# Patient Record
Sex: Female | Born: 1960 | Race: White | Hispanic: No | Smoking: Current some day smoker
Health system: Southern US, Community
[De-identification: ages and names within clinical notes are randomized; demographics above are authoritative.]

## PROBLEM LIST (undated history)

## (undated) DIAGNOSIS — Z972 Presence of dental prosthetic device (complete) (partial): Secondary | ICD-10-CM

## (undated) DIAGNOSIS — M25569 Pain in unspecified knee: Secondary | ICD-10-CM

## (undated) DIAGNOSIS — G8929 Other chronic pain: Secondary | ICD-10-CM

## (undated) DIAGNOSIS — M1990 Unspecified osteoarthritis, unspecified site: Secondary | ICD-10-CM

## (undated) DIAGNOSIS — F329 Major depressive disorder, single episode, unspecified: Secondary | ICD-10-CM

## (undated) DIAGNOSIS — K219 Gastro-esophageal reflux disease without esophagitis: Secondary | ICD-10-CM

## (undated) DIAGNOSIS — E785 Hyperlipidemia, unspecified: Secondary | ICD-10-CM

## (undated) DIAGNOSIS — Z973 Presence of spectacles and contact lenses: Secondary | ICD-10-CM

## (undated) DIAGNOSIS — F32A Depression, unspecified: Secondary | ICD-10-CM

## (undated) DIAGNOSIS — F419 Anxiety disorder, unspecified: Secondary | ICD-10-CM

## (undated) HISTORY — DX: Anxiety disorder, unspecified: F41.9

## (undated) HISTORY — DX: Major depressive disorder, single episode, unspecified: F32.9

## (undated) HISTORY — DX: Hyperlipidemia, unspecified: E78.5

## (undated) HISTORY — PX: TUBAL LIGATION: SHX77

## (undated) HISTORY — DX: Pain in unspecified knee: M25.569

## (undated) HISTORY — DX: Depression, unspecified: F32.A

## (undated) HISTORY — DX: Unspecified osteoarthritis, unspecified site: M19.90

---

## 2002-06-04 ENCOUNTER — Emergency Department (HOSPITAL_COMMUNITY): Admission: EM | Admit: 2002-06-04 | Discharge: 2002-06-04 | Payer: Self-pay | Admitting: Emergency Medicine

## 2002-06-04 ENCOUNTER — Encounter: Payer: Self-pay | Admitting: Emergency Medicine

## 2008-03-17 HISTORY — PX: TOTAL HIP ARTHROPLASTY: SHX124

## 2008-04-12 ENCOUNTER — Emergency Department (HOSPITAL_COMMUNITY): Admission: EM | Admit: 2008-04-12 | Discharge: 2008-04-12 | Payer: Self-pay | Admitting: Family Medicine

## 2010-08-05 ENCOUNTER — Inpatient Hospital Stay (INDEPENDENT_AMBULATORY_CARE_PROVIDER_SITE_OTHER)
Admission: RE | Admit: 2010-08-05 | Discharge: 2010-08-05 | Disposition: A | Discharge status: Home / Self Care | Payer: Medicaid Other | Source: Ambulatory Visit | Attending: Family Medicine | Admitting: Family Medicine

## 2010-08-05 DIAGNOSIS — J40 Bronchitis, not specified as acute or chronic: Secondary | ICD-10-CM

## 2010-08-16 ENCOUNTER — Inpatient Hospital Stay (INDEPENDENT_AMBULATORY_CARE_PROVIDER_SITE_OTHER)
Admission: RE | Admit: 2010-08-16 | Discharge: 2010-08-16 | Disposition: A | Discharge status: Home / Self Care | Payer: Medicaid Other | Source: Ambulatory Visit | Attending: Family Medicine | Admitting: Family Medicine

## 2010-08-16 DIAGNOSIS — Z760 Encounter for issue of repeat prescription: Secondary | ICD-10-CM

## 2010-08-16 DIAGNOSIS — M129 Arthropathy, unspecified: Secondary | ICD-10-CM

## 2010-09-24 ENCOUNTER — Other Ambulatory Visit: Payer: Self-pay | Admitting: Family Medicine

## 2010-09-24 DIAGNOSIS — M25512 Pain in left shoulder: Secondary | ICD-10-CM

## 2010-09-28 ENCOUNTER — Other Ambulatory Visit: Payer: Medicaid Other

## 2010-12-17 ENCOUNTER — Encounter: Payer: Medicaid Other | Attending: Physical Medicine & Rehabilitation | Admitting: Physical Medicine & Rehabilitation

## 2010-12-17 DIAGNOSIS — M7530 Calcific tendinitis of unspecified shoulder: Secondary | ICD-10-CM

## 2010-12-17 DIAGNOSIS — M25519 Pain in unspecified shoulder: Secondary | ICD-10-CM | POA: Insufficient documentation

## 2010-12-17 DIAGNOSIS — G8929 Other chronic pain: Secondary | ICD-10-CM | POA: Insufficient documentation

## 2010-12-17 DIAGNOSIS — M542 Cervicalgia: Secondary | ICD-10-CM | POA: Insufficient documentation

## 2010-12-17 DIAGNOSIS — IMO0001 Reserved for inherently not codable concepts without codable children: Secondary | ICD-10-CM

## 2010-12-17 DIAGNOSIS — M19019 Primary osteoarthritis, unspecified shoulder: Secondary | ICD-10-CM | POA: Insufficient documentation

## 2010-12-17 DIAGNOSIS — M1610 Unilateral primary osteoarthritis, unspecified hip: Secondary | ICD-10-CM

## 2010-12-17 DIAGNOSIS — M25559 Pain in unspecified hip: Secondary | ICD-10-CM | POA: Insufficient documentation

## 2010-12-17 DIAGNOSIS — Z981 Arthrodesis status: Secondary | ICD-10-CM | POA: Insufficient documentation

## 2010-12-20 ENCOUNTER — Ambulatory Visit (HOSPITAL_COMMUNITY)
Admission: RE | Admit: 2010-12-20 | Discharge: 2010-12-20 | Disposition: A | Discharge status: Home / Self Care | Payer: Medicaid Other | Source: Ambulatory Visit | Attending: Physical Medicine & Rehabilitation | Admitting: Physical Medicine & Rehabilitation

## 2010-12-20 ENCOUNTER — Other Ambulatory Visit: Payer: Self-pay | Admitting: Physical Medicine & Rehabilitation

## 2010-12-20 DIAGNOSIS — R52 Pain, unspecified: Secondary | ICD-10-CM

## 2010-12-20 DIAGNOSIS — Z96649 Presence of unspecified artificial hip joint: Secondary | ICD-10-CM | POA: Insufficient documentation

## 2010-12-20 DIAGNOSIS — M19019 Primary osteoarthritis, unspecified shoulder: Secondary | ICD-10-CM | POA: Insufficient documentation

## 2011-01-13 ENCOUNTER — Encounter: Payer: Medicaid Other | Attending: Neurosurgery | Admitting: Neurosurgery

## 2011-01-13 DIAGNOSIS — IMO0001 Reserved for inherently not codable concepts without codable children: Secondary | ICD-10-CM | POA: Insufficient documentation

## 2011-01-13 DIAGNOSIS — M25529 Pain in unspecified elbow: Secondary | ICD-10-CM

## 2011-01-13 DIAGNOSIS — M542 Cervicalgia: Secondary | ICD-10-CM | POA: Insufficient documentation

## 2011-01-13 DIAGNOSIS — Z96649 Presence of unspecified artificial hip joint: Secondary | ICD-10-CM | POA: Insufficient documentation

## 2011-01-13 DIAGNOSIS — M19019 Primary osteoarthritis, unspecified shoulder: Secondary | ICD-10-CM | POA: Insufficient documentation

## 2011-01-13 DIAGNOSIS — M25559 Pain in unspecified hip: Secondary | ICD-10-CM | POA: Insufficient documentation

## 2011-01-13 DIAGNOSIS — M25519 Pain in unspecified shoulder: Secondary | ICD-10-CM | POA: Insufficient documentation

## 2011-01-14 NOTE — Assessment & Plan Note (Signed)
This is patient of Dr. Riley Kill, is a new patient that he has started on hydrocodone.  She is reporting very little improvement with that.  Her last appointment 2 weeks ago with him, he did a Kenalog injection in the shoulder as well as some trigger point injections.  She came back today wanting another shoulder injection.  I explained to her that it was a little soon to inject the shoulder again as far as the joint injection, we could do some trigger points.  She reports little or no improvement with either one, but he did.  She is also concerned about her hip pain. She rates her average pain as 6-7.  It is a sharp, burning, stabbing, aching pain.  General activity level is 8-10.  Pain is same 24 hours a day.  She does not indicate what aggravates or helps her pain.  She uses a cane for ambulation.  She climb steps and drives.  Functionally, there has been no change.  REVIEW OF SYSTEMS:  She does not indicate any problems other than ones described above.  Past medical history, social history, family history unchanged.  PHYSICAL EXAMINATION:  VITAL SIGNS:  Blood pressure is 120/83, pulse 70 ,respirations 18, O2 sats 98 on room air. MUSCULOSKELETAL:  Lower extremity strength is about 4/5.  Resistance testing, sensation is intact.  Her left shoulder, gives away the pain in every direction, passive range of motion because of pain.  ASSESSMENT: 1. 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 2. Left shoulder pain, questionable rotator cuff tear or bursitis.     She did get x-rays that were fairly benign of the shoulder with the     exception of some mild osteoarthritis.  She has myofascial pain in     cervical spine all the way over to her left shoulder.  She is point     tender everywhere she was touched.  PLAN:  No prescriptions were given, but after informed consent and proper time-out, Betadine prepped and alcohol cleaned, the rhomboids just left to the cervical thoracic junction  and an area just subscapular on the left side.  I injected 4 areas with 1% lidocaine 1 mL each.  She tolerated it well.  There was no bleeding.  There was no shoulder injection.  Her questions were encouraged and answered.  She states she has an appointment with Dr. Riley Kill a little over 2 weeks and she will follow up with him and for possible referral to Orthopedics.     Caydn Justen L. Blima Dessert Electronically Signed    RLW/MedQ D:  01/13/2011 15:57:47  T:  01/14/2011 01:39:23  Job #:  [id number]

## 2011-01-28 ENCOUNTER — Encounter: Payer: Medicaid Other | Attending: Physical Medicine & Rehabilitation | Admitting: Physical Medicine & Rehabilitation

## 2011-01-28 DIAGNOSIS — M542 Cervicalgia: Secondary | ICD-10-CM | POA: Insufficient documentation

## 2011-01-28 DIAGNOSIS — M19029 Primary osteoarthritis, unspecified elbow: Secondary | ICD-10-CM

## 2011-01-28 DIAGNOSIS — M25559 Pain in unspecified hip: Secondary | ICD-10-CM | POA: Insufficient documentation

## 2011-01-28 DIAGNOSIS — M7530 Calcific tendinitis of unspecified shoulder: Secondary | ICD-10-CM

## 2011-01-28 DIAGNOSIS — F411 Generalized anxiety disorder: Secondary | ICD-10-CM | POA: Insufficient documentation

## 2011-01-28 DIAGNOSIS — F3289 Other specified depressive episodes: Secondary | ICD-10-CM | POA: Insufficient documentation

## 2011-01-28 DIAGNOSIS — M25519 Pain in unspecified shoulder: Secondary | ICD-10-CM | POA: Insufficient documentation

## 2011-01-28 DIAGNOSIS — M67919 Unspecified disorder of synovium and tendon, unspecified shoulder: Secondary | ICD-10-CM | POA: Insufficient documentation

## 2011-01-28 DIAGNOSIS — M719 Bursopathy, unspecified: Secondary | ICD-10-CM | POA: Insufficient documentation

## 2011-01-28 DIAGNOSIS — F329 Major depressive disorder, single episode, unspecified: Secondary | ICD-10-CM | POA: Insufficient documentation

## 2011-01-28 DIAGNOSIS — IMO0001 Reserved for inherently not codable concepts without codable children: Secondary | ICD-10-CM | POA: Insufficient documentation

## 2011-01-28 DIAGNOSIS — M47812 Spondylosis without myelopathy or radiculopathy, cervical region: Secondary | ICD-10-CM

## 2011-01-28 DIAGNOSIS — M19019 Primary osteoarthritis, unspecified shoulder: Secondary | ICD-10-CM | POA: Insufficient documentation

## 2011-01-28 NOTE — Assessment & Plan Note (Signed)
Summer Estrada is back regarding multiple pain complaints.  She saw my nurse practitioner last month who injected the rhomboids on the left side with trigger point injections.  She had minimal results with these.  She did state that she had positive results with the trigger point injection at the supraspinous and the left subacromial injection we performed at the beginning of the month.  She had her x-rays done and left hip replacement was intact.  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  Pain today is 8 to 9/10 and most prominent in the neck into the upper shoulder region.  She has lot of pain anteriorly in the left shoulder. Neck is most painful with extension.  Sleep is fair.  Denies any tingling or numbness in the arms.  Left hip is still tender with prolonged sitting.  REVIEW OF SYSTEMS:  Notable for depression and anxiety.  Full 12-point review is in the written health and history section of the chart.  She also states the Lortab causes nausea and she could continue this.  SOCIAL HISTORY:  Unchanged.  She still smokes.  She lives with husband who travels.  PHYSICAL EXAMINATION:  Blood pressure is 145/83, pulse 81, respiratory rate 18, she is saturating 100% on room air.  The patient is pleasant, alert.  She has fairly intact strength in the upper extremities with some pain inhibition proximally at the left shoulder.  She has definite pain in the left acromioclavicular joint.  Rotator cuff testing was improved.  She had some tenderness at the left supraspinatus and trigger points palpated.  Cervical flexion did not cause pain where as extension and facet maneuvers did provoke pain on the left side.  Rhomboids were not as tender today.  Standing and sitting posture was fair.  She did have some problems with left hip pain in sitting as well as left low back pain.  Left low back was generally tender to touch today with diminished flexion and  extension.  ASSESSMENT: 1. 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  There is likely a cervical     facet component as well.  Fascial pain is also a problem there. 2. Persistent left hip pain.  PLAN: 1. We will set the patient up for MRI of the neck to the examin her     disks and facets. 2. After informed consent, we injected the left AC joint with 40 mg of     Kenalog and 2 mL of 1% lidocaine.  Area was prepped with betadine.     The patient tolerated it well.  I advised ice and continued use of     her meloxicam. 3. We discontinued Lortab and trial of Ultram 50 mg 1 q.6 h. p.r.n.,     #60 for breakthrough pain. 4. Injected the left supraspinatus trigger point today after informed     consent and prep, the patient tolerated well. 5. I will see the patient back here pending MRI study above.     Ranelle Oyster, M.D. Electronically Signed    ZTS/MedQ D:  01/28/2011 11:13:46  T:  01/28/2011 11:40:27  Job #:  [id number]  cc:   Maryelizabeth Rowan, M.D. Fax: [phone]

## 2011-02-12 ENCOUNTER — Other Ambulatory Visit: Payer: Self-pay | Admitting: Physical Medicine & Rehabilitation

## 2011-02-12 DIAGNOSIS — M47812 Spondylosis without myelopathy or radiculopathy, cervical region: Secondary | ICD-10-CM

## 2011-02-12 DIAGNOSIS — M542 Cervicalgia: Secondary | ICD-10-CM

## 2011-02-14 ENCOUNTER — Ambulatory Visit (HOSPITAL_COMMUNITY)
Admission: RE | Admit: 2011-02-14 | Discharge: 2011-02-14 | Disposition: A | Discharge status: Home / Self Care | Payer: Medicaid Other | Source: Ambulatory Visit | Attending: Physical Medicine & Rehabilitation | Admitting: Physical Medicine & Rehabilitation

## 2011-02-14 DIAGNOSIS — M47812 Spondylosis without myelopathy or radiculopathy, cervical region: Secondary | ICD-10-CM | POA: Insufficient documentation

## 2011-02-14 DIAGNOSIS — M542 Cervicalgia: Secondary | ICD-10-CM | POA: Insufficient documentation

## 2011-02-14 DIAGNOSIS — M4802 Spinal stenosis, cervical region: Secondary | ICD-10-CM | POA: Insufficient documentation

## 2011-02-14 DIAGNOSIS — M79609 Pain in unspecified limb: Secondary | ICD-10-CM | POA: Insufficient documentation

## 2011-02-14 DIAGNOSIS — M5020 Other cervical disc displacement, unspecified cervical region: Secondary | ICD-10-CM | POA: Insufficient documentation

## 2011-03-12 ENCOUNTER — Encounter: Payer: Medicaid Other | Attending: Physical Medicine & Rehabilitation | Admitting: Physical Medicine & Rehabilitation

## 2011-03-28 ENCOUNTER — Encounter: Payer: Medicaid Other | Attending: Physical Medicine & Rehabilitation | Admitting: Physical Medicine & Rehabilitation

## 2011-03-28 DIAGNOSIS — M5020 Other cervical disc displacement, unspecified cervical region: Secondary | ICD-10-CM | POA: Insufficient documentation

## 2011-03-28 DIAGNOSIS — M542 Cervicalgia: Secondary | ICD-10-CM | POA: Insufficient documentation

## 2011-03-28 DIAGNOSIS — M1610 Unilateral primary osteoarthritis, unspecified hip: Secondary | ICD-10-CM

## 2011-03-28 DIAGNOSIS — M47812 Spondylosis without myelopathy or radiculopathy, cervical region: Secondary | ICD-10-CM

## 2011-03-28 DIAGNOSIS — M25559 Pain in unspecified hip: Secondary | ICD-10-CM | POA: Insufficient documentation

## 2011-03-28 DIAGNOSIS — M7530 Calcific tendinitis of unspecified shoulder: Secondary | ICD-10-CM

## 2011-03-28 DIAGNOSIS — M719 Bursopathy, unspecified: Secondary | ICD-10-CM | POA: Insufficient documentation

## 2011-03-28 DIAGNOSIS — M67919 Unspecified disorder of synovium and tendon, unspecified shoulder: Secondary | ICD-10-CM | POA: Insufficient documentation

## 2011-03-29 NOTE — Assessment & Plan Note (Signed)
Rodneisha is back regarding her multiple pain complaints.  We had an MRI ordered of her neck at last visit, which should spondylosis predominantly at the C4 through C7.  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  There is no definite nerve root impingement, however.  The injection we performed at the [facility] joint was helpful for anterior shoulder pain, but she is still having neck and shoulder blade pain and pain into the lateral arm.  It seems to be most painful with activities, particularly walking, standing and overhead use of the hands and arms.  She rates her pain 8-10/10. The tramadol was not helpful for breakthrough symptoms.  She is not taking a muscle relaxant at this point.  Apparently, she did not get the prescription.  REVIEW OF SYSTEMS:  Notable for the above.  Full 12-point review is in the written health and history section of the chart.  SOCIAL HISTORY:  Unchanged.  PHYSICAL EXAMINATION:  VITAL SIGNS:  Blood pressure is 140/84, pulse 85, respiratory rate is 16 and she is satting 100% on room air. GENERAL:  The patient is pleasant and alert. BACK:  Left shoulder is tender with rotator cuff impingement maneuvers. Labral testing Raiford Noble some pain as well and she had pain at the first subacromial bursa.  Strength is generally intact.  Spurling test was equivocal to negative and really did not provoke the complaints that she is having.  Facet provocative maneuver seemed to be more positive today. Posture was fair. HEART:  Regular. CHEST:  Clear. ABDOMEN:  Soft and nontender.  ASSESSMENT: 1. Chronic cervicalgia with rotator cuff syndrome. 2. Cervical spondylosis. 3. Persistent left hip pain.  PLAN: 1. I feel that the left rotator cuff is the biggest contributor to her     symptoms at this point although her C-spine is playing a role as     well.  She also may have some labral issues as well as bursitis     contributing as  well. After informed consent, we injected the left     shoulder via lateral approach with 40 mg of Kenalog and 3 mL of 1%     lidocaine.  The patient tolerated well. 2. We changed her Ultram to Norco 5/325, 1 q.6 h. p.r.n., #60. 3. We will try Robaxin 500 mg 1 q.8 h. p.r.n. #60. 4. Discussed range of motion exercises at home. 5. We will follow up with her in about a month to further determine     her treatment plan.  She might benefit from therapy to the left     shoulder also.  Consider MRI as well.     Ranelle Oyster, M.D. Electronically Signed    ZTS/MedQ D:  03/28/2011 10:13:07  T:  03/29/2011 02:07:23  Job #:  [id number]  cc:   Maryelizabeth Rowan, M.D. Fax: [phone]

## 2011-04-08 ENCOUNTER — Ambulatory Visit: Payer: Medicaid Other | Admitting: Physical Medicine & Rehabilitation

## 2011-04-28 ENCOUNTER — Encounter: Payer: Medicaid Other | Attending: Physical Medicine & Rehabilitation | Admitting: Physical Medicine & Rehabilitation

## 2011-04-28 DIAGNOSIS — M719 Bursopathy, unspecified: Secondary | ICD-10-CM | POA: Insufficient documentation

## 2011-04-28 DIAGNOSIS — M25569 Pain in unspecified knee: Secondary | ICD-10-CM | POA: Insufficient documentation

## 2011-04-28 DIAGNOSIS — M25519 Pain in unspecified shoulder: Secondary | ICD-10-CM | POA: Insufficient documentation

## 2011-04-28 DIAGNOSIS — M1710 Unilateral primary osteoarthritis, unspecified knee: Secondary | ICD-10-CM

## 2011-04-28 DIAGNOSIS — IMO0001 Reserved for inherently not codable concepts without codable children: Secondary | ICD-10-CM

## 2011-04-28 DIAGNOSIS — M542 Cervicalgia: Secondary | ICD-10-CM | POA: Insufficient documentation

## 2011-04-28 DIAGNOSIS — M47812 Spondylosis without myelopathy or radiculopathy, cervical region: Secondary | ICD-10-CM | POA: Insufficient documentation

## 2011-04-28 DIAGNOSIS — M7530 Calcific tendinitis of unspecified shoulder: Secondary | ICD-10-CM

## 2011-04-28 DIAGNOSIS — M67919 Unspecified disorder of synovium and tendon, unspecified shoulder: Secondary | ICD-10-CM | POA: Insufficient documentation

## 2011-04-28 NOTE — Assessment & Plan Note (Signed)
Summer Estrada is back regarding her neck pain and shoulder pain.  She had good results with the left subacromial injection.  Neck remains a problem, however.  Her pain is 0 to 4 out of 10, worse when she is at the computer for long periods of time.  She also has problems sleeping at night.  Sleep was rated as poor overall.  The Robaxin is not helping her, however, the hydrocodone seems to help with breakthrough symptoms.  REVIEW OF SYSTEMS:  Notable for the above.  Full 12-point review is in the written health and history section of the chart.  SOCIAL HISTORY:  Unchanged.  PHYSICAL EXAMINATION:  VITAL SIGNS:  Blood pressure is 106/56, pulse is 80, respiratory rate is 16, she is satting 98% on room air. GENERAL:  The patient is pleasant, alert. MUSCULOSKELETAL:  Posture is fair, though she sits with a head forward position.  The left rotator cuff was mildly tender with provocation today.  She had tenderness over the levator scapula, mid trapezius, and upper rhomboids on the left with palpation today.  There is significant tightness and tenderness in the mid trapezius.  Motor and sensory exam grossly intact in all 4 limbs.  Spurling test was equivocal.  Facet provocation was equivocal to positive. HEART:  Regular. CHEST:  Clear. ABDOMEN:  Soft, nontender.  ASSESSMENT: 1. Chronic cervicalgia with rotator cuff syndrome. 2. Left shoulder girdle myofascial pain. 3. Cervical spondylosis. 4. New onset, right knee pain.  PLAN: 1. After informed consent, I injected 3 trigger points including left     trapezius, levator scapula, and rhomboids each with 2 mL of 1%     lidocaine.  The patient tolerated well. 2. We will send her to therapy to work on posture modalities,     stretching, strengthening, etc.  She really needs to be proactive     regarding her posture and positioning. 3. Change Robaxin to [facility] for muscle relaxer. 4. She will stay with Norco for breakthrough pain. 5. I will see her  back here in about 2 month's time.  We will hold off     on any knee interventions at this point.     Ranelle Oyster, M.D. Electronically Signed    ZTS/MedQ D:  04/28/2011 12:22:00  T:  04/28/2011 23:12:21  Job #:  [id number]

## 2011-05-09 ENCOUNTER — Other Ambulatory Visit: Payer: Self-pay | Admitting: Physical Medicine & Rehabilitation

## 2011-05-09 ENCOUNTER — Ambulatory Visit: Payer: Medicaid Other | Admitting: Physical Therapy

## 2011-05-12 ENCOUNTER — Other Ambulatory Visit: Payer: Self-pay | Admitting: *Deleted

## 2011-05-12 MED ORDER — NAPROXEN 500 MG PO TABS: 500.0000 mg | ORAL_TABLET | Freq: Two times a day (BID) | ORAL | Status: DC

## 2011-05-14 ENCOUNTER — Ambulatory Visit
Payer: Medicaid Other | Attending: Physical Medicine & Rehabilitation | Admitting: Rehabilitative and Restorative Service Providers"

## 2011-05-14 DIAGNOSIS — M542 Cervicalgia: Secondary | ICD-10-CM | POA: Insufficient documentation

## 2011-05-14 DIAGNOSIS — IMO0001 Reserved for inherently not codable concepts without codable children: Secondary | ICD-10-CM | POA: Insufficient documentation

## 2011-05-14 DIAGNOSIS — M25519 Pain in unspecified shoulder: Secondary | ICD-10-CM | POA: Insufficient documentation

## 2011-05-16 ENCOUNTER — Telehealth: Payer: Self-pay | Admitting: Physical Medicine & Rehabilitation

## 2011-05-16 MED ORDER — HYDROCODONE-ACETAMINOPHEN 5-325 MG PO TABS: 1.0000 | ORAL_TABLET | Freq: Three times a day (TID) | ORAL | Status: DC | PRN

## 2011-05-16 NOTE — Telephone Encounter (Signed)
Rx called in. Pt aware

## 2011-05-16 NOTE — Telephone Encounter (Signed)
Started PT .  Needs hydrocodone refill.  Doesn't know if insurance will cover all of PT.

## 2011-05-21 ENCOUNTER — Encounter: Payer: Medicaid Other | Admitting: Physical Therapy

## 2011-05-23 ENCOUNTER — Encounter: Payer: Medicaid Other | Admitting: Physical Therapy

## 2011-05-26 ENCOUNTER — Telehealth: Payer: Self-pay | Admitting: Physical Medicine & Rehabilitation

## 2011-05-26 MED ORDER — OXYCODONE-ACETAMINOPHEN 5-325 MG PO TABS: 1.0000 | ORAL_TABLET | Freq: Three times a day (TID) | ORAL | Status: AC | PRN

## 2011-05-26 MED ORDER — TIZANIDINE HCL 4 MG PO TABS: 2.0000 mg | ORAL_TABLET | Freq: Four times a day (QID) | ORAL | Status: AC | PRN

## 2011-05-26 NOTE — Telephone Encounter (Signed)
Can you give her something else for pain?  It will just not go away now. Doesn't know what has happened, hasn't hurt herself.  What else can she do?

## 2011-05-26 NOTE — Telephone Encounter (Signed)
Added oxycodone and zanaflex.  Dc'ed robaxin and hydrocodone

## 2011-05-26 NOTE — Telephone Encounter (Signed)
Please advise. Pt is currently taking Hydrocodone and Robaxin.

## 2011-05-26 NOTE — Telephone Encounter (Signed)
Pt aware.

## 2011-05-28 ENCOUNTER — Ambulatory Visit
Payer: Medicaid Other | Attending: Physical Medicine & Rehabilitation | Admitting: Rehabilitative and Restorative Service Providers"

## 2011-05-28 DIAGNOSIS — M542 Cervicalgia: Secondary | ICD-10-CM | POA: Insufficient documentation

## 2011-05-28 DIAGNOSIS — M25519 Pain in unspecified shoulder: Secondary | ICD-10-CM | POA: Insufficient documentation

## 2011-05-28 DIAGNOSIS — IMO0001 Reserved for inherently not codable concepts without codable children: Secondary | ICD-10-CM | POA: Insufficient documentation

## 2011-06-04 ENCOUNTER — Ambulatory Visit: Payer: Medicaid Other | Admitting: Rehabilitative and Restorative Service Providers"

## 2011-06-17 ENCOUNTER — Telehealth: Payer: Self-pay | Admitting: *Deleted

## 2011-06-17 NOTE — Telephone Encounter (Signed)
I'm not sure if you were asking me what # means or what was the number ordered--which is #60 ordered on 05/26/11.  I placed in the med list, the tizanidine and oxycodone because it was not there in the chart, even though I could see you ordered it on 3/11.  FYI, if you place an order for a med, esp narcotic and there is an end date populated in the order, the medication will drop off their list when that date is reached.  The order you placed for oxy and zanaflex on 05/26/11 had an end date of 06/05/11 and did drop off her med list.  If you go to medications in the blue tabs on left and click the "history tab" above "prescription summary" , You can see that there was an end date.  If there is an end date populated when you order a med, you have to delete it, if med is ongoing.

## 2011-06-17 NOTE — Telephone Encounter (Signed)
What is

## 2011-06-17 NOTE — Telephone Encounter (Signed)
Refill Oxycodone. She knows she is out early.  (Was ordered Percocet 5/325  05/26/2011 #60, 1 q 8 hrs prn)

## 2011-06-18 MED ORDER — OXYCODONE-ACETAMINOPHEN 5-325 MG PO TABS: 1.0000 | ORAL_TABLET | Freq: Three times a day (TID) | ORAL | Status: DC | PRN

## 2011-06-18 NOTE — Telephone Encounter (Signed)
LVM for Summer Estrada informing her that her RX is available for pick up at front desk.

## 2011-06-18 NOTE — Telephone Encounter (Signed)
Thanks, done.

## 2011-06-23 ENCOUNTER — Encounter: Payer: Self-pay | Admitting: Physical Medicine & Rehabilitation

## 2011-06-23 ENCOUNTER — Encounter: Payer: Medicaid Other | Attending: Physical Medicine & Rehabilitation | Admitting: Physical Medicine & Rehabilitation

## 2011-06-23 ENCOUNTER — Ambulatory Visit (HOSPITAL_COMMUNITY)
Admission: RE | Admit: 2011-06-23 | Discharge: 2011-06-23 | Disposition: A | Discharge status: Home / Self Care | Payer: Medicaid Other | Source: Ambulatory Visit | Attending: Physical Medicine & Rehabilitation | Admitting: Physical Medicine & Rehabilitation

## 2011-06-23 VITALS — BP 108/66 | HR 70 | Resp 16 | Ht 63.0 in | Wt 180.0 lb

## 2011-06-23 DIAGNOSIS — M25469 Effusion, unspecified knee: Secondary | ICD-10-CM | POA: Insufficient documentation

## 2011-06-23 DIAGNOSIS — M47812 Spondylosis without myelopathy or radiculopathy, cervical region: Secondary | ICD-10-CM | POA: Insufficient documentation

## 2011-06-23 DIAGNOSIS — M170 Bilateral primary osteoarthritis of knee: Secondary | ICD-10-CM

## 2011-06-23 DIAGNOSIS — IMO0002 Reserved for concepts with insufficient information to code with codable children: Secondary | ICD-10-CM | POA: Insufficient documentation

## 2011-06-23 DIAGNOSIS — M7650 Patellar tendinitis, unspecified knee: Secondary | ICD-10-CM

## 2011-06-23 DIAGNOSIS — IMO0001 Reserved for inherently not codable concepts without codable children: Secondary | ICD-10-CM | POA: Insufficient documentation

## 2011-06-23 DIAGNOSIS — M1710 Unilateral primary osteoarthritis, unspecified knee: Secondary | ICD-10-CM | POA: Insufficient documentation

## 2011-06-23 DIAGNOSIS — M7530 Calcific tendinitis of unspecified shoulder: Secondary | ICD-10-CM | POA: Insufficient documentation

## 2011-06-23 MED ORDER — CARISOPRODOL 350 MG PO TABS: 350.0000 mg | ORAL_TABLET | Freq: Three times a day (TID) | ORAL | Status: DC | PRN

## 2011-06-23 NOTE — Patient Instructions (Signed)
ICE THREE X PER DAY TO KNEES AND USE VOLTAREN GEL 4 X PER DAY     Patellar Tendinitis, Jumper's Knee with Rehab Tendinitis is inflammation of a tendon. Tendonitis of the tendon below the kneecap (patella) is known as patellar tendonitis. Patellar tendonitis is a common cause of pain below the kneecap (infrapatella). Patellar tendonitis may involve a tear (strain) in the ligament. Strains are classified into three categories. Grade 1 strains cause pain, but the tendon is not lengthened. Grade 2 strains include a lengthened ligament, due to the ligament being stretched or partially ruptured. With grade 2 strains there is still function, although function may be decreased. Grade 3 strains involve a complete tear of the tendon or muscle, and function is usually impaired. Patellar tendon strains are usually grade 1 or 2.  SYMPTOMS   Pain, tenderness, swelling, warmth, or redness over the patellar tendon (just below the kneecap).   Pain and loss of strength (sometimes), with forcefully straightening the knee (especially when jumping or rising from a seated or squatting position), or bending the knee completely (squatting or kneeling).   Crackling sound (crepitation) when the tendon is moved or touched.  CAUSES  Patellar tendonitis is caused by injury to the patellar tendon. The inflammation is the body's healing response. Common causes of injury include:  Stress from a sudden increase in intensity, frequency, or duration of training.   Overuse of the quadriceps thigh muscles and patellar tendon.   Direct hit (trauma) to the knee or patellar tendon.  RISK INCREASES WITH:  Sports that require sudden, explosive thigh muscle (quadriceps) contraction, such as jumping, quick starts, or kicking.   Running sports, especially running down hills.   Poor strength and flexibility of the thigh and knee.   Flat feet.  PREVENTION  Warm up and stretch properly before activity.   Allow for adequate  recovery between workouts.   Maintain physical fitness:   Strength, flexibility, and endurance.   Cardiovascular fitness.   Protect the knee joint with taping, protective strapping, bracing, or elastic compression bandage.   Wear arch supports (orthotics).  PROGNOSIS  If treated properly, patellar tendonitis usually heals within 6 weeks.  RELATED COMPLICATIONS   Longer healing time, if not properly treated or if not given enough time to heal.   Recurring symptoms, if activity is resumed too soon, with overuse, with a direct blow, or when using poor technique.   If untreated, tendon rupture requiring surgery.  TREATMENT Treatment first involves the use of ice and medicine, to reduce pain and inflammation. The use of strengthening and stretching exercises may help reduce pain with activity. These exercises may be performed at home or with a therapist. Serious cases of tendonitis may require restraining the knee for 10 to 14 days, to prevent stress on the tendon and to promote healing. Crutches may be used (uncommon) until you can walk without a limp. For cases in which non-surgical treatment is unsuccessful, surgery may be advised, to remove the inflamed tendon lining (sheath). Surgery is rare, and is only advised after at least 6 months of non-surgical treatment. MEDICATION   If pain medicine is needed, nonsteroidal anti-inflammatory medicines (aspirin and ibuprofen), or other minor pain relievers (acetaminophen), are often advised.   Do not take pain medicine for 7 days before surgery.   Prescription pain relievers may be given, if your caregiver thinks they are needed. Use only as directed and only as much as you need.  HEAT AND COLD  Cold treatment (icing)  should be applied for 10 to 15 minutes every 2 to 3 hours for inflammation and pain, and immediately after activity that aggravates your symptoms. Use ice packs or an ice massage.   Heat treatment may be used before performing  stretching and strengthening activities prescribed by your caregiver, physical therapist, or athletic trainer. Use a heat pack or a warm water soak.  SEEK MEDICAL CARE IF:  Symptoms get worse or do not improve in 2 weeks, despite treatment.   New, unexplained symptoms develop. (Drugs used in treatment may produce side effects.)  EXERCISES RANGE OF MOTION (ROM) AND STRETCHING EXERCISES - Patellar Tendinitis (Jumper's Knee) These are some of the initial exercises with which you may start your rehabilitation program, until you see your caregiver again or until your symptoms are resolved. Remember:   Flexible tissue is more tolerant of the stresses placed on it during activities.   Each stretch should be held for 20 to 30 seconds.   A gentle stretching sensation should be felt.  STRETCH - Hamstrings, Supine  Lie on your back. Loop a belt or towel over the ball of your right / left foot.   Straighten your right / left knee and slowly pull on the belt to raise your leg. Do not allow the right / left knee to bend. Keep your opposite leg flat on the floor.   Raise the leg until you feel a gentle stretch behind your right / left knee or thigh. Hold this position for __________ seconds.  Repeat __________ times. Complete this stretch __________ times per day.  STRETCH - Hamstrings, Doorway  Lie on your back with your right / left leg extended and resting on the wall, and the opposite leg flat on the ground through the door. At first, position your bottom farther away from the wall.   Keep your right / left knee straight. If you feel a stretch behind your knee or thigh, hold this position for __________ seconds.   If you do not feel a stretch, scoot your bottom closer to the door, and hold __________ seconds.  Repeat __________ times. Complete this stretch __________ times per day.  STRETCH - Hamstrings, Standing  Stand or sit and extend your right / left leg, placing your foot on a chair or  foot stool.   Keep a slight arch in your low back and your hips straight forward.   Lead with your chest and lean forward at the waist until you feel a gentle stretch in the back of your right / left knee or thigh. (When done correctly, this exercise requires leaning only a small distance.)   Hold this position for __________ seconds.  Repeat __________ times. Complete this stretch __________ times per day. STRETCH - Adductors, Lunge  While standing, spread your legs, with your right / left leg behind you.   Lean away from your right / left leg by bending your opposite knee. You may rest your hands on your thigh for balance.   You should feel a stretch in your right / left inner thigh. Hold for __________ seconds.  Repeat __________ times. Complete this exercise __________ times per day.  STRENGTHENING EXERCISES - Patellar Tendinitis (Jumper's Knee) These exercises may help you when beginning to rehabilitate your injury. They may resolve your symptoms with or without further involvement from your physician, physical therapist or athletic trainer. While completing these exercises, remember:   Muscles can gain both the endurance and the strength needed for everyday activities through controlled  exercises.   Complete these exercises as instructed by your physician, physical therapist or athletic trainer. Increase the resistance and repetitions only as guided by your caregiver.  STRENGTH - Quadriceps, Isometrics  Lie on your back with your right / left leg extended and your opposite knee bent.   Gradually tense the muscles in the front of your right / left thigh. You should see either your kneecap slide up toward your hip or increased dimpling just above the knee. This motion will push the back of the knee down toward the floor, mat, or bed on which you are lying.   Hold the muscle as tight as you can, without increasing your pain, for __________ seconds.   Relax the muscles slowly and  completely in between each repetition.  Repeat __________ times. Complete this exercise __________ times per day.  STRENGTH - Quadriceps, Short Arcs  Lie on your back. Place a __________ inch towel roll under your right / left knee, so that the knee bends slightly.   Raise only your lower leg by tightening the muscles in the front of your thigh. Do not allow your thigh to rise.   Hold this position for __________ seconds.  Repeat __________ times. Complete this exercise __________ times per day.  OPTIONAL ANKLE WEIGHTS: Begin with ____________________, but DO NOT exceed ____________________. Increase in 1 pound/ 0.5 kilogram increments. STRENGTH - Quadriceps, Straight Leg Raises  Quality counts! Watch for signs that the quadriceps muscle is working, to be sure you are strengthening the correct muscles and not "cheating" by substituting with healthier muscles.  Lay on your back with your right / left leg extended and your opposite knee bent.   Tense the muscles in the front of your right / left thigh. You should see either your kneecap slide up or increased dimpling just above the knee. Your thigh may even shake a bit.   Tighten these muscles even more and raise your leg 4 to 6 inches off the floor. Hold for __________ seconds.   Keeping these muscles tense, lower your leg.   Relax the muscles slowly and completely between each repetition.  Repeat __________ times. Complete this exercise __________ times per day.  STRENGTH - Quadriceps, Squats  Stand in a door frame so that your feet and knees are in line with the frame.   Use your hands for balance, not support, on the frame.   Slowly lower your weight, bending at the hips and knees. Keep your lower legs upright so that they are parallel with the door frame. Squat only within the range that does not increase your knee pain. Never let your hips drop below your knees.   Slowly return upright, pushing with your legs, not pulling with  your hands.  Repeat __________ times. Complete this exercise __________ times per day.  STRENGTH - Quadriceps, Step-Downs  Stand on the edge of a step stool or stair. Be prepared to use a countertop or wall for balance, if needed.   Keeping your right / left knee directly over the middle of your foot, slowly touch your opposite heel to the floor or lower step. Do not go all the way to the floor if your knee pain increases, just go as far as you can without increased discomfort. Use your right / left leg muscles, not gravity to lower your body weight.   Slowly push your body weight back up to the starting position,  Repeat __________ times. Complete this exercise __________ times per day.  Document Released: 03/03/2005 Document Revised: 02/20/2011 Document Reviewed: 06/15/2008 [facility] Patient Information 2012 [place], [place].

## 2011-06-23 NOTE — Progress Notes (Signed)
  Subjective:    Patient ID: Summer Estrada, female    DOB: 1960/12/17, 51 y.o.   MRN: [id number]  HPI Summer Estrada is back regarding her neck, shoulder, and knee pain.  The injections and therapy have been helpful.    Her knees are the big problem at this point.  She's been trying to walk, but can only go about a half a mile before they really hurt. They are swollen.  She has a hard time to extending the knees in the AM also. They are difficult to stand on when she sits in the car or a chair for more than 20 minutes. She doesn't feel that NSAIDS have helped.  The percocet helps somewhat for the pain.  She hasn't tried ice or knee sleeves/bracing. Stairs are particularly hard. She notes some grinding in the knee.    Pain Inventory Average Pain 8 Pain Right Now 5 My pain is sharp, stabbing and aching  In the last 24 hours, has pain interfered with the following? General activity 5 Relation with others 5 Enjoyment of life 6 What TIME of day is your pain at its worst? Daytime and Evening Sleep (in general) Poor  Pain is worse with: walking, bending and standing Pain improves with: rest Relief from Meds: 5  Mobility walk with assistance use a cane ability to climb steps?  no do you drive?  yes  Function Do you have any goals in this area?  yes  Neuro/Psych No problems in this area  Prior Studies Any changes since last visit?  no  Physicians involved in your care Any changes since last visit?  no  Review of Systems  Constitutional: Positive for unexpected weight change (Weight Gain).  HENT: Negative.   Eyes: Negative.   Respiratory: Negative.   Cardiovascular: Negative.   Gastrointestinal: Negative.   Genitourinary: Negative.   Musculoskeletal: Negative.   Skin: Negative.   Neurological: Negative.   Hematological: Bruises/bleeds easily.       Objective:   Physical Exam  Constitutional: She appears well-developed and well-nourished.  HENT:  Head: Normocephalic  and atraumatic.  Eyes: Conjunctivae and EOM are normal. Pupils are equal, round, and reactive to light.  Neck: Normal range of motion. Neck supple.  Cardiovascular: Normal rate.   Pulmonary/Chest: Effort normal.  Abdominal: Soft.  Musculoskeletal:       Right knee: She exhibits effusion and bony tenderness. She exhibits normal alignment, no LCL laxity and normal meniscus. tenderness found. Patellar tendon tenderness noted. No MCL and no LCL tenderness noted.       Left knee: She exhibits effusion, abnormal patellar mobility and bony tenderness. She exhibits normal meniscus. tenderness found. Patellar tendon tenderness noted. No medial joint line and no lateral joint line tenderness noted.       Crepitus at both knees with anterior compartment pain.           Assessment & Plan:  ASSESSMENT:  1. Chronic cervicalgia with rotator cuff syndrome.  2. Left shoulder girdle myofascial pain.  3. Cervical spondylosis.  4. Bilateral knee pain.  Appears to be patellar tendonitis, bursitis. May have anterior compartment arthritis.  PLAN:  1. Will send for xray of both knees  2. Voltaren gel to knees qid, also use ice 3.  Soma for muscle relaxer.  4. Percocet for breakthrough pain was just refilled  5. Knee exercises were provided. 6. Continue maintenance exercises for shoulder 7. Consider knee injections if pain is persistent.

## 2011-06-24 ENCOUNTER — Telehealth: Payer: Self-pay | Admitting: Physical Medicine & Rehabilitation

## 2011-06-24 ENCOUNTER — Encounter: Payer: Self-pay | Admitting: Physical Medicine & Rehabilitation

## 2011-06-24 NOTE — Telephone Encounter (Signed)
Notified Ms Califano of Dr Rosalyn Charters response concerning xray results.

## 2011-06-24 NOTE — Telephone Encounter (Signed)
Bursitis and anterior compartment arthritis as we suspected on both knee xrays.  Continue with current plan for now.

## 2011-07-08 ENCOUNTER — Telehealth: Payer: Self-pay

## 2011-07-08 NOTE — Telephone Encounter (Signed)
Pt aware that she isn't due for a refill until 07/18/11. As for increasing her meds she would have to come in and talk to Dr. Riley Kill about that.

## 2011-07-08 NOTE — Telephone Encounter (Signed)
Pt is requesting a refill on her oxy.  She would like it increased because she is having to take more due to her walking more and hurting more.  Call back [phone]

## 2011-07-18 ENCOUNTER — Telehealth: Payer: Self-pay | Admitting: Physical Medicine & Rehabilitation

## 2011-07-18 DIAGNOSIS — M7530 Calcific tendinitis of unspecified shoulder: Secondary | ICD-10-CM

## 2011-07-18 DIAGNOSIS — IMO0001 Reserved for inherently not codable concepts without codable children: Secondary | ICD-10-CM

## 2011-07-18 MED ORDER — CARISOPRODOL 350 MG PO TABS: 350.0000 mg | ORAL_TABLET | Freq: Three times a day (TID) | ORAL | Status: DC | PRN

## 2011-07-18 MED ORDER — OXYCODONE-ACETAMINOPHEN 5-325 MG PO TABS: 1.0000 | ORAL_TABLET | Freq: Three times a day (TID) | ORAL | Status: DC | PRN

## 2011-07-18 NOTE — Telephone Encounter (Signed)
Needs Percocet and muscle relaxer filled today.

## 2011-07-23 ENCOUNTER — Other Ambulatory Visit: Payer: Self-pay | Admitting: *Deleted

## 2011-07-23 MED ORDER — MELOXICAM 15 MG PO TABS: 15.0000 mg | ORAL_TABLET | Freq: Every day | ORAL | Status: DC

## 2011-07-24 ENCOUNTER — Other Ambulatory Visit: Payer: Self-pay | Admitting: *Deleted

## 2011-08-08 ENCOUNTER — Encounter: Payer: Self-pay | Admitting: Physical Medicine & Rehabilitation

## 2011-08-08 ENCOUNTER — Encounter: Payer: Medicaid Other | Attending: Physical Medicine & Rehabilitation | Admitting: Physical Medicine & Rehabilitation

## 2011-08-08 VITALS — BP 115/70 | HR 80 | Ht 62.0 in | Wt 198.0 lb

## 2011-08-08 DIAGNOSIS — F32A Depression, unspecified: Secondary | ICD-10-CM | POA: Insufficient documentation

## 2011-08-08 DIAGNOSIS — IMO0002 Reserved for concepts with insufficient information to code with codable children: Secondary | ICD-10-CM

## 2011-08-08 DIAGNOSIS — F419 Anxiety disorder, unspecified: Secondary | ICD-10-CM | POA: Insufficient documentation

## 2011-08-08 DIAGNOSIS — F341 Dysthymic disorder: Secondary | ICD-10-CM

## 2011-08-08 DIAGNOSIS — M170 Bilateral primary osteoarthritis of knee: Secondary | ICD-10-CM

## 2011-08-08 DIAGNOSIS — M1710 Unilateral primary osteoarthritis, unspecified knee: Secondary | ICD-10-CM | POA: Insufficient documentation

## 2011-08-08 DIAGNOSIS — M47812 Spondylosis without myelopathy or radiculopathy, cervical region: Secondary | ICD-10-CM

## 2011-08-08 DIAGNOSIS — IMO0001 Reserved for inherently not codable concepts without codable children: Secondary | ICD-10-CM

## 2011-08-08 DIAGNOSIS — M7530 Calcific tendinitis of unspecified shoulder: Secondary | ICD-10-CM

## 2011-08-08 DIAGNOSIS — F329 Major depressive disorder, single episode, unspecified: Secondary | ICD-10-CM | POA: Insufficient documentation

## 2011-08-08 MED ORDER — FLUOXETINE HCL 10 MG PO TABS: 10.0000 mg | ORAL_TABLET | Freq: Every day | ORAL | Status: DC

## 2011-08-08 MED ORDER — OXYCODONE-ACETAMINOPHEN 5-325 MG PO TABS: 1.0000 | ORAL_TABLET | Freq: Three times a day (TID) | ORAL | Status: DC | PRN

## 2011-08-08 NOTE — Patient Instructions (Signed)
Please be careful with your lifting and pulling at home.  Maintain good posture and stretching each day.

## 2011-08-08 NOTE — Progress Notes (Signed)
Subjective:    Patient ID: Summer Estrada, female    DOB: [birth year], 51 y.o.   MRN: [id number]  HPI Summer Estrada is back regarding her chronic cervical and shoulder pain. Her neck and shoulders have been more painful over the last few weeks. She tells me she has been doing her exercises and takes her meds as prescribed.  Her knees are feeling better because she's walking more.   She feels a lot of stress from her living situation and doesn't know whent for sure she will be able to get out from under it.  She feels depressed and has mood swings regularly.  Sleeping is an issue.  Pain Inventory Average Pain 10 Pain Right Now 10 My pain is sharp, dull and aching  In the last 24 hours, has pain interfered with the following? General activity 8 Relation with others 10 Enjoyment of life 8 What TIME of day is your pain at its worst? varies Sleep (in general) Fair  Pain is worse with: walking, bending, sitting, standing and some activites Pain improves with: heat/ice, therapy/exercise and medication Relief from Meds: 4  Mobility use a cane how many minutes can you walk? 10 min ability to climb steps?  no do you drive?  yes Do you have any goals in this area?  yes  Function disabled: date disabled  I need assistance with the following:  meal prep, household duties and shopping  Neuro/Psych dizziness depression anxiety  Prior Studies Any changes since last visit?  no  Physicians involved in your care Any changes since last visit?  no   Family History  Problem Relation Age of Onset  . Kidney disease Mother   . Heart disease Father    History   Social History  . Marital Status: Widowed    Spouse Name: N/A    Number of Children: N/A  . Years of Education: N/A   Social History Main Topics  . Smoking status: Current Everyday Smoker  . Smokeless tobacco: None  . Alcohol Use: None  . Drug Use: None  . Sexually Active: None   Other Topics Concern  . None   Social  History Narrative  . None   Past Surgical History  Procedure Date  . Total hip arthroplasty   . Cesarean section    Past Medical History  Diagnosis Date  . Depression   . Knee pain    BP 115/70  Pulse 80  Ht 5\' 2"  (1.575 m)  Wt 198 lb (89.812 kg)  BMI 36.21 kg/m2  SpO2 99%     Review of Systems  Constitutional: Positive for diaphoresis.  All other systems reviewed and are negative.       Objective:   Physical Exam  Constitutional: She is oriented to person, place, and time. She appears well-developed and well-nourished.  HENT:  Head: Normocephalic and atraumatic.  Eyes: Conjunctivae and EOM are normal. Pupils are equal, round, and reactive to light. No scleral icterus.  Cardiovascular: Normal rate and regular rhythm.   Pulmonary/Chest: Effort normal and breath sounds normal.  Abdominal: Bowel sounds are normal.  Musculoskeletal: Normal range of motion.       Patient with generalized pain over the cervical spine into either shoulder. There is no focal spasm of the trapezius muscles however they were slightly tender to deep palpation. Shoulders noted for mild pain with external and internal rotation. Flexion of the neck did seem to cause more pain. Spurling's test is negative.  Minimal discomfort today at the  knees although there was some slight pain with weightbearing and meniscal maneuvers.  Neurological: She is alert and oriented to person, place, and time. She has normal strength and normal reflexes. No cranial nerve deficit or sensory deficit.  Psychiatric: Her speech is normal. Thought content normal. Her mood appears anxious. Cognition and memory are normal. She exhibits a depressed mood.          Assessment & Plan:  ASSESSMENT:  1. Chronic rotator cuff syndrome with myofascial shoulder girdle pain 2. 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 Anxiety and depression. 4. Mild ostearthritis of the knees   PLAN:  1. Arrange a C6-7 ESI at  [facility] [facility].   2. Voltaren gel to knees qid, also use ice  3. I discussed with the patient the impact her depression and psychosocial issues are having on her pain. She agrees. It appears that she has to help provide care for her husband who is medical needs of his own which also puts a lot of stress on her..  4. Percocet for breakthrough pain. #75 5. Knee exercises and continued ambulation as possible.  6. Continue maintenance exercises for shoulder  7. Will follow up with her in about 4-6 weeks after injections. May have to consider surgical evaluation as well especially for the C5-C6 level. However, the patient is not having any neurological symptoms at present.

## 2011-08-12 ENCOUNTER — Other Ambulatory Visit: Payer: Self-pay | Admitting: Physical Medicine & Rehabilitation

## 2011-08-12 DIAGNOSIS — M542 Cervicalgia: Secondary | ICD-10-CM

## 2011-08-19 ENCOUNTER — Ambulatory Visit
Admission: RE | Admit: 2011-08-19 | Discharge: 2011-08-19 | Disposition: A | Discharge status: Home / Self Care | Payer: Medicaid Other | Source: Ambulatory Visit | Attending: Physical Medicine & Rehabilitation | Admitting: Physical Medicine & Rehabilitation

## 2011-08-19 DIAGNOSIS — M542 Cervicalgia: Secondary | ICD-10-CM

## 2011-08-19 MED ORDER — IOHEXOL 300 MG/ML  SOLN
1.0000 mL | Freq: Once | INTRAMUSCULAR | Status: AC | PRN
  Administered 2011-08-19: 1 mL via EPIDURAL

## 2011-08-19 MED ORDER — TRIAMCINOLONE ACETONIDE 40 MG/ML IJ SUSP (RADIOLOGY)
60.0000 mg | Freq: Once | INTRAMUSCULAR | Status: AC
  Administered 2011-08-19: 60 mg via EPIDURAL

## 2011-08-19 NOTE — Discharge Instructions (Signed)

## 2011-08-26 ENCOUNTER — Other Ambulatory Visit: Payer: Self-pay | Admitting: Physical Medicine & Rehabilitation

## 2011-09-09 ENCOUNTER — Encounter: Payer: Self-pay | Admitting: Physical Medicine & Rehabilitation

## 2011-09-09 ENCOUNTER — Encounter: Payer: Medicaid Other | Attending: Physical Medicine & Rehabilitation | Admitting: Physical Medicine & Rehabilitation

## 2011-09-09 VITALS — BP 107/61 | HR 91 | Resp 14 | Ht 63.0 in | Wt 192.0 lb

## 2011-09-09 DIAGNOSIS — M1710 Unilateral primary osteoarthritis, unspecified knee: Secondary | ICD-10-CM | POA: Insufficient documentation

## 2011-09-09 DIAGNOSIS — M7530 Calcific tendinitis of unspecified shoulder: Secondary | ICD-10-CM | POA: Insufficient documentation

## 2011-09-09 DIAGNOSIS — M47812 Spondylosis without myelopathy or radiculopathy, cervical region: Secondary | ICD-10-CM | POA: Insufficient documentation

## 2011-09-09 DIAGNOSIS — IMO0002 Reserved for concepts with insufficient information to code with codable children: Secondary | ICD-10-CM | POA: Insufficient documentation

## 2011-09-09 DIAGNOSIS — M170 Bilateral primary osteoarthritis of knee: Secondary | ICD-10-CM

## 2011-09-09 DIAGNOSIS — F341 Dysthymic disorder: Secondary | ICD-10-CM | POA: Insufficient documentation

## 2011-09-09 DIAGNOSIS — IMO0001 Reserved for inherently not codable concepts without codable children: Secondary | ICD-10-CM | POA: Insufficient documentation

## 2011-09-09 DIAGNOSIS — F419 Anxiety disorder, unspecified: Secondary | ICD-10-CM

## 2011-09-09 MED ORDER — OXYCODONE-ACETAMINOPHEN 5-325 MG PO TABS: 1.0000 | ORAL_TABLET | Freq: Three times a day (TID) | ORAL | Status: DC | PRN

## 2011-09-09 NOTE — Patient Instructions (Signed)
REMEMBER GOOD POSTURE AT HOME AND ESPECIALLY WHEN YOU'RE ON THE COMPUTER!!

## 2011-09-09 NOTE — Progress Notes (Signed)
Subjective:    Patient ID: Summer Estrada, female    DOB: [birth year], 51 y.o.   MRN: [id number]  HPI  Summer Estrada is back regarding her multiple pain issues. She states that the cervical ESI was quite helpful but it only lasted about 2.5 weeks.  Her pain level dropped by about 50-60 percent.  She noticed her pain increases whenever she is on the computer.  She admits that the computer sits too low on her desk.   She has noticed some back tightness on the left side when she walks, usually after 2 or 3 laps.   Summer Estrada states that she's in the process of moving and her husband continues to require care due to his multiple medical issues. She would like to get back to work , but feels that it would be difficult for her to find a job.  Pain Inventory Average Pain 7 Pain Right Now 9 My pain is intermittent, sharp, dull, stabbing and aching  In the last 24 hours, has pain interfered with the following? General activity 9 Relation with others 5 Enjoyment of life 9 What TIME of day is your pain at its worst? varies Sleep (in general) Fair  Pain is worse with: walking, bending, sitting and standing Pain improves with: pacing activities and medication Relief from Meds: 7  Mobility use a cane ability to climb steps?  yes do you drive?  yes  Function disabled: date disabled   Neuro/Psych No problems in this area  Prior Studies Any changes since last visit?  no  Physicians involved in your care Any changes since last visit?  no   Family History  Problem Relation Age of Onset  . Kidney disease Mother   . Heart disease Father    History   Social History  . Marital Status: Widowed    Spouse Name: N/A    Number of Children: N/A  . Years of Education: N/A   Social History Main Topics  . Smoking status: Current Everyday Smoker  . Smokeless tobacco: None  . Alcohol Use: None  . Drug Use: None  . Sexually Active: None   Other Topics Concern  . None   Social History  Narrative  . None   Past Surgical History  Procedure Date  . Total hip arthroplasty   . Cesarean section    Past Medical History  Diagnosis Date  . Depression   . Knee pain    BP 107/61  Pulse 91  Resp 14  Ht 5\' 3"  (1.6 m)  Wt 192 lb (87.091 kg)  BMI 34.01 kg/m2  SpO2 100%     Review of Systems  Musculoskeletal: Positive for joint swelling and arthralgias.  All other systems reviewed and are negative.       Objective:   Physical Exam  Constitutional: She is oriented to person, place, and time. She appears well-developed and well-nourished.  HENT:  Head: Normocephalic and atraumatic.  Eyes: Conjunctivae and EOM are normal. Pupils are equal, round, and reactive to light. No scleral icterus.  Cardiovascular: Normal rate and regular rhythm.  Pulmonary/Chest: Effort normal and breath sounds normal.  Abdominal: Bowel sounds are normal.  Musculoskeletal: Normal range of motion.  Patient with less pain over the cervical spine into either shoulder. There is no focal spasm in the neck musculature. She had minimal pain to flexion, extension, rotation, and lateral bending.   Spurling's test is negative. Head position fairly good.   Lumbar spine with tightness in the left paraspinals. She  tends to stand with a lean to the left as well and needs to be cued to come to neutral.  Minimal discomfort today at the knees although there was some slight pain with weightbearing and meniscal maneuvers.  Neurological: She is alert and oriented to person, place, and time. She has normal strength and normal reflexes. No cranial nerve deficit or sensory deficit.  Psychiatric: Her speech is normal. Thought content normal. Her mood appears anxious. Cognition and memory are normal. She exhibits improved mood. Assessment & Plan:   ASSESSMENT:  1. Chronic rotator cuff syndrome with myofascial shoulder girdle pain  2. 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 Anxiety and  depression.  4. Mild ostearthritis of the knees  PLAN:  1. Arrange a repeat C7-T1 ESI at [facility].  2. Voltaren gel to knees qid, also use ice  3. Continue relaxation techniques and stress relief as possible at home.  4. Percocet for breakthrough pain. #75 was refilled 5. Knee exercises and continued ambulation as possible. Discussed the importance of good posture with her everyday activities  6. Continue maintenance exercises for shoulder  7. Will follow up with her in about 6 weeks after injections. May still have to consider surgical evaluation as well especially for the C5-C6 level. However, the patient is still not having any neurological symptoms at present.

## 2011-09-19 ENCOUNTER — Other Ambulatory Visit: Payer: Self-pay | Admitting: Physical Medicine & Rehabilitation

## 2011-09-19 DIAGNOSIS — M47812 Spondylosis without myelopathy or radiculopathy, cervical region: Secondary | ICD-10-CM

## 2011-09-24 ENCOUNTER — Ambulatory Visit
Admission: RE | Admit: 2011-09-24 | Discharge: 2011-09-24 | Disposition: A | Discharge status: Home / Self Care | Payer: Medicaid Other | Source: Ambulatory Visit | Attending: Physical Medicine & Rehabilitation | Admitting: Physical Medicine & Rehabilitation

## 2011-09-24 DIAGNOSIS — M47812 Spondylosis without myelopathy or radiculopathy, cervical region: Secondary | ICD-10-CM

## 2011-09-24 MED ORDER — IOHEXOL 300 MG/ML  SOLN
1.0000 mL | Freq: Once | INTRAMUSCULAR | Status: AC | PRN
  Administered 2011-09-24: 1 mL via EPIDURAL

## 2011-09-24 MED ORDER — TRIAMCINOLONE ACETONIDE 40 MG/ML IJ SUSP (RADIOLOGY)
60.0000 mg | Freq: Once | INTRAMUSCULAR | Status: AC
  Administered 2011-09-24: 60 mg via EPIDURAL

## 2011-09-28 ENCOUNTER — Other Ambulatory Visit: Payer: Self-pay | Admitting: Physical Medicine & Rehabilitation

## 2011-10-02 ENCOUNTER — Telehealth: Payer: Self-pay | Admitting: Physical Medicine & Rehabilitation

## 2011-10-02 NOTE — Telephone Encounter (Signed)
Epidural in neck DID NOT work this time.  In so much pain.  Please call.

## 2011-10-02 NOTE — Telephone Encounter (Signed)
Can try heating pack, applying voltaren gel to the neck, and ask whether she is taking the mobic every day

## 2011-10-02 NOTE — Telephone Encounter (Signed)
Pt is already on Percocet and Soma. Any other suggestions?

## 2011-10-03 NOTE — Telephone Encounter (Signed)
How about heat or ice, otherwise she might have to follow up with Dr. Doroteo Bradford, if he did the injection

## 2011-10-03 NOTE — Telephone Encounter (Signed)
Pt aware and she will try ice/heat.

## 2011-10-03 NOTE — Telephone Encounter (Signed)
Pt states that she is using Voltaren Gel on her neck and is taking Mobic everyday. Any other suggestions?

## 2011-10-03 NOTE — Telephone Encounter (Signed)
LM with her husband to have her call back

## 2011-10-07 ENCOUNTER — Telehealth: Payer: Self-pay | Admitting: *Deleted

## 2011-10-07 DIAGNOSIS — IMO0001 Reserved for inherently not codable concepts without codable children: Secondary | ICD-10-CM

## 2011-10-07 DIAGNOSIS — M7530 Calcific tendinitis of unspecified shoulder: Secondary | ICD-10-CM

## 2011-10-07 DIAGNOSIS — M47812 Spondylosis without myelopathy or radiculopathy, cervical region: Secondary | ICD-10-CM

## 2011-10-07 DIAGNOSIS — F329 Major depressive disorder, single episode, unspecified: Secondary | ICD-10-CM

## 2011-10-07 DIAGNOSIS — M170 Bilateral primary osteoarthritis of knee: Secondary | ICD-10-CM

## 2011-10-07 MED ORDER — OXYCODONE-ACETAMINOPHEN 5-325 MG PO TABS: 1.0000 | ORAL_TABLET | Freq: Three times a day (TID) | ORAL | Status: DC | PRN

## 2011-10-07 NOTE — Telephone Encounter (Signed)
Printed for Swartz to sign. 

## 2011-10-07 NOTE — Telephone Encounter (Signed)
Refill Oxycodone 

## 2011-10-07 NOTE — Telephone Encounter (Signed)
Rx is ready for pick up, pt aware. 

## 2011-10-22 ENCOUNTER — Other Ambulatory Visit: Payer: Self-pay | Admitting: Physical Medicine & Rehabilitation

## 2011-10-24 ENCOUNTER — Encounter: Payer: Self-pay | Admitting: *Deleted

## 2011-10-24 ENCOUNTER — Encounter: Payer: Self-pay | Admitting: Physical Medicine & Rehabilitation

## 2011-10-24 ENCOUNTER — Telehealth: Payer: Self-pay | Admitting: *Deleted

## 2011-10-24 ENCOUNTER — Encounter: Payer: Medicaid Other | Attending: Physical Medicine & Rehabilitation | Admitting: Physical Medicine & Rehabilitation

## 2011-10-24 VITALS — BP 127/57 | HR 80 | Resp 16 | Ht 63.0 in | Wt 193.6 lb

## 2011-10-24 DIAGNOSIS — F419 Anxiety disorder, unspecified: Secondary | ICD-10-CM

## 2011-10-24 DIAGNOSIS — M7650 Patellar tendinitis, unspecified knee: Secondary | ICD-10-CM

## 2011-10-24 DIAGNOSIS — M47812 Spondylosis without myelopathy or radiculopathy, cervical region: Secondary | ICD-10-CM | POA: Insufficient documentation

## 2011-10-24 DIAGNOSIS — M7530 Calcific tendinitis of unspecified shoulder: Secondary | ICD-10-CM

## 2011-10-24 DIAGNOSIS — IMO0001 Reserved for inherently not codable concepts without codable children: Secondary | ICD-10-CM

## 2011-10-24 DIAGNOSIS — F341 Dysthymic disorder: Secondary | ICD-10-CM | POA: Insufficient documentation

## 2011-10-24 DIAGNOSIS — F329 Major depressive disorder, single episode, unspecified: Secondary | ICD-10-CM

## 2011-10-24 DIAGNOSIS — M1710 Unilateral primary osteoarthritis, unspecified knee: Secondary | ICD-10-CM | POA: Insufficient documentation

## 2011-10-24 DIAGNOSIS — IMO0002 Reserved for concepts with insufficient information to code with codable children: Secondary | ICD-10-CM | POA: Insufficient documentation

## 2011-10-24 DIAGNOSIS — M170 Bilateral primary osteoarthritis of knee: Secondary | ICD-10-CM

## 2011-10-24 MED ORDER — OXYCODONE-ACETAMINOPHEN 5-325 MG PO TABS: 1.0000 | ORAL_TABLET | Freq: Three times a day (TID) | ORAL | Status: DC | PRN

## 2011-10-24 MED ORDER — OXYCODONE-ACETAMINOPHEN 5-325 MG PO TABS: 1.0000 | ORAL_TABLET | Freq: Four times a day (QID) | ORAL | Status: DC | PRN

## 2011-10-24 NOTE — Patient Instructions (Signed)
Pursue range of motion to your left arm but don't "over do it"

## 2011-10-24 NOTE — Progress Notes (Signed)
Summer Estrada returned the rx for Percocet 5/325 1 q 8hr #75 and picked up the new rx Percocet 1 q 6hr # 75. Destroyed returned rx.

## 2011-10-24 NOTE — Progress Notes (Signed)
Subjective:    Patient ID: Summer Estrada, female    DOB: September 13, 1960, 51 y.o.   MRN: [id number]  HPI  Summer Estrada is back regarding her chronic neck and shoulder/arm pain. She states the epidural at [facility](C7-T1) did not help and in fact made the pain worse. She's having neck symptoms, but her worst pain is in the left shoulder. She has tried some general stretching and better posture, etx and nothing seems to work. Her medications aren't effective either. Pain seems to radiate from her upper shoulder to the mid humerus.  She still is in the process of moving, and her husband's health continues to decline.     Pain Inventory Average Pain 9 Pain Right Now 9 My pain is constant, sharp, dull and stabbing  In the last 24 hours, has pain interfered with the following? General activity 10 Relation with others 10 Enjoyment of life 10 What TIME of day is your pain at its worst? all of the time Sleep (in general) Poor  Pain is worse with: walking, bending, sitting, inactivity and standing Pain improves with: medication and injections Relief from Meds: 0  Mobility use a cane ability to climb steps?  no do you drive?  yes  Function disabled: date disabled  I need assistance with the following:  household duties  Neuro/Psych No problems in this area  Prior Studies Any changes since last visit?  yes has had 2 cervical injections, last being last month. Last one did not help.  Physicians involved in your care Any changes since last visit?  no   Family History  Problem Relation Age of Onset  . Kidney disease Mother   . Heart disease Father    History   Social History  . Marital Status: Widowed    Spouse Name: N/A    Number of Children: N/A  . Years of Education: N/A   Social History Main Topics  . Smoking status: Current Everyday Smoker  . Smokeless tobacco: Never Used  . Alcohol Use: None  . Drug Use: None  . Sexually Active: None   Other Topics Concern  .  None   Social History Narrative  . None   Past Surgical History  Procedure Date  . Total hip arthroplasty   . Cesarean section    Past Medical History  Diagnosis Date  . Depression   . Knee pain    BP 127/57  Pulse 80  Resp 16  Ht 5\' 3"  (1.6 m)  Wt 193 lb 9.6 oz (87.816 kg)  BMI 34.29 kg/m2  SpO2 99%    Review of Systems  HENT: Positive for neck pain.   Musculoskeletal: Positive for gait problem.       Shoulder and knee pain  Psychiatric/Behavioral: Positive for disturbed wake/sleep cycle.  All other systems reviewed and are negative.       Objective:   Physical Exam Constitutional: She is oriented to person, place, and time. She appears well-developed and well-nourished.  HENT:  Head: Normocephalic and atraumatic.  Eyes: Conjunctivae and EOM are normal. Pupils are equal, round, and reactive to light. No scleral icterus.  Cardiovascular: Normal rate and regular rhythm.  Pulmonary/Chest: Effort normal and breath sounds normal.  Abdominal: Bowel sounds are normal.  Musculoskeletal: Normal range of motion.  Patient with generalized pain over the cervical spine into either shoulder. There is no focal spasm of the trapezius muscles however they were slightly tender to deep palpation. Shoulders noted for mild pain with external and internal  rotation. Flexion of the neck did seem to cause more pain. Spurling's test is negative. Rotator cuff impingement maneuvers were positve. She's slightly tender at the left [facility] joint. Apprehension test was negative. She had pain with palpation over the acromium into the subdeltoid bursa area. Drop arm test was negative.   Minimal discomfort today at the knees although there was some slight pain with weightbearing and meniscal maneuvers.  Neurological: She is alert and oriented to person, place, and time. She has normal strength and normal reflexes. No cranial nerve deficit or sensory deficit.  Psychiatric: Her speech is normal. Thought  content normal. Her mood appears anxious. Cognition and memory are normal. She exhibits a slightly depressed mood.    Assessment & Plan:   ASSESSMENT:  1. Chronic rotator cuff syndrome with myofascial shoulder girdle pain- i think this is more the source of her pain at present. 2. 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 3. Anxiety and depression.  4. Mild ostearthritis of the knees  PLAN:  1. After informed consent and preparation of the skin with isopropyl alcohol, i injected via lateral approach with 40mg  kenalog and 3cc of 1% lidocaine. The patient tolerated well. I advised gentle AROM of the left shoulder for now. 2. Voltaren gel to knees qid, also use ice  3. Will send her for an MRI of this painful left shoulder to r/o significant RTC disease. 4. Percocet for breakthrough pain. #75  Was refilled today. 5. Knee exercises and continued ambulation as possible.  6. Continue maintenance exercises for shoulder  7. May have to consider surgical evaluation as well especially for the C5-C6 level. However, the patient is not having any neurological symptoms at present and I think the shoulder pain is primarily RTC. 8. I'll see her back in about one month. Incidentally, the patient reported substantial relief in her left shoulder pain before she left the office.

## 2011-10-24 NOTE — Telephone Encounter (Signed)
Instructions on Summer Estrada's Percocet was changed to q 6 hours instead of q 8 hours with #75 to be dispensed. She is to bring RX given to her today at her visit in exchange for the new RX. Pharmacy was notified of changes to be made. She was notified to return script and pick up the new one.

## 2011-10-24 NOTE — Telephone Encounter (Signed)
Medicaid will not pay for the new medication Dr. Riley Kill started her on today. Please call and change medication.

## 2011-10-28 ENCOUNTER — Telehealth: Payer: Self-pay

## 2011-10-28 NOTE — Telephone Encounter (Signed)
Need a statement from the ambulance driver that he had the medication and threw it away

## 2011-10-28 NOTE — Telephone Encounter (Signed)
Pts husband died and when the ambulance came and got him she says they took her medications instead of his because they were on the side of the bed he was on.  Pt needs Hydrocodone, oxycodone, carisoprodol, meloxicam refilled.  Please advise.

## 2011-10-29 NOTE — Telephone Encounter (Signed)
Pt aware we need statement regarding missing pills.  She went on to say that there were 3 different ambulance services that showed up at her house.  Later we got a message from [facility] requesting refill on soma due to the situation.  Waiting on statement at this time.

## 2011-10-29 NOTE — Telephone Encounter (Signed)
Ok, thanks.

## 2011-11-01 ENCOUNTER — Other Ambulatory Visit: Payer: Medicaid Other

## 2011-11-05 ENCOUNTER — Ambulatory Visit
Admission: RE | Admit: 2011-11-05 | Discharge: 2011-11-05 | Disposition: A | Discharge status: Home / Self Care | Payer: Medicaid Other | Source: Ambulatory Visit | Attending: Physical Medicine & Rehabilitation | Admitting: Physical Medicine & Rehabilitation

## 2011-11-05 DIAGNOSIS — M7530 Calcific tendinitis of unspecified shoulder: Secondary | ICD-10-CM

## 2011-11-17 ENCOUNTER — Other Ambulatory Visit: Payer: Self-pay | Admitting: Physical Medicine & Rehabilitation

## 2011-11-24 ENCOUNTER — Encounter: Payer: Self-pay | Admitting: Physical Medicine & Rehabilitation

## 2011-11-24 ENCOUNTER — Other Ambulatory Visit: Payer: Self-pay | Admitting: Physical Medicine and Rehabilitation

## 2011-11-24 ENCOUNTER — Encounter: Payer: Medicaid Other | Attending: Physical Medicine & Rehabilitation | Admitting: Physical Medicine & Rehabilitation

## 2011-11-24 VITALS — BP 129/72 | HR 106 | Resp 16 | Ht 65.0 in | Wt 185.0 lb

## 2011-11-24 DIAGNOSIS — IMO0001 Reserved for inherently not codable concepts without codable children: Secondary | ICD-10-CM

## 2011-11-24 DIAGNOSIS — IMO0002 Reserved for concepts with insufficient information to code with codable children: Secondary | ICD-10-CM

## 2011-11-24 DIAGNOSIS — F419 Anxiety disorder, unspecified: Secondary | ICD-10-CM

## 2011-11-24 DIAGNOSIS — F341 Dysthymic disorder: Secondary | ICD-10-CM

## 2011-11-24 DIAGNOSIS — M7650 Patellar tendinitis, unspecified knee: Secondary | ICD-10-CM

## 2011-11-24 DIAGNOSIS — M47812 Spondylosis without myelopathy or radiculopathy, cervical region: Secondary | ICD-10-CM

## 2011-11-24 DIAGNOSIS — M170 Bilateral primary osteoarthritis of knee: Secondary | ICD-10-CM

## 2011-11-24 DIAGNOSIS — M7530 Calcific tendinitis of unspecified shoulder: Secondary | ICD-10-CM

## 2011-11-24 DIAGNOSIS — M1710 Unilateral primary osteoarthritis, unspecified knee: Secondary | ICD-10-CM

## 2011-11-24 MED ORDER — CARISOPRODOL 350 MG PO TABS: 350.0000 mg | ORAL_TABLET | Freq: Three times a day (TID) | ORAL | Status: DC | PRN

## 2011-11-24 MED ORDER — OXYCODONE-ACETAMINOPHEN 5-325 MG PO TABS: 1.0000 | ORAL_TABLET | Freq: Four times a day (QID) | ORAL | Status: DC | PRN

## 2011-11-24 NOTE — Progress Notes (Signed)
Subjective:    Patient ID: Summer Estrada, female    DOB: May 11, 1960, 51 y.o.   MRN: [id number]  HPI  Summer Estrada is back regarding her multiple pain complaints. Since I last saw her, her husband died unfortunately. She also moved into a new home, and thus, there has been a lot of physical and mental stress upon her. The left shoulder injection herlped her until she started moving things around in the house. The MRI of the shoulder revealed mild tendinosis of the suprapinatus, infraspinatus, and subacromial bursitis.  She is trying to walk more, but the increased activity tends to make her knee worse. The knee will sometimes "crack" and "pop". She also notes swelling with increased activity. She is using her voltaren gel and occasionally ices the knee as well. We did xrays of the knees in April which showed only mild OA.    She continues on percocet for pain and soma for muscle spasms.   Pain Inventory Average Pain 10 Pain Right Now 8 My pain is sharp, stabbing and aching  In the last 24 hours, has pain interfered with the following? General activity 10 Relation with others 0 Enjoyment of life 10 What TIME of day is your pain at its worst? All Day Sleep (in general) Fair  Pain is worse with: walking, sitting and standing Pain improves with: medication Relief from Meds: 5  Mobility use a cane do you drive?  no  Function Do you have any goals in this area?  no  Neuro/Psych depression anxiety  Prior Studies CT/MRI  Physicians involved in your care Any changes since last visit?  no   Family History  Problem Relation Age of Onset  . Kidney disease Mother   . Heart disease Father    History   Social History  . Marital Status: Married    Spouse Name: N/A    Number of Children: N/A  . Years of Education: N/A   Social History Main Topics  . Smoking status: Current Everyday Smoker  . Smokeless tobacco: Never Used  . Alcohol Use: None  . Drug Use: None  . Sexually  Active: None   Other Topics Concern  . None   Social History Narrative  . None   Past Surgical History  Procedure Date  . Total hip arthroplasty   . Cesarean section    Past Medical History  Diagnosis Date  . Depression   . Knee pain    BP 129/72  Pulse 106  Resp 16  Ht 5\' 5"  (1.651 m)  Wt 185 lb (83.915 kg)  BMI 30.79 kg/m2  SpO2 99%      Review of Systems  Constitutional: Negative.   HENT: Positive for neck pain and neck stiffness.   Eyes: Negative.   Respiratory: Negative.   Cardiovascular: Negative.   Gastrointestinal: Negative.   Genitourinary: Negative.   Musculoskeletal: Positive for gait problem.  Skin: Negative.   Neurological: Negative.   Hematological: Negative.   Psychiatric/Behavioral:       Depression/Anxiety       Objective:   Physical Exam Physical Exam  Constitutional: She is oriented to person, place, and time. She appears well-developed and well-nourished.  HENT:  Head: Normocephalic and atraumatic.  Eyes: Conjunctivae and EOM are normal. Pupils are equal, round, and reactive to light. No scleral icterus.  Cardiovascular: Normal rate and regular rhythm.  Pulmonary/Chest: Effort normal and breath sounds normal.  Abdominal: Bowel sounds are normal.  Musculoskeletal: Normal range of motion.  Patient  with generalized pain over the cervical spine into either shoulder. There is no focal spasm of the trapezius muscles however they were slightly tender to deep palpation. Shoulders noted for mild pain with external and internal rotation. Flexion of the neck did seem to cause more pain. Spurling's test is negative. Rotator cuff impingement maneuvers were positve. She's slightly tender at the left [facility] joint. Apprehension test was negative. She had pain with palpation over the acromium into the subdeltoid bursa area. Drop arm test was negative.   Increased pain in the right knee with AROM, PROM. Mild crepitus appreciated.  pain with weightbearing and  meniscal maneuvers. Mild swelling noted. Neurological: She is alert and oriented to person, place, and time. She has normal strength and normal reflexes. No cranial nerve deficit or sensory deficit.  Psychiatric: Her speech is normal. Thought content normal. Her mood appears anxious. Cognition and memory are normal. She exhibits a slightly depressed mood.  Assessment & Plan:   ASSESSMENT:  1. Chronic rotator cuff syndrome with myofascial shoulder girdle pain- i think this is more the source of her pain at present.  2. 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  3. Anxiety and depression.  4. Mild ostearthritis of the knees. Likley has meniscal injury of the right knee.    PLAN:  1. After informed consent and preparation of the skin with isopropyl alcohol, i injected via lateral approach with 40mg  kenalog and 3cc of 1% lidocaine. The patient tolerated well. I advised again gentle AROM of the left shoulder for now. Discussed advancing movement as possible. Provided exercises today also. Ideally I would send her for PT 2. Voltaren gel to knees qid, also use ice. 3.After informed consent i injected the right knee via latera approach using 40mg  methylprenisolone and 4 cc lidocaine. Pt tolerated well. Consider MRI of the right knee 4. Percocet for breakthrough pain. #75 Was refilled today.  5. Knee exercises and continued ambulation as possible. Needs to know limits! 6.  Follow up with me in one month 7. Stil May have to consider surgical evaluation as well especially for the C5-C6 level. However, the patient is not having any neurological symptoms at present and I think the shoulder pain is primarily RTC.  8. We discussed the effects of the physical and mental stress on her pain. She hast to be aware of that going forward. Hopefully her life will settle down a bit over the coming months.

## 2011-11-24 NOTE — Patient Instructions (Signed)
Impingement Syndrome, Rotator Cuff, Bursitis with Rehab Impingement syndrome is a condition that involves inflammation of the tendons of the rotator cuff and the subacromial bursa, that causes pain in the shoulder. The rotator cuff consists of four tendons and muscles that control much of the shoulder and upper arm function. The subacromial bursa is a fluid filled sac that helps reduce friction between the rotator cuff and one of the bones of the shoulder (acromion). Impingement syndrome is usually an overuse injury that causes swelling of the bursa (bursitis), swelling of the tendon (tendonitis), and/or a tear of the tendon (strain). Strains are classified into three categories. Grade 1 strains cause pain, but the tendon is not lengthened. Grade 2 strains include a lengthened ligament, due to the ligament being stretched or partially ruptured. With grade 2 strains there is still function, although the function may be decreased. Grade 3 strains include a complete tear of the tendon or muscle, and function is usually impaired. SYMPTOMS   Pain around the shoulder, often at the outer portion of the upper arm.   Pain that gets worse with shoulder function, especially when reaching overhead or lifting.   Sometimes, aching when not using the arm.   Pain that wakes you up at night.   Sometimes, tenderness, swelling, warmth, or redness over the affected area.   Loss of strength.   Limited motion of the shoulder, especially reaching behind the back (to the back pocket or to unhook bra) or across your body.   Crackling sound (crepitation) when moving the arm.   Biceps tendon pain and inflammation (in the front of the shoulder). Worse when bending the elbow or lifting.  CAUSES  Impingement syndrome is often an overuse injury, in which chronic (repetitive) motions cause the tendons or bursa to become inflamed. A strain occurs when a force is paced on the tendon or muscle that is greater than it can  withstand. Common mechanisms of injury include: Stress from sudden increase in duration, frequency, or intensity of training.  Direct hit (trauma) to the shoulder.   Aging, erosion of the tendon with normal use.   Bony bump on shoulder (acromial spur).  RISK INCREASES WITH:  Contact sports (football, wrestling, boxing).   Throwing sports (baseball, tennis, volleyball).   Weightlifting and bodybuilding.   Heavy labor.   Previous injury to the rotator cuff, including impingement.   Poor shoulder strength and flexibility.   Failure to warm up properly before activity.   Inadequate protective equipment.   Old age.   Bony bump on shoulder (acromial spur).  PREVENTION   Warm up and stretch properly before activity.   Allow for adequate recovery between workouts.   Maintain physical fitness:   Strength, flexibility, and endurance.   Cardiovascular fitness.   Learn and use proper exercise technique.  PROGNOSIS  If treated properly, impingement syndrome usually goes away within 6 weeks. Sometimes surgery is required.  RELATED COMPLICATIONS   Longer healing time if not properly treated, or if not given enough time to heal.   Recurring symptoms, that result in a chronic condition.   Shoulder stiffness, frozen shoulder, or loss of motion.   Rotator cuff tendon tear.   Recurring symptoms, especially if activity is resumed too soon, with overuse, with a direct blow, or when using poor technique.  TREATMENT  Treatment first involves the use of ice and medicine, to reduce pain and inflammation. The use of strengthening and stretching exercises may help reduce pain with activity. These exercises may   be performed at home or with a therapist. If non-surgical treatment is unsuccessful after more than 6 months, surgery may be advised. After surgery and rehabilitation, activity is usually possible in 3 months.  MEDICATION  If pain medicine is needed, nonsteroidal  anti-inflammatory medicines (aspirin and ibuprofen), or other minor pain relievers (acetaminophen), are often advised.   Do not take pain medicine for 7 days before surgery.   Prescription pain relievers may be given, if your caregiver thinks they are needed. Use only as directed and only as much as you need.   Corticosteroid injections may be given by your caregiver. These injections should be reserved for the most serious cases, because they may only be given a certain number of times.  HEAT AND COLD  Cold treatment (icing) should be applied for 10 to 15 minutes every 2 to 3 hours for inflammation and pain, and immediately after activity that aggravates your symptoms. Use ice packs or an ice massage.   Heat treatment may be used before performing stretching and strengthening activities prescribed by your caregiver, physical therapist, or athletic trainer. Use a heat pack or a warm water soak.  SEEK MEDICAL CARE IF:   Symptoms get worse or do not improve in 4 to 6 weeks, despite treatment.   New, unexplained symptoms develop. (Drugs used in treatment may produce side effects.)  EXERCISES  RANGE OF MOTION (ROM) AND STRETCHING EXERCISES - Impingement Syndrome (Rotator Cuff  Tendinitis, Bursitis) These exercises may help you when beginning to rehabilitate your injury. Your symptoms may go away with or without further involvement from your physician, physical therapist or athletic trainer. While completing these exercises, remember:   Restoring tissue flexibility helps normal motion to return to the joints. This allows healthier, less painful movement and activity.   An effective stretch should be held for at least 30 seconds.   A stretch should never be painful. You should only feel a gentle lengthening or release in the stretched tissue.  STRETCH - Flexion, Standing  Stand with good posture. With an underhand grip on your right / left hand, and an overhand grip on the opposite hand, grasp  a broomstick or cane so that your hands are a little more than shoulder width apart.   Keeping your right / left elbow straight and shoulder muscles relaxed, push the stick with your opposite hand, to raise your right / left arm in front of your body and then overhead. Raise your arm until you feel a stretch in your right / left shoulder, but before you have increased shoulder pain.   Try to avoid shrugging your right / left shoulder as your arm rises, by keeping your shoulder blade tucked down and toward your mid-back spine. Hold for __________ seconds.   Slowly return to the starting position.  Repeat __________ times. Complete this exercise __________ times per day. STRETCH - Abduction, Supine  Lie on your back. With an underhand grip on your right / left hand and an overhand grip on the opposite hand, grasp a broomstick or cane so that your hands are a little more than shoulder width apart.   Keeping your right / left elbow straight and your shoulder muscles relaxed, push the stick with your opposite hand, to raise your right / left arm out to the side of your body and then overhead. Raise your arm until you feel a stretch in your right / left shoulder, but before you have increased shoulder pain.   Try to avoid shrugging   your right / left shoulder as your arm rises, by keeping your shoulder blade tucked down and toward your mid-back spine. Hold for __________ seconds.   Slowly return to the starting position.  Repeat __________ times. Complete this exercise __________ times per day. ROM - Flexion, Active-Assisted  Lie on your back. You may bend your knees for comfort.   Grasp a broomstick or cane so your hands are about shoulder width apart. Your right / left hand should grip the end of the stick, so that your hand is positioned "thumbs-up," as if you were about to shake hands.   Using your healthy arm to lead, raise your right / left arm overhead, until you feel a gentle stretch in your  shoulder. Hold for __________ seconds.   Use the stick to assist in returning your right / left arm to its starting position.  Repeat __________ times. Complete this exercise __________ times per day.  ROM - Internal Rotation, Supine   Lie on your back on a firm surface. Place your right / left elbow about 60 degrees away from your side. Elevate your elbow with a folded towel, so that the elbow and shoulder are the same height.   Using a broomstick or cane and your strong arm, pull your right / left hand toward your body until you feel a gentle stretch, but no increase in your shoulder pain. Keep your shoulder and elbow in place throughout the exercise.   Hold for __________ seconds. Slowly return to the starting position.  Repeat __________ times. Complete this exercise __________ times per day. STRETCH - Internal Rotation  Place your right / left hand behind your back, palm up.   Throw a towel or belt over your opposite shoulder. Grasp the towel with your right / left hand.   While keeping an upright posture, gently pull up on the towel, until you feel a stretch in the front of your right / left shoulder.   Avoid shrugging your right / left shoulder as your arm rises, by keeping your shoulder blade tucked down and toward your mid-back spine.   Hold for __________ seconds. Release the stretch, by lowering your healthy hand.  Repeat __________ times. Complete this exercise __________ times per day. ROM - Internal Rotation   Using an underhand grip, grasp a stick behind your back with both hands.   While standing upright with good posture, slide the stick up your back until you feel a mild stretch in the front of your shoulder.   Hold for __________ seconds. Slowly return to your starting position.  Repeat __________ times. Complete this exercise __________ times per day.  STRETCH - Posterior Shoulder Capsule   Stand or sit with good posture. Grasp your right / left elbow and draw it  across your chest, keeping it at the same height as your shoulder.   Pull your elbow, so your upper arm comes in closer to your chest. Pull until you feel a gentle stretch in the back of your shoulder.   Hold for __________ seconds.  Repeat __________ times. Complete this exercise __________ times per day. STRENGTHENING EXERCISES - Impingement Syndrome (Rotator Cuff Tendinitis, Bursitis) These exercises may help you when beginning to rehabilitate your injury. They may resolve your symptoms with or without further involvement from your physician, physical therapist or athletic trainer. While completing these exercises, remember:  Muscles can gain both the endurance and the strength needed for everyday activities through controlled exercises.   Complete these exercises as   instructed by your physician, physical therapist or athletic trainer. Increase the resistance and repetitions only as guided.   You may experience muscle soreness or fatigue, but the pain or discomfort you are trying to eliminate should never worsen during these exercises. If this pain does get worse, stop and make sure you are following the directions exactly. If the pain is still present after adjustments, discontinue the exercise until you can discuss the trouble with your clinician.   During your recovery, avoid activity or exercises which involve actions that place your injured hand or elbow above your head or behind your back or head. These positions stress the tissues which you are trying to heal.  STRENGTH - Scapular Depression and Adduction   With good posture, sit on a firm chair. Support your arms in front of you, with pillows, arm rests, or on a table top. Have your elbows in line with the sides of your body.   Gently draw your shoulder blades down and toward your mid-back spine. Gradually increase the tension, without tensing the muscles along the top of your shoulders and the back of your neck.   Hold for  __________ seconds. Slowly release the tension and relax your muscles completely before starting the next repetition.   After you have practiced this exercise, remove the arm support and complete the exercise in standing as well as sitting position.  Repeat __________ times. Complete this exercise __________ times per day.  STRENGTH - Shoulder Abductors, Isometric  With good posture, stand or sit about 4-6 inches from a wall, with your right / left side facing the wall.   Bend your right / left elbow. Gently press your right / left elbow into the wall. Increase the pressure gradually, until you are pressing as hard as you can, without shrugging your shoulder or increasing any shoulder discomfort.   Hold for __________ seconds.   Release the tension slowly. Relax your shoulder muscles completely before you begin the next repetition.  Repeat __________ times. Complete this exercise __________ times per day.  STRENGTH - External Rotators, Isometric  Keep your right / left elbow at your side and bend it 90 degrees.   Step into a door frame so that the outside of your right / left wrist can press against the door frame without your upper arm leaving your side.   Gently press your right / left wrist into the door frame, as if you were trying to swing the back of your hand away from your stomach. Gradually increase the tension, until you are pressing as hard as you can, without shrugging your shoulder or increasing any shoulder discomfort.   Hold for __________ seconds.   Release the tension slowly. Relax your shoulder muscles completely before you begin the next repetition.  Repeat __________ times. Complete this exercise __________ times per day.  STRENGTH - Supraspinatus   Stand or sit with good posture. Grasp a __________ weight, or an exercise band or tubing, so that your hand is "thumbs-up," like you are shaking hands.   Slowly lift your right / left arm in a "V" away from your thigh,  diagonally into the space between your side and straight ahead. Lift your hand to shoulder height or as far as you can, without increasing any shoulder pain. At first, many people do not lift their hands above shoulder height.   Avoid shrugging your right / left shoulder as your arm rises, by keeping your shoulder blade tucked down and toward your mid-back   spine.   Hold for __________ seconds. Control the descent of your hand, as you slowly return to your starting position.  Repeat __________ times. Complete this exercise __________ times per day.  STRENGTH - External Rotators  Secure a rubber exercise band or tubing to a fixed object (table, pole) so that it is at the same height as your right / left elbow when you are standing or sitting on a firm surface.   Stand or sit so that the secured exercise band is at your uninjured side.   Bend your right / left elbow 90 degrees. Place a folded towel or small pillow under your right / left arm, so that your elbow is a few inches away from your side.   Keeping the tension on the exercise band, pull it away from your body, as if pivoting on your elbow. Be sure to keep your body steady, so that the movement is coming only from your rotating shoulder.   Hold for __________ seconds. Release the tension in a controlled manner, as you return to the starting position.  Repeat __________ times. Complete this exercise __________ times per day.  STRENGTH - Internal Rotators   Secure a rubber exercise band or tubing to a fixed object (table, pole) so that it is at the same height as your right / left elbow when you are standing or sitting on a firm surface.   Stand or sit so that the secured exercise band is at your right / left side.   Bend your elbow 90 degrees. Place a folded towel or small pillow under your right / left arm so that your elbow is a few inches away from your side.   Keeping the tension on the exercise band, pull it across your body,  toward your stomach. Be sure to keep your body steady, so that the movement is coming only from your rotating shoulder.   Hold for __________ seconds. Release the tension in a controlled manner, as you return to the starting position.  Repeat __________ times. Complete this exercise __________ times per day.  STRENGTH - Scapular Protractors, Standing   Stand arms length away from a wall. Place your hands on the wall, keeping your elbows straight.   Begin by dropping your shoulder blades down and toward your mid-back spine.   To strengthen your protractors, keep your shoulder blades down, but slide them forward on your rib cage. It will feel as if you are lifting the back of your rib cage away from the wall. This is a subtle motion and can be challenging to complete. Ask your caregiver for further instruction, if you are not sure you are doing the exercise correctly.   Hold for __________ seconds. Slowly return to the starting position, resting the muscles completely before starting the next repetition.  Repeat __________ times. Complete this exercise __________ times per day. STRENGTH - Scapular Protractors, Supine  Lie on your back on a firm surface. Extend your right / left arm straight into the air while holding a __________ weight in your hand.   Keeping your head and back in place, lift your shoulder off the floor.   Hold for __________ seconds. Slowly return to the starting position, and allow your muscles to relax completely before starting the next repetition.  Repeat __________ times. Complete this exercise __________ times per day. STRENGTH - Scapular Protractors, Quadruped  Get onto your hands and knees, with your shoulders directly over your hands (or as close as you can   be, comfortably).   Keeping your elbows locked, lift the back of your rib cage up into your shoulder blades, so your mid-back rounds out. Keep your neck muscles relaxed.   Hold this position for __________  seconds. Slowly return to the starting position and allow your muscles to relax completely before starting the next repetition.  Repeat __________ times. Complete this exercise __________ times per day.  STRENGTH - Scapular Retractors  Secure a rubber exercise band or tubing to a fixed object (table, pole), so that it is at the height of your shoulders when you are either standing, or sitting on a firm armless chair.   With a palm down grip, grasp an end of the band in each hand. Straighten your elbows and lift your hands straight in front of you, at shoulder height. Step back, away from the secured end of the band, until it becomes tense.   Squeezing your shoulder blades together, draw your elbows back toward your sides, as you bend them. Keep your upper arms lifted away from your body throughout the exercise.   Hold for __________ seconds. Slowly ease the tension on the band, as you reverse the directions and return to the starting position.  Repeat __________ times. Complete this exercise __________ times per day. STRENGTH - Shoulder Extensors   Secure a rubber exercise band or tubing to a fixed object (table, pole) so that it is at the height of your shoulders when you are either standing, or sitting on a firm armless chair.   With a thumbs-up grip, grasp an end of the band in each hand. Straighten your elbows and lift your hands straight in front of you, at shoulder height. Step back, away from the secured end of the band, until it becomes tense.   Squeezing your shoulder blades together, pull your hands down to the sides of your thighs. Do not allow your hands to go behind you.   Hold for __________ seconds. Slowly ease the tension on the band, as you reverse the directions and return to the starting position.  Repeat __________ times. Complete this exercise __________ times per day.  STRENGTH - Scapular Retractors and External Rotators   Secure a rubber exercise band or tubing to a  fixed object (table, pole) so that it is at the height as your shoulders, when you are either standing, or sitting on a firm armless chair.   With a palm down grip, grasp an end of the band in each hand. Bend your elbows 90 degrees and lift your elbows to shoulder height, at your sides. Step back, away from the secured end of the band, until it becomes tense.   Squeezing your shoulder blades together, rotate your shoulders so that your upper arms and elbows remain stationary, but your fists travel upward to head height.   Hold for __________ seconds. Slowly ease the tension on the band, as you reverse the directions and return to the starting position.  Repeat __________ times. Complete this exercise __________ times per day.  STRENGTH - Scapular Retractors and External Rotators, Rowing   Secure a rubber exercise band or tubing to a fixed object (table, pole) so that it is at the height of your shoulders, when you are either standing, or sitting on a firm armless chair.   With a palm down grip, grasp an end of the band in each hand. Straighten your elbows and lift your hands straight in front of you, at shoulder height. Step back, away from the   secured end of the band, until it becomes tense.   Step 1: Squeeze your shoulder blades together. Bending your elbows, draw your hands to your chest, as if you are rowing a boat. At the end of this motion, your hands and elbow should be at shoulder height and your elbows should be out to your sides.   Step 2: Rotate your shoulders, to raise your hands above your head. Your forearms should be vertical and your upper arms should be horizontal.   Hold for __________ seconds. Slowly ease the tension on the band, as you reverse the directions and return to the starting position.  Repeat __________ times. Complete this exercise __________ times per day.  STRENGTH - Scapular Depressors  Find a sturdy chair without wheels, such as a dining room chair.    Keeping your feet on the floor, and your hands on the chair arms, lift your bottom up from the seat, and lock your elbows.   Keeping your elbows straight, allow gravity to pull your body weight down. Your shoulders will rise toward your ears.   Raise your body against gravity by drawing your shoulder blades down your back, shortening the distance between your shoulders and ears. Although your feet should always maintain contact with the floor, your feet should progressively support less body weight, as you get stronger.   Hold for __________ seconds. In a controlled and slow manner, lower your body weight to begin the next repetition.  Repeat __________ times. Complete this exercise __________ times per day.  Document Released: 03/03/2005 Document Revised: 02/20/2011 Document Reviewed: 06/15/2008 [employer] Patient Information 2012 [employer]. 

## 2011-11-24 NOTE — Addendum Note (Signed)
Addended by: Caryl Ada on: 11/24/2011 01:09 PM   Modules accepted: Orders

## 2011-12-01 ENCOUNTER — Other Ambulatory Visit: Payer: Self-pay | Admitting: *Deleted

## 2011-12-01 ENCOUNTER — Telehealth: Payer: Self-pay

## 2011-12-01 DIAGNOSIS — F329 Major depressive disorder, single episode, unspecified: Secondary | ICD-10-CM

## 2011-12-01 DIAGNOSIS — F419 Anxiety disorder, unspecified: Secondary | ICD-10-CM

## 2011-12-01 MED ORDER — FLUOXETINE HCL 10 MG PO TABS: 10.0000 mg | ORAL_TABLET | Freq: Every day | ORAL | Status: DC

## 2011-12-01 MED ORDER — FLUOXETINE HCL 10 MG PO TABS: 10.0000 mg | ORAL_TABLET | Freq: Two times a day (BID) | ORAL | Status: DC

## 2011-12-01 NOTE — Telephone Encounter (Signed)
Patient is in increased pain.  Injections did not help.  She is in pain and hurts.   She needs something.  Please advise.

## 2011-12-02 NOTE — Telephone Encounter (Signed)
We can initiate a fentanyl patch q72 hours. Otherwise, i'm not sure what to offer her. She seems to typically go from "crisis state" to "crisis state".

## 2011-12-02 NOTE — Telephone Encounter (Signed)
Lm for patient to call office about medication.

## 2011-12-03 ENCOUNTER — Telehealth: Payer: Self-pay | Admitting: Physical Medicine & Rehabilitation

## 2011-12-03 DIAGNOSIS — F419 Anxiety disorder, unspecified: Secondary | ICD-10-CM

## 2011-12-03 DIAGNOSIS — M7530 Calcific tendinitis of unspecified shoulder: Secondary | ICD-10-CM

## 2011-12-03 DIAGNOSIS — M170 Bilateral primary osteoarthritis of knee: Secondary | ICD-10-CM

## 2011-12-03 DIAGNOSIS — M47812 Spondylosis without myelopathy or radiculopathy, cervical region: Secondary | ICD-10-CM

## 2011-12-03 DIAGNOSIS — IMO0001 Reserved for inherently not codable concepts without codable children: Secondary | ICD-10-CM

## 2011-12-03 MED ORDER — FENTANYL 25 MCG/HR TD PT72: 1.0000 | MEDICATED_PATCH | TRANSDERMAL | Status: DC

## 2011-12-03 MED ORDER — OXYCODONE HCL 10 MG PO TABS: 10.0000 mg | ORAL_TABLET | Freq: Four times a day (QID) | ORAL | Status: DC | PRN

## 2011-12-03 NOTE — Telephone Encounter (Signed)
Pt aware that new prescription is ready to be picked up.

## 2011-12-03 NOTE — Telephone Encounter (Signed)
Pt is currently on Oxycodone 5/325 1 Q 6hrs PRN. Please advise.

## 2011-12-03 NOTE — Telephone Encounter (Signed)
Needs stronger pain med.  The one she is on is NOT working.  The shot did not work.  Please call.

## 2011-12-03 NOTE — Telephone Encounter (Signed)
Script printed.  Unable to contact patient.  Script at the front for pick up.

## 2011-12-03 NOTE — Telephone Encounter (Signed)
Can change to oxy IR 10mg 

## 2011-12-04 ENCOUNTER — Other Ambulatory Visit: Payer: Self-pay

## 2011-12-04 MED ORDER — FENTANYL 25 MCG/HR TD PT72: 1.0000 | MEDICATED_PATCH | TRANSDERMAL | Status: DC

## 2011-12-10 ENCOUNTER — Telehealth: Payer: Self-pay

## 2011-12-10 NOTE — Telephone Encounter (Signed)
Patient called to get status of prior auth for patches.

## 2011-12-11 NOTE — Telephone Encounter (Signed)
Patient informed request has been sent and we are waiting on approval.

## 2011-12-19 ENCOUNTER — Telehealth: Payer: Self-pay | Admitting: *Deleted

## 2011-12-19 NOTE — Telephone Encounter (Signed)
Still having trouble getting her prescription filled. Please call.

## 2011-12-19 NOTE — Telephone Encounter (Signed)
I have faxed a paper copy to Medicaid. This was done initially on cover my meds online.

## 2011-12-19 NOTE — Telephone Encounter (Signed)
Medicaid states they haven't received prior auth. Please fax to [phone]

## 2011-12-19 NOTE — Telephone Encounter (Signed)
I advised the pt that we have done the prior auth and we are waiting on Medicaid. She is more than welcome to call Medicaid herself.

## 2011-12-23 ENCOUNTER — Encounter: Payer: Medicaid Other | Admitting: Physical Medicine & Rehabilitation

## 2011-12-24 ENCOUNTER — Encounter: Payer: Medicaid Other | Attending: Physical Medicine & Rehabilitation | Admitting: Physical Medicine & Rehabilitation

## 2011-12-24 ENCOUNTER — Encounter: Payer: Self-pay | Admitting: Physical Medicine & Rehabilitation

## 2011-12-24 VITALS — BP 120/70 | HR 70 | Resp 16 | Ht 63.0 in | Wt 187.0 lb

## 2011-12-24 DIAGNOSIS — M1710 Unilateral primary osteoarthritis, unspecified knee: Secondary | ICD-10-CM | POA: Insufficient documentation

## 2011-12-24 DIAGNOSIS — M7530 Calcific tendinitis of unspecified shoulder: Secondary | ICD-10-CM | POA: Insufficient documentation

## 2011-12-24 DIAGNOSIS — M170 Bilateral primary osteoarthritis of knee: Secondary | ICD-10-CM

## 2011-12-24 DIAGNOSIS — IMO0002 Reserved for concepts with insufficient information to code with codable children: Secondary | ICD-10-CM | POA: Insufficient documentation

## 2011-12-24 DIAGNOSIS — M47812 Spondylosis without myelopathy or radiculopathy, cervical region: Secondary | ICD-10-CM

## 2011-12-24 DIAGNOSIS — F329 Major depressive disorder, single episode, unspecified: Secondary | ICD-10-CM

## 2011-12-24 DIAGNOSIS — M7650 Patellar tendinitis, unspecified knee: Secondary | ICD-10-CM

## 2011-12-24 DIAGNOSIS — IMO0001 Reserved for inherently not codable concepts without codable children: Secondary | ICD-10-CM | POA: Insufficient documentation

## 2011-12-24 DIAGNOSIS — F341 Dysthymic disorder: Secondary | ICD-10-CM

## 2011-12-24 MED ORDER — OXYCODONE-ACETAMINOPHEN 10-325 MG PO TABS: 1.0000 | ORAL_TABLET | Freq: Four times a day (QID) | ORAL | Status: DC | PRN

## 2011-12-24 MED ORDER — CARISOPRODOL 350 MG PO TABS: 350.0000 mg | ORAL_TABLET | Freq: Three times a day (TID) | ORAL | Status: DC | PRN

## 2011-12-24 MED ORDER — FENTANYL 25 MCG/HR TD PT72: 1.0000 | MEDICATED_PATCH | TRANSDERMAL | Status: DC

## 2011-12-24 NOTE — Progress Notes (Signed)
  Subjective:    Patient ID: Summer Estrada, female    DOB: 02/23/1961, 51 y.o.   MRN: [id number]  HPI Pain Inventory Average Pain 8 Pain Right Now 8 My pain is constant, sharp, dull, stabbing, tingling and aching  In the last 24 hours, has pain interfered with the following? General activity 8 Relation with others 8 Enjoyment of life 8 What TIME of day is your pain at its worst? All Day Sleep (in general) Poor  Pain is worse with: unsure Pain improves with: medication Relief from Meds: 5  Mobility use a cane ability to climb steps?  no do you drive?  no  Function disabled: date disabled   Neuro/Psych No problems in this area  Prior Studies Any changes since last visit?  no  Physicians involved in your care Any changes since last visit?  no   Family History  Problem Relation Age of Onset  . Kidney disease Mother   . Heart disease Father    History   Social History  . Marital Status: Married    Spouse Name: N/A    Number of Children: N/A  . Years of Education: N/A   Social History Main Topics  . Smoking status: Current Every Day Smoker  . Smokeless tobacco: Never Used  . Alcohol Use: None  . Drug Use: None  . Sexually Active: None   Other Topics Concern  . None   Social History Narrative  . None   Past Surgical History  Procedure Date  . Total hip arthroplasty   . Cesarean section    Past Medical History  Diagnosis Date  . Depression   . Knee pain    There were no vitals taken for this visit.      Review of Systems  Constitutional: Negative.   HENT: Positive for neck pain and neck stiffness.   Eyes: Negative.   Cardiovascular: Negative.   Gastrointestinal: Negative.   Genitourinary: Negative.   Musculoskeletal: Positive for myalgias and arthralgias.  Skin: Negative.   Neurological: Negative.   Hematological: Negative.   Psychiatric/Behavioral: Negative.        Objective:   Physical Exam        Assessment & Plan:

## 2011-12-24 NOTE — Progress Notes (Signed)
Subjective:    Patient ID: Summer Estrada, female    DOB: 16-Oct-1960, 51 y.o.   MRN: [id number]  HPI  Yoshiko is back regarding her neck, shoulder and low back pain. Her right knee has been more tender. The injection worked temporarily but that is all. Her shoulder overal is feeling better. For some reason she did not receive approvla of her fentanyl patch.   Stress levels are improved at home.  She tries to keep active with her pets/family. She has a difficult time doing it due to her pain.  Pain Inventory Average Pain 8 Pain Right Now 8 My pain is constant, sharp, dull, stabbing, tingling and aching  In the last 24 hours, has pain interfered with the following? General activity 8 Relation with others 8 Enjoyment of life 8 What TIME of day is your pain at its worst? all the time Sleep (in general) Poor  Pain is worse with: walking, bending, sitting, inactivity, standing and some activites Pain improves with: nothing Relief from Meds: 0  Mobility use a cane how many minutes can you walk? 2 ability to climb steps?  no do you drive?  no Do you have any goals in this area?  yes  Function disabled: date disabled   Neuro/Psych No problems in this area  Prior Studies Any changes since last visit?  no  Physicians involved in your care Any changes since last visit?  no   Family History  Problem Relation Age of Onset  . Kidney disease Mother   . Heart disease Father    History   Social History  . Marital Status: Married    Spouse Name: N/A    Number of Children: N/A  . Years of Education: N/A   Social History Main Topics  . Smoking status: Current Every Day Smoker  . Smokeless tobacco: Never Used  . Alcohol Use: None  . Drug Use: None  . Sexually Active: None   Other Topics Concern  . None   Social History Narrative  . None   Past Surgical History  Procedure Date  . Total hip arthroplasty   . Cesarean section    Past Medical History  Diagnosis  Date  . Depression   . Knee pain    BP 120/70  Pulse 70  Resp 16  Ht 5\' 3"  (1.6 m)  Wt 187 lb (84.823 kg)  BMI 33.13 kg/m2  SpO2 98%     Review of Systems     Objective:   Physical Exam Physical Exam  Constitutional: She is oriented to person, place, and time. She appears well-developed and well-nourished.  HENT:  Head: Normocephalic and atraumatic.  Eyes: Conjunctivae and EOM are normal. Pupils are equal, round, and reactive to light. No scleral icterus.  Cardiovascular: Normal rate and regular rhythm.  Pulmonary/Chest: Effort normal and breath sounds normal.  Abdominal: Bowel sounds are normal.  Musculoskeletal: Normal range of motion.  Spurling's test is negative. Rotator cuff impingement maneuvers were positve but improved.. She's slightly tender at the left [facility] joint. Apprehension test was negative. She had pain with palpation over the acromium into the subdeltoid bursa area. Drop arm test was negative.   Increased pain in the right knee with AROM, PROM. Mild crepitus appreciated. pain with weightbearing and meniscal maneuvers. Mild swelling noted below the patella. The patellar tendon is tender to touch. Mild laxity anteriorly. Mild joint line tenderness medially and laterally..  Neurological: She is alert and oriented to person, place, and time. She  has normal strength and normal reflexes. No cranial nerve deficit or sensory deficit.  Psychiatric: Her speech is normal. Thought content normal. Her mood appears anxious. Cognition and memory are normal. She exhibits improved mood and is more relaxed.  Assessment & Plan:   ASSESSMENT:  1. Chronic rotator cuff syndrome with myofascial shoulder girdle pain- i think this is more the source of her pain at present.  2. 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  3. Anxiety and depression.  4. Mild ostearthritis of the knees. Likley has meniscal injury of the right knee.    5. Right Patellar tendonitis, bursitis. This appears to be more of the cause of her acute knee pain at this time.   PLAN:  1. After informed consent i injected around the right patellar tendon medially with 40mg  methylprednisolone and 3cc 1%lidocaine.  2. Voltaren gel to knees qid, also use ice.  3. Will attempt fentanyl again (was an authorization issue previously). I am not sure why it wouldn't be authorized by [facility].   4. Percocet for breakthrough pain. #75 10/325 Was refilled today.  5. Knee exercises and continued ambulation as possible. Needs to know limits!  6. Follow up with me in one month  7. Stil May have to consider surgical evaluation as well especially for the C5-C6 level. However, the patient is not having any neurological symptoms at present and I think the shoulder pain is primarily RTC.  8. Consider MRI of right knee if pain is persistent. 9. Follow up with me in a month.

## 2011-12-24 NOTE — Patient Instructions (Signed)
 Patellar Tendinitis, Jumper's Knee with Rehab Tendinitis is inflammation of a tendon. Tendonitis of the tendon below the kneecap (patella) is known as patellar tendonitis. Patellar tendonitis is a common cause of pain below the kneecap (infrapatella). Patellar tendonitis may involve a tear (strain) in the ligament. Strains are classified into three categories. Grade 1 strains cause pain, but the tendon is not lengthened. Grade 2 strains include a lengthened ligament, due to the ligament being stretched or partially ruptured. With grade 2 strains there is still function, although function may be decreased. Grade 3 strains involve a complete tear of the tendon or muscle, and function is usually impaired. Patellar tendon strains are usually grade 1 or 2.  SYMPTOMS   Pain, tenderness, swelling, warmth, or redness over the patellar tendon (just below the kneecap).  Pain and loss of strength (sometimes), with forcefully straightening the knee (especially when jumping or rising from a seated or squatting position), or bending the knee completely (squatting or kneeling).  Crackling sound (crepitation) when the tendon is moved or touched. CAUSES  Patellar tendonitis is caused by injury to the patellar tendon. The inflammation is the body's healing response. Common causes of injury include:  Stress from a sudden increase in intensity, frequency, or duration of training.  Overuse of the quadriceps thigh muscles and patellar tendon.  Direct hit (trauma) to the knee or patellar tendon. RISK INCREASES WITH:  Sports that require sudden, explosive thigh muscle (quadriceps) contraction, such as jumping, quick starts, or kicking.  Running sports, especially running down hills.  Poor strength and flexibility of the thigh and knee.  Flat feet. PREVENTION  Warm up and stretch properly before activity.  Allow for adequate recovery between workouts.  Maintain physical fitness:  Strength, flexibility,  and endurance.  Cardiovascular fitness.  Protect the knee joint with taping, protective strapping, bracing, or elastic compression bandage.  Wear arch supports (orthotics). PROGNOSIS  If treated properly, patellar tendonitis usually heals within 6 weeks.  RELATED COMPLICATIONS   Longer healing time, if not properly treated or if not given enough time to heal.  Recurring symptoms, if activity is resumed too soon, with overuse, with a direct blow, or when using poor technique.  If untreated, tendon rupture requiring surgery. TREATMENT Treatment first involves the use of ice and medicine, to reduce pain and inflammation. The use of strengthening and stretching exercises may help reduce pain with activity. These exercises may be performed at home or with a therapist. Serious cases of tendonitis may require restraining the knee for 10 to 14 days, to prevent stress on the tendon and to promote healing. Crutches may be used (uncommon) until you can walk without a limp. For cases in which non-surgical treatment is unsuccessful, surgery may be advised, to remove the inflamed tendon lining (sheath). Surgery is rare, and is only advised after at least 6 months of non-surgical treatment. MEDICATION   If pain medicine is needed, nonsteroidal anti-inflammatory medicines (aspirin and ibuprofen), or other minor pain relievers (acetaminophen), are often advised.  Do not take pain medicine for 7 days before surgery.  Prescription pain relievers may be given, if your caregiver thinks they are needed. Use only as directed and only as much as you need. HEAT AND COLD  Cold treatment (icing) should be applied for 10 to 15 minutes every 2 to 3 hours for inflammation and pain, and immediately after activity that aggravates your symptoms. Use ice packs or an ice massage.  Heat treatment may be used before performing stretching   and strengthening activities prescribed by your caregiver, physical therapist, or  athletic trainer. Use a heat pack or a warm water soak. SEEK MEDICAL CARE IF:  Symptoms get worse or do not improve in 2 weeks, despite treatment.  New, unexplained symptoms develop. (Drugs used in treatment may produce side effects.) EXERCISES RANGE OF MOTION (ROM) AND STRETCHING EXERCISES - Patellar Tendinitis (Jumper's Knee) These are some of the initial exercises with which you may start your rehabilitation program, until you see your caregiver again or until your symptoms are resolved. Remember:   Flexible tissue is more tolerant of the stresses placed on it during activities.  Each stretch should be held for 20 to 30 seconds.  A gentle stretching sensation should be felt. STRETCH Hamstrings, Supine  Lie on your back. Loop a belt or towel over the ball of your right / left foot.  Straighten your right / left knee and slowly pull on the belt to raise your leg. Do not allow the right / left knee to bend. Keep your opposite leg flat on the floor.  Raise the leg until you feel a gentle stretch behind your right / left knee or thigh. Hold this position for __________ seconds. Repeat __________ times. Complete this stretch __________ times per day.  STRETCH - Hamstrings, Doorway  Lie on your back with your right / left leg extended and resting on the wall, and the opposite leg flat on the ground through the door. At first, position your bottom farther away from the wall.  Keep your right / left knee straight. If you feel a stretch behind your knee or thigh, hold this position for __________ seconds.  If you do not feel a stretch, scoot your bottom closer to the door, and hold __________ seconds. Repeat __________ times. Complete this stretch __________ times per day.  STRETCH - Hamstrings, Standing  Stand or sit and extend your right / left leg, placing your foot on a chair or foot stool.  Keep a slight arch in your low back and your hips straight forward.  Lead with your chest  and lean forward at the waist until you feel a gentle stretch in the back of your right / left knee or thigh. (When done correctly, this exercise requires leaning only a small distance.)  Hold this position for __________ seconds. Repeat __________ times. Complete this stretch __________ times per day. STRETCH - Adductors, Lunge  While standing, spread your legs, with your right / left leg behind you.  Lean away from your right / left leg by bending your opposite knee. You may rest your hands on your thigh for balance.  You should feel a stretch in your right / left inner thigh. Hold for __________ seconds. Repeat __________ times. Complete this exercise __________ times per day.  STRENGTHENING EXERCISES - Patellar Tendinitis (Jumper's Knee) These exercises may help you when beginning to rehabilitate your injury. They may resolve your symptoms with or without further involvement from your physician, physical therapist or athletic trainer. While completing these exercises, remember:   Muscles can gain both the endurance and the strength needed for everyday activities through controlled exercises.  Complete these exercises as instructed by your physician, physical therapist or athletic trainer. Increase the resistance and repetitions only as guided by your caregiver. STRENGTH - Quadriceps, Isometrics  Lie on your back with your right / left leg extended and your opposite knee bent.  Gradually tense the muscles in the front of your right / left thigh. You   should see either your kneecap slide up toward your hip or increased dimpling just above the knee. This motion will push the back of the knee down toward the floor, mat, or bed on which you are lying.  Hold the muscle as tight as you can, without increasing your pain, for __________ seconds.  Relax the muscles slowly and completely in between each repetition. Repeat __________ times. Complete this exercise __________ times per day.    STRENGTH - Quadriceps, Short Arcs  Lie on your back. Place a __________ inch towel roll under your right / left knee, so that the knee bends slightly.  Raise only your lower leg by tightening the muscles in the front of your thigh. Do not allow your thigh to rise.  Hold this position for __________ seconds. Repeat __________ times. Complete this exercise __________ times per day.  OPTIONAL ANKLE WEIGHTS: Begin with ____________________, but DO NOT exceed ____________________. Increase in 1 pound/ 0.5 kilogram increments. STRENGTH - Quadriceps, Straight Leg Raises  Quality counts! Watch for signs that the quadriceps muscle is working, to be sure you are strengthening the correct muscles and not "cheating" by substituting with healthier muscles.  Lay on your back with your right / left leg extended and your opposite knee bent.  Tense the muscles in the front of your right / left thigh. You should see either your kneecap slide up or increased dimpling just above the knee. Your thigh may even shake a bit.  Tighten these muscles even more and raise your leg 4 to 6 inches off the floor. Hold for __________ seconds.  Keeping these muscles tense, lower your leg.  Relax the muscles slowly and completely between each repetition. Repeat __________ times. Complete this exercise __________ times per day.  STRENGTH  Quadriceps, Squats  Stand in a door frame so that your feet and knees are in line with the frame.  Use your hands for balance, not support, on the frame.  Slowly lower your weight, bending at the hips and knees. Keep your lower legs upright so that they are parallel with the door frame. Squat only within the range that does not increase your knee pain. Never let your hips drop below your knees.  Slowly return upright, pushing with your legs, not pulling with your hands. Repeat __________ times. Complete this exercise __________ times per day.  STRENGTH  Quadriceps, Step-Downs  Stand  on the edge of a step stool or stair. Be prepared to use a countertop or wall for balance, if needed.  Keeping your right / left knee directly over the middle of your foot, slowly touch your opposite heel to the floor or lower step. Do not go all the way to the floor if your knee pain increases, just go as far as you can without increased discomfort. Use your right / left leg muscles, not gravity to lower your body weight.  Slowly push your body weight back up to the starting position, Repeat __________ times. Complete this exercise __________ times per day.  Document Released: 03/03/2005 Document Revised: 05/26/2011 Document Reviewed: 06/15/2008 [employer] Patient Information 2013 [employer].  

## 2012-01-01 ENCOUNTER — Telehealth: Payer: Self-pay | Admitting: Physical Medicine & Rehabilitation

## 2012-01-01 NOTE — Telephone Encounter (Signed)
Still having trouble getting Fentanyl.  Moved her to [facility] [place] [phone].  We are to fax to [employer], they respond back and then we contact pharmacy.

## 2012-01-01 NOTE — Telephone Encounter (Signed)
Re applied to medicaid for the prior auth for fentanyl

## 2012-01-16 ENCOUNTER — Other Ambulatory Visit: Payer: Self-pay | Admitting: Physical Medicine & Rehabilitation

## 2012-01-20 ENCOUNTER — Encounter: Payer: Medicaid Other | Attending: Physical Medicine & Rehabilitation | Admitting: Physical Medicine & Rehabilitation

## 2012-01-20 ENCOUNTER — Encounter: Payer: Self-pay | Admitting: Physical Medicine & Rehabilitation

## 2012-01-20 VITALS — BP 116/71 | HR 69 | Resp 18 | Ht 63.0 in | Wt 187.0 lb

## 2012-01-20 DIAGNOSIS — M47812 Spondylosis without myelopathy or radiculopathy, cervical region: Secondary | ICD-10-CM

## 2012-01-20 DIAGNOSIS — IMO0001 Reserved for inherently not codable concepts without codable children: Secondary | ICD-10-CM

## 2012-01-20 DIAGNOSIS — F329 Major depressive disorder, single episode, unspecified: Secondary | ICD-10-CM

## 2012-01-20 DIAGNOSIS — F32A Depression, unspecified: Secondary | ICD-10-CM

## 2012-01-20 DIAGNOSIS — IMO0002 Reserved for concepts with insufficient information to code with codable children: Secondary | ICD-10-CM

## 2012-01-20 DIAGNOSIS — F341 Dysthymic disorder: Secondary | ICD-10-CM

## 2012-01-20 DIAGNOSIS — M7650 Patellar tendinitis, unspecified knee: Secondary | ICD-10-CM

## 2012-01-20 DIAGNOSIS — M1710 Unilateral primary osteoarthritis, unspecified knee: Secondary | ICD-10-CM | POA: Insufficient documentation

## 2012-01-20 DIAGNOSIS — M170 Bilateral primary osteoarthritis of knee: Secondary | ICD-10-CM

## 2012-01-20 DIAGNOSIS — M7530 Calcific tendinitis of unspecified shoulder: Secondary | ICD-10-CM

## 2012-01-20 MED ORDER — OXYCODONE-ACETAMINOPHEN 10-325 MG PO TABS: 1.0000 | ORAL_TABLET | Freq: Four times a day (QID) | ORAL | Status: DC | PRN

## 2012-01-20 MED ORDER — FENTANYL 25 MCG/HR TD PT72: 1.0000 | MEDICATED_PATCH | TRANSDERMAL | Status: DC

## 2012-01-20 NOTE — Progress Notes (Signed)
Subjective:   Summer Estrada is back regarding her chronic pain. She had a "couple"days of relief with the injection which she decreased by 80%. She fell on her left knee a couple days ago. She's using the voltaren and ice for her knees also. She is also walking routinely.   Her pain is worst in the evening and affects sleep. She has more pain when she's more active. She finds it difficult bear weight on the leg. Stairs are painful.   Her left shoulder remains problematic as well.  She feels down, because she wants to remain active and is not ready to "give up" on an active lifestyle. Pain Inventory Average Pain 10 Pain Right Now 10 My pain is sharp, stabbing and aching  In the last 24 hours, has pain interfered with the following? General activity 8 Relation with others 8 Enjoyment of life 8 What TIME of day is your pain at its worst? in the morning and evening Sleep (in general) Poor  Pain is worse with: "everything" Pain improves with: heat/ice, medication and "the right position" Relief from Meds: 5  Mobility use a cane how many minutes can you walk? 0 ability to climb steps?  yes do you drive?  no  Function Do you have any goals in this area?  no  Neuro/Psych No problems in this area  Prior Studies Any changes since last visit?  no  Physicians involved in your care Any changes since last visit?  no   Family History  Problem Relation Age of Onset  . Kidney disease Mother   . Heart disease Father    History   Social History  . Marital Status: Married    Spouse Name: N/A    Number of Children: N/A  . Years of Education: N/A   Social History Main Topics  . Smoking status: Current Every Day Smoker  . Smokeless tobacco: Never Used  . Alcohol Use: Not on file  . Drug Use: Not on file  . Sexually Active: Not on file   Other Topics Concern  . Not on file   Social History Narrative  . No narrative on file   Past Surgical History  Procedure Date  . Total  hip arthroplasty   . Cesarean section    Past Medical History  Diagnosis Date  . Depression   . Knee pain   . Arthritis    BP 116/71  Pulse 69  Resp 18  Ht 5\' 3"  (1.6 m)  Wt 187 lb (84.823 kg)  BMI 33.13 kg/m2  SpO2 98%    Patient ID: Summer Estrada, female    DOB: Jul 24, 1960, 51 y.o.   MRN: [id number]  HPI    Review of Systems  Constitutional: Negative.   HENT: Negative.   Eyes: Negative.   Respiratory: Negative.   Cardiovascular: Negative.   Gastrointestinal: Negative.   Genitourinary: Negative.   Musculoskeletal: Positive for myalgias, arthralgias and gait problem.  Skin: Negative.   Neurological: Negative.   Hematological: Negative.   Psychiatric/Behavioral: Negative.        Objective:   Physical Exam  Physical Exam  Constitutional: She is oriented to person, place, and time. She appears well-developed and well-nourished.  HENT:  Head: Normocephalic and atraumatic.  Eyes: Conjunctivae and EOM are normal. Pupils are equal, round, and reactive to light. No scleral icterus.  Cardiovascular: Normal rate and regular rhythm.  Pulmonary/Chest: Effort normal and breath sounds normal.  Abdominal: Bowel sounds are normal.  Musculoskeletal: Normal range of motion.  Spurling's test is negative. Rotator cuff impingement maneuvers were positve but improved.. She's slightly tender at the left [facility] joint. Apprehension test was negative. She had pain with palpation over the acromium into the subdeltoid bursa area. Drop arm test was negative.   Increased pain in the right knee with AROM, PROM. Mild crepitus appreciated. pain with weightbearing and meniscal maneuvers. Patellar tendon less tender, but medial joint line very senstiive today.  Mild swelling noted around, below the patella.  Neurological: She is alert and oriented to person, place, and time. She has normal strength and normal reflexes. No cranial nerve deficit or sensory deficit.  Psychiatric: Her speech is  normal. Thought content normal. Her mood appears anxious. Cognition and memory are normal. She exhibits improved mood and is more relaxed.  Assessment & Plan:   ASSESSMENT:  1. Chronic rotator cuff syndrome with myofascial shoulder girdle pain- i think this is more the source of her pain at present.  2. 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  3. Anxiety and depression.  4. OA/meniscal injury right knee, likely medial meniscus, medial joint disease 5. Right Patellar tendonitis, bursitis. This area is improved   PLAN:  1.Given that pain is persistent, I have ordered an MRI of the right knee to assess mensisci. Likely will need surgical eval. 2. Voltaren gel to knees qid, also use ice.  3. Will attempt fentanyl once again , 4. Percocet for breakthrough pain. #75 10/325 Was refilled today.  5. Knee exercises in a closed kinetic chain. Given her gait pattern, I don't want her walking extensively, as it's likely doing more harm than good.  6. Follow up with me in one month  7. Stil   consider surgical evaluation as well especially for the C5-C6 level. However, the patient is not having any neurological symptoms at present and I think the shoulder pain is primarily RTC.   8. 15 minutes of face to face patient care time were spent during this visit. All questions were encouraged and answered.

## 2012-01-20 NOTE — Patient Instructions (Signed)
Keep your walking limited at this time.

## 2012-01-27 ENCOUNTER — Telehealth: Payer: Self-pay | Admitting: Physical Medicine & Rehabilitation

## 2012-01-27 NOTE — Telephone Encounter (Signed)
Medicaid has denied MRI of knee stating it does not meet criteria.  Please advise.

## 2012-01-29 NOTE — Telephone Encounter (Signed)
Someone needs to advise me of the "criteria" as i have gone through the appropriate steps leading up to this MRI order as far as i'm concerned

## 2012-02-20 ENCOUNTER — Encounter: Payer: Medicaid Other | Attending: Physical Medicine & Rehabilitation | Admitting: Physical Medicine & Rehabilitation

## 2012-02-20 ENCOUNTER — Encounter: Payer: Self-pay | Admitting: Physical Medicine & Rehabilitation

## 2012-02-20 VITALS — BP 119/84 | HR 74 | Resp 14 | Ht 63.0 in | Wt 187.0 lb

## 2012-02-20 DIAGNOSIS — M7530 Calcific tendinitis of unspecified shoulder: Secondary | ICD-10-CM

## 2012-02-20 DIAGNOSIS — M170 Bilateral primary osteoarthritis of knee: Secondary | ICD-10-CM

## 2012-02-20 DIAGNOSIS — M47812 Spondylosis without myelopathy or radiculopathy, cervical region: Secondary | ICD-10-CM

## 2012-02-20 DIAGNOSIS — F341 Dysthymic disorder: Secondary | ICD-10-CM

## 2012-02-20 DIAGNOSIS — G4700 Insomnia, unspecified: Secondary | ICD-10-CM

## 2012-02-20 DIAGNOSIS — F32A Depression, unspecified: Secondary | ICD-10-CM

## 2012-02-20 DIAGNOSIS — F329 Major depressive disorder, single episode, unspecified: Secondary | ICD-10-CM

## 2012-02-20 DIAGNOSIS — IMO0002 Reserved for concepts with insufficient information to code with codable children: Secondary | ICD-10-CM | POA: Insufficient documentation

## 2012-02-20 DIAGNOSIS — IMO0001 Reserved for inherently not codable concepts without codable children: Secondary | ICD-10-CM

## 2012-02-20 DIAGNOSIS — M1710 Unilateral primary osteoarthritis, unspecified knee: Secondary | ICD-10-CM | POA: Insufficient documentation

## 2012-02-20 DIAGNOSIS — M7650 Patellar tendinitis, unspecified knee: Secondary | ICD-10-CM

## 2012-02-20 MED ORDER — OXYCODONE-ACETAMINOPHEN 10-325 MG PO TABS: 1.0000 | ORAL_TABLET | Freq: Four times a day (QID) | ORAL | Status: DC | PRN

## 2012-02-20 MED ORDER — FENTANYL 25 MCG/HR TD PT72: 1.0000 | MEDICATED_PATCH | TRANSDERMAL | Status: DC

## 2012-02-20 MED ORDER — TRAZODONE HCL 50 MG PO TABS: 50.0000 mg | ORAL_TABLET | Freq: Every day | ORAL | Status: DC

## 2012-02-20 NOTE — Progress Notes (Signed)
Subjective:    Patient ID: Summer Estrada, female    DOB: 1960-06-13, 51 y.o.   MRN: [id number]  HPI  Sydny is back regarding her chronic pain. Her knee has been a little better as she has been staying off of it relatively. Her MRI was denied by [facility] due to the fact that we didn't meet certain criteria. Her left shoulder is bothering her again and often keeps her up at night. The shoulder bothers her with any attempt at active ROM.  Right knee tends lock up at times if she is walking or trying to navigate steps. It has given away when she walks as well. She reports grinding in the knee. As stated above the knee feels better if she rests the leg.   Pain Inventory Average Pain 10 Pain Right Now 10 My pain is sharp, dull, stabbing and aching  In the last 24 hours, has pain interfered with the following? General activity 8 Relation with others 8 Enjoyment of life 8 What TIME of day is your pain at its worst? all the time Sleep (in general) Poor  Pain is worse with: some activites Pain improves with: medication and injections Relief from Meds: 4  Mobility use a cane do you drive?  no Do you have any goals in this area?  yes  Function Do you have any goals in this area?  no  Neuro/Psych No problems in this area  Prior Studies Any changes since last visit?  no  Physicians involved in your care Any changes since last visit?  no   Family History  Problem Relation Age of Onset  . Kidney disease Mother   . Heart disease Father    History   Social History  . Marital Status: Married    Spouse Name: N/A    Number of Children: N/A  . Years of Education: N/A   Social History Main Topics  . Smoking status: Current Every Day Smoker  . Smokeless tobacco: Never Used  . Alcohol Use: None  . Drug Use: None  . Sexually Active: None   Other Topics Concern  . None   Social History Narrative  . None   Past Surgical History  Procedure Date  . Total hip arthroplasty    . Cesarean section    Past Medical History  Diagnosis Date  . Depression   . Knee pain   . Arthritis    BP 119/84  Pulse 74  Resp 14  Ht 5\' 3"  (1.6 m)  Wt 187 lb (84.823 kg)  BMI 33.13 kg/m2  SpO2 94%     Review of Systems  HENT: Positive for neck pain.   Musculoskeletal: Positive for back pain.  Psychiatric/Behavioral: Positive for dysphoric mood.  All other systems reviewed and are negative.       Objective:   Physical Exam Constitutional: She is oriented to person, place, and time. She appears well-developed and well-nourished.  HENT:  Head: Normocephalic and atraumatic.  Eyes: Conjunctivae and EOM are normal. Pupils are equal, round, and reactive to light. No scleral icterus.  Cardiovascular: Normal rate and regular rhythm.  Pulmonary/Chest: Effort normal and breath sounds normal.  Abdominal: Bowel sounds are normal.  Musculoskeletal: Normal range of motion.  Spurling's test is negative. Rotator cuff impingement maneuvers were positve but improved.. She's slightly tender at the left [facility] joint. Apprehension test was negative. She had pain with palpation over the acromium into the subdeltoid bursa area. Drop arm test was negative.   Increased  pain in the right knee with AROM, PROM.   crepitus appreciated with all movements. The leg did lock momentarily with flexion past 90 degrees. She has significant joint line tenderness especially along the medial aspect of the knee. Mild swelling noted around and below the patella. McMurray's test was positive medially more than laterally at the right knee. She walks with significant antalgia on the right. She uses her cane to unload the limb.  Neurological: She is alert and oriented to person, place, and time. She has normal strength and normal reflexes. No cranial nerve deficit or sensory deficit.  Psychiatric: Her speech is normal. Thought content normal. Her mood appears anxious. Cognition and memory are normal. She exhibits  improved mood and is more relaxed. She does become slightly emotional at times. Assessment & Plan:   ASSESSMENT:  1. Chronic left rotator cuff syndrome with myofascial shoulder girdle pain- i think this is more the source of her pain at present.  2. 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  3. Anxiety and depression.  4. OA/meniscal injury right knee, medial greater than lateral meniscus.  5. Right Patellar tendonitis, bursitis.   PLAN:  1.Given persistent knee pain and exam, I have ordered an MRI of the right knee to assess mensisci. Likely will need surgical eval. Advised her to try avoid excessive weight bearing and walking for the time being. 2. Voltaren gel to knees qid, also use ice.  3. Continue with fentanyl patch. She needs to be sure that the patch is adhering appropriately. We discussed potential ways to help with this. If it remains a problem, then we need to consider another agent. 4. Percocet for breakthrough pain. #75 10/325 Was refilled today.  5. Knee exercises in a closed kinetic chain only.    6. Follow up with me in one month  7. Stil consider surgical evaluation as well especially for the C5-C6 level. However, the patient is not having any neurological symptoms at present and I think the shoulder pain is primarily RTC.  8. 15 minutes of face to face patient care time were spent during this visit. All questions were encouraged and answered.  9. After informed consent i injected the left subacromial space with 40mg  kenalog and 3cc 1% lidocaine. The patient tolerated withtout any issues

## 2012-02-20 NOTE — Patient Instructions (Signed)
Be careful with excessive weight bearing through your right knee

## 2012-03-18 ENCOUNTER — Encounter
Payer: Medicaid Other | Attending: Physical Medicine and Rehabilitation | Admitting: Physical Medicine and Rehabilitation

## 2012-03-18 ENCOUNTER — Encounter: Payer: Self-pay | Admitting: Physical Medicine and Rehabilitation

## 2012-03-18 VITALS — BP 113/84 | HR 66 | Resp 14 | Ht 63.0 in | Wt 180.0 lb

## 2012-03-18 DIAGNOSIS — M719 Bursopathy, unspecified: Secondary | ICD-10-CM | POA: Insufficient documentation

## 2012-03-18 DIAGNOSIS — M25569 Pain in unspecified knee: Secondary | ICD-10-CM

## 2012-03-18 DIAGNOSIS — G8929 Other chronic pain: Secondary | ICD-10-CM | POA: Insufficient documentation

## 2012-03-18 DIAGNOSIS — M170 Bilateral primary osteoarthritis of knee: Secondary | ICD-10-CM

## 2012-03-18 DIAGNOSIS — F3289 Other specified depressive episodes: Secondary | ICD-10-CM | POA: Insufficient documentation

## 2012-03-18 DIAGNOSIS — M7530 Calcific tendinitis of unspecified shoulder: Secondary | ICD-10-CM

## 2012-03-18 DIAGNOSIS — M47812 Spondylosis without myelopathy or radiculopathy, cervical region: Secondary | ICD-10-CM | POA: Insufficient documentation

## 2012-03-18 DIAGNOSIS — M1710 Unilateral primary osteoarthritis, unspecified knee: Secondary | ICD-10-CM

## 2012-03-18 DIAGNOSIS — F329 Major depressive disorder, single episode, unspecified: Secondary | ICD-10-CM | POA: Insufficient documentation

## 2012-03-18 DIAGNOSIS — M67919 Unspecified disorder of synovium and tendon, unspecified shoulder: Secondary | ICD-10-CM | POA: Insufficient documentation

## 2012-03-18 DIAGNOSIS — IMO0001 Reserved for inherently not codable concepts without codable children: Secondary | ICD-10-CM

## 2012-03-18 DIAGNOSIS — M25561 Pain in right knee: Secondary | ICD-10-CM

## 2012-03-18 DIAGNOSIS — F411 Generalized anxiety disorder: Secondary | ICD-10-CM | POA: Insufficient documentation

## 2012-03-18 DIAGNOSIS — M4802 Spinal stenosis, cervical region: Secondary | ICD-10-CM | POA: Insufficient documentation

## 2012-03-18 DIAGNOSIS — M7650 Patellar tendinitis, unspecified knee: Secondary | ICD-10-CM

## 2012-03-18 MED ORDER — OXYCODONE-ACETAMINOPHEN 10-325 MG PO TABS: 1.0000 | ORAL_TABLET | Freq: Four times a day (QID) | ORAL | Status: DC | PRN

## 2012-03-18 MED ORDER — FENTANYL 25 MCG/HR TD PT72: 1.0000 | MEDICATED_PATCH | TRANSDERMAL | Status: DC

## 2012-03-18 NOTE — Progress Notes (Signed)
Subjective:    Patient ID: Summer Estrada, female    DOB: 19-Dec-1960, 52 y.o.   MRN: [id number]  HPI Idamae is back regarding her chronic pain. Her knee has been a little better as she has been staying off of it relatively. Her MRI was denied by [facility] due to the fact that we didn't meet certain criteria. Her left shoulder is bothering her still. again and often keeps her up at night.  Right knee tends to lock up at times if she is walking or trying to navigate steps. It has given away when she walks as well. She also  Reports, that she had some falls, because of bad balance.    Pain Inventory Average Pain 8 Pain Right Now 8 My pain is sharp, burning, dull, stabbing, tingling and aching  In the last 24 hours, has pain interfered with the following? General activity 8 Relation with others 9 Enjoyment of life 9 What TIME of day is your pain at its worst? night Sleep (in general) Poor  Pain is worse with: unsure Pain improves with: heat/ice and medication Relief from Meds: 7  Mobility use a cane ability to climb steps?  yes do you drive?  no  Function Do you have any goals in this area?  no  Neuro/Psych No problems in this area  Prior Studies Any changes since last visit?  no  Physicians involved in your care Any changes since last visit?  no   Family History  Problem Relation Age of Onset  . Kidney disease Mother   . Heart disease Father    History   Social History  . Marital Status: Married    Spouse Name: N/A    Number of Children: N/A  . Years of Education: N/A   Social History Main Topics  . Smoking status: Current Every Day Smoker  . Smokeless tobacco: Never Used  . Alcohol Use: None  . Drug Use: None  . Sexually Active: None   Other Topics Concern  . None   Social History Narrative  . None   Past Surgical History  Procedure Date  . Total hip arthroplasty   . Cesarean section    Past Medical History  Diagnosis Date  . Depression   .  Knee pain   . Arthritis    BP 113/84  Pulse 66  Resp 14  Ht 5\' 3"  (1.6 m)  Wt 180 lb (81.647 kg)  BMI 31.89 kg/m2  SpO2 92%    Review of Systems  Musculoskeletal: Positive for myalgias, arthralgias and gait problem.  All other systems reviewed and are negative.       Objective:   Physical Exam  Constitutional: She is oriented to person, place, and time. She appears well-developed and well-nourished.       Walks with a cane  HENT:  Head: Normocephalic.  Musculoskeletal: She exhibits tenderness.  Neurological: She is alert and oriented to person, place, and time.  Skin: Skin is warm and dry.  Psychiatric:       agitated    Symmetric normal motor tone is noted throughout. Normal muscle bulk. Muscle strength grossly intact. ROM of spine is restricted. Fine motor movements are normal in both hands. Sensory is intact and symmetric to light touch, pinprick and proprioception. DTR in the upper and lower extremity are present and symmetric 2-3+. No clonus is noted.  Patient arises from chair with mild difficulty. Wide based gait with a cane.  . Tandem walk is very difficult,  only possible with some help. No pronator drift. Rhomberg negative.        Assessment & Plan:  1. Chronic left rotator cuff syndrome with myofascial shoulder girdle pain- i think this is more the source of her pain at present.  2. 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  3. Anxiety and depression.  4. OA/meniscal injury right knee, medial greater than lateral meniscus.   PLAN:  1.Given persistent knee pain and exam, I have ordered an MRI of the right knee to assess mensisci. This has not been approved yet. Likely will need surgical eval. Advised her to try avoid excessive weight bearing and walking for the time being.  2. Voltaren gel to knees qid, also use ice.  3. Continue with fentanyl patch. She needs to be sure that the patch is  adhering appropriately.  4. Percocet for breakthrough pain. #75 10/325 Was refilled today.  5. Knee exercises in a closed kinetic chain only.   6. Consider surgical evaluation as well especially for the C5-C6 level.  The patient is now complaining about balance problems and falls, also patella reflexes were 3+, but no other cord signs. Discussed with patient referral to neurosurgery, but patient does want to discuss this with Dr. Riley Kill first. 8. Follow up with Dr. Riley Kill

## 2012-03-18 NOTE — Patient Instructions (Signed)
Continue with staying as active as tolerated 

## 2012-03-26 ENCOUNTER — Ambulatory Visit: Payer: Medicaid Other | Admitting: Physical Medicine & Rehabilitation

## 2012-03-29 ENCOUNTER — Ambulatory Visit (HOSPITAL_COMMUNITY): Payer: Medicaid Other

## 2012-04-07 ENCOUNTER — Encounter: Payer: Self-pay | Admitting: Gastroenterology

## 2012-04-08 ENCOUNTER — Other Ambulatory Visit: Payer: Self-pay | Admitting: Physical Medicine & Rehabilitation

## 2012-04-08 ENCOUNTER — Ambulatory Visit (HOSPITAL_COMMUNITY): Admission: RE | Admit: 2012-04-08 | Payer: Medicaid Other | Source: Ambulatory Visit

## 2012-04-09 ENCOUNTER — Other Ambulatory Visit: Payer: Self-pay

## 2012-04-09 DIAGNOSIS — M7530 Calcific tendinitis of unspecified shoulder: Secondary | ICD-10-CM

## 2012-04-09 DIAGNOSIS — IMO0001 Reserved for inherently not codable concepts without codable children: Secondary | ICD-10-CM

## 2012-04-09 MED ORDER — CARISOPRODOL 350 MG PO TABS: 350.0000 mg | ORAL_TABLET | Freq: Three times a day (TID) | ORAL | Status: DC | PRN

## 2012-04-14 ENCOUNTER — Encounter: Payer: Self-pay | Admitting: Endocrinology

## 2012-04-14 ENCOUNTER — Ambulatory Visit (INDEPENDENT_AMBULATORY_CARE_PROVIDER_SITE_OTHER): Payer: Medicaid Other | Admitting: Endocrinology

## 2012-04-14 VITALS — BP 84/50 | HR 68 | Temp 98.2°F | Resp 16 | Ht 63.0 in | Wt 180.0 lb

## 2012-04-14 DIAGNOSIS — F17200 Nicotine dependence, unspecified, uncomplicated: Secondary | ICD-10-CM

## 2012-04-14 DIAGNOSIS — L309 Dermatitis, unspecified: Secondary | ICD-10-CM

## 2012-04-14 DIAGNOSIS — I959 Hypotension, unspecified: Secondary | ICD-10-CM

## 2012-04-14 DIAGNOSIS — E0590 Thyrotoxicosis, unspecified without thyrotoxic crisis or storm: Secondary | ICD-10-CM

## 2012-04-14 DIAGNOSIS — F17210 Nicotine dependence, cigarettes, uncomplicated: Secondary | ICD-10-CM | POA: Insufficient documentation

## 2012-04-14 DIAGNOSIS — L259 Unspecified contact dermatitis, unspecified cause: Secondary | ICD-10-CM

## 2012-04-14 NOTE — Progress Notes (Signed)
Subjective:    Patient ID: Summer Estrada, female    DOB: Jul 29, 1960, 52 y.o.   MRN: [id number]  HPI Pt was noted 10 mos ago to have a suppressed TSH.  She says she is unaware of ever having had a thyroid problem before this.  Symptomatically, pt states few years of severe pain of the joints throughout the body, and assoc myalgias. Past Medical History  Diagnosis Date  . Depression   . Knee pain   . Arthritis     Past Surgical History  Procedure Date  . Total hip arthroplasty   . Cesarean section     History   Social History  . Marital Status: Married    Spouse Name: N/A    Number of Children: N/A  . Years of Education: N/A   Occupational History  . Not on file.   Social History Main Topics  . Smoking status: Current Every Day Smoker  . Smokeless tobacco: Never Used  . Alcohol Use: No  . Drug Use: No  . Sexually Active: Yes    Birth Control/ Protection: Post-menopausal   Other Topics Concern  . Not on file   Social History Narrative   WIDOWCHILDREN; ADULT GIRLS; 3DISABLED    Current Outpatient Prescriptions on File Prior to Visit  Medication Sig Dispense Refill  . aspirin 81 MG tablet Take 81 mg by mouth daily.       Marland Kitchen buPROPion (WELLBUTRIN) 100 MG tablet Take 100 mg by mouth 2 (two) times daily.      . carisoprodol (SOMA) 350 MG tablet Take 1 tablet (350 mg total) by mouth every 8 (eight) hours as needed.  60 tablet  4  . Cyanocobalamin (VITAMIN B 12) 100 MCG LOZG Take 1 lozenge by mouth 2 (two) times daily.      . diclofenac sodium (VOLTAREN) 1 % GEL Apply topically. Apply 2gms QID      . fenofibrate (TRICOR) 145 MG tablet Take 145 mg by mouth daily.      . fentaNYL (DURAGESIC) 25 MCG/HR Place 1 patch (25 mcg total) onto the skin every 3 (three) days.  10 patch  0  . FLUoxetine (PROZAC) 10 MG tablet Take 1 tablet (10 mg total) by mouth 2 (two) times daily.  60 tablet  3  . gabapentin (NEURONTIN) 600 MG tablet Take 600 mg by mouth 3 (three) times daily.       . meloxicam (MOBIC) 15 MG tablet TAKE 1 TABLET BY MOUTH EVERY DAY  30 tablet  2  . multivitamin (THERAGRAN) per tablet Take 1 tablet by mouth daily.      Marland Kitchen oxyCODONE-acetaminophen (PERCOCET) 10-325 MG per tablet Take 1 tablet by mouth every 6 (six) hours as needed for pain.  75 tablet  0  . traZODone (DESYREL) 50 MG tablet Take 1 tablet (50 mg total) by mouth at bedtime.  30 tablet  3  . VITAMIN D, CHOLECALCIFEROL, PO Take by mouth.        Allergies  Allergen Reactions  . Codeine Nausea And Vomiting and Rash    Rash all over body; severe nausea and vomiting.    Family History  Problem Relation Age of Onset  . Kidney disease Mother   . Heart disease Father   no goiter or other thyroid problems  BP 84/50  Pulse 68  Temp 98.2 F (36.8 C) (Oral)  Resp 16  Ht 5\' 3"  (1.6 m)  Wt 180 lb (81.647 kg)  BMI 31.89 kg/m2  SpO2  96%  Review of Systems denies hoarseness, double vision, palpitations, sob, diarrhea, polyuria, excessive diaphoresis, tremor, easy bruising, and rhinorrhea. She reports chronic headache, weight loss, muscle weakness, numbness of the left lateral thigh, menopausal sxs, anxiety, and fatigue.     Objective:   Physical Exam VS: see vs page GEN: no distress HEAD: head: no deformity eyes: no periorbital swelling, no proptosis external nose and ears are normal mouth: no lesion seen NECK: i think i can feel a small goiter, with irregular surface, but i can't be certain.   CHEST WALL: no deformity LUNGS:  Clear to auscultation CV: reg rate and rhythm, no murmur ABD: abdomen is soft, nontender.  no hepatosplenomegaly.  not distended.  no hernia MUSCULOSKELETAL: muscle bulk and strength are grossly normal, except as limited by pain.  no obvious joint swelling.  gait is normal and steady EXTEMITIES: no deformity of the hands.  Trace bilat leg edema PULSES: dorsalis pedis intact bilat.  no carotid bruit NEURO:  cn 2-12 grossly intact.   readily moves all 4's.   sensation is intact to touch on all 4's.  No tremor. SKIN:  Normal texture and temperature.  No rash or suspicious lesion is visible.  Not diaphoretic.   NODES:  None palpable at the neck.   PSYCH: alert, oriented x3.  Does not appear anxious nor depressed.     outside test results are reviewed: 03/25/12 TSH=0.01 FTI=normal    Assessment & Plan:  Hyperthyroidism, uncertain etiology Depression.  This limits interpretation of sxs. Hypotension, uncertain etiology.

## 2012-04-14 NOTE — Patient Instructions (Addendum)
let's check a thyroid "scan" (a special, but easy and painless type of thyroid x ray).  It works like this: you go to the x-ray department of the hospital to swallow a pill, which contains a miniscule amount of radiation.  You will not notice any symptoms from this.  You will go back to the x-ray department the next day, to lie down in front of a camera.  The results of this will be sent to me.   Based on the results, i hope to order for you a treatment pill of radioactive iodine.  Although it is a larger amount of radiation, you will again notice no symptoms from this.  The pill is gone from your body in a few days (during which you should stay away from other people), but takes several months to work.  Therefore, please return here approximately 6-8 weeks after the treatment.  This treatment has been available for many years, and the only known side-effect is an underactive thyroid.  It is possible that i would eventually prescribe for you a thyroid hormone pill, which is very inexpensive.  You don't have to worry about side-effects of this thyroid hormone pill, because it is the same molecule your thyroid makes.     Hyperthyroidism The thyroid is a large gland located in the lower front part of your neck. The thyroid helps control metabolism. Metabolism is how your body uses food. It controls metabolism with the hormone thyroxine. When the thyroid is overactive, it produces too much hormone. When this happens, these following problems may occur:    Nervousness   Heat intolerance   Weight loss (in spite of increase food intake)   Diarrhea   Change in hair or skin texture   Palpitations (heart skipping or having extra beats)   Tachycardia (rapid heart rate)   Loss of menstruation (amenorrhea)   Shaking of the hands  CAUSES  Grave's Disease (the immune system attacks the thyroid gland). This is the most common cause.   Inflammation of the thyroid gland.   Tumor (usually benign) in the  thyroid gland or elsewhere.   Excessive use of thyroid medications (both prescription and 'natural').   Excessive ingestion of Iodine.  DIAGNOSIS   To prove hyperthyroidism, your caregiver may do blood tests and ultrasound tests. Sometimes the signs are hidden. It may be necessary for your caregiver to watch this illness with blood tests, either before or after diagnosis and treatment. TREATMENT Short-term treatment There are several treatments to control symptoms. Drugs called beta blockers may give some relief. Drugs that decrease hormone production will provide temporary relief in many people. These measures will usually not give permanent relief. Definitive therapy There are treatments available which can be discussed between you and your caregiver which will permanently treat the problem. These treatments range from surgery (removal of the thyroid), to the use of radioactive iodine (destroys the thyroid by radiation), to the use of antithyroid drugs (interfere with hormone synthesis). The first two treatments are permanent and usually successful. They most often require hormone replacement therapy for life. This is because it is impossible to remove or destroy the exact amount of thyroid required to make a person euthyroid (normal). HOME CARE INSTRUCTIONS   See your caregiver if the problems you are being treated for get worse. Examples of this would be the problems listed above. SEEK MEDICAL CARE IF: Your general condition worsens. MAKE SURE YOU:    Understand these instructions.   Will watch your condition.  Will get help right away if you are not doing well or get worse.  Document Released: 03/03/2005 Document Revised: 05/26/2011 Document Reviewed: 07/15/2006 [facility] Patient Information 2013 [place], [place].

## 2012-04-16 ENCOUNTER — Encounter: Payer: Medicaid Other | Admitting: Physical Medicine & Rehabilitation

## 2012-04-19 ENCOUNTER — Encounter: Payer: Self-pay | Admitting: Gastroenterology

## 2012-04-20 ENCOUNTER — Encounter
Payer: Medicaid Other | Attending: Physical Medicine and Rehabilitation | Admitting: Physical Medicine and Rehabilitation

## 2012-04-20 ENCOUNTER — Encounter: Payer: Self-pay | Admitting: Physical Medicine and Rehabilitation

## 2012-04-20 VITALS — BP 87/53 | HR 69 | Resp 14 | Ht 63.0 in | Wt 180.0 lb

## 2012-04-20 DIAGNOSIS — M5030 Other cervical disc degeneration, unspecified cervical region: Secondary | ICD-10-CM | POA: Insufficient documentation

## 2012-04-20 DIAGNOSIS — F329 Major depressive disorder, single episode, unspecified: Secondary | ICD-10-CM | POA: Insufficient documentation

## 2012-04-20 DIAGNOSIS — F411 Generalized anxiety disorder: Secondary | ICD-10-CM | POA: Insufficient documentation

## 2012-04-20 DIAGNOSIS — X58XXXA Exposure to other specified factors, initial encounter: Secondary | ICD-10-CM | POA: Insufficient documentation

## 2012-04-20 DIAGNOSIS — M719 Bursopathy, unspecified: Secondary | ICD-10-CM | POA: Insufficient documentation

## 2012-04-20 DIAGNOSIS — S99919A Unspecified injury of unspecified ankle, initial encounter: Secondary | ICD-10-CM | POA: Insufficient documentation

## 2012-04-20 DIAGNOSIS — Z5181 Encounter for therapeutic drug level monitoring: Secondary | ICD-10-CM

## 2012-04-20 DIAGNOSIS — G8929 Other chronic pain: Secondary | ICD-10-CM | POA: Insufficient documentation

## 2012-04-20 DIAGNOSIS — M542 Cervicalgia: Secondary | ICD-10-CM

## 2012-04-20 DIAGNOSIS — M7650 Patellar tendinitis, unspecified knee: Secondary | ICD-10-CM

## 2012-04-20 DIAGNOSIS — F3289 Other specified depressive episodes: Secondary | ICD-10-CM | POA: Insufficient documentation

## 2012-04-20 DIAGNOSIS — R279 Unspecified lack of coordination: Secondary | ICD-10-CM | POA: Insufficient documentation

## 2012-04-20 DIAGNOSIS — M1710 Unilateral primary osteoarthritis, unspecified knee: Secondary | ICD-10-CM | POA: Insufficient documentation

## 2012-04-20 DIAGNOSIS — M67919 Unspecified disorder of synovium and tendon, unspecified shoulder: Secondary | ICD-10-CM | POA: Insufficient documentation

## 2012-04-20 DIAGNOSIS — M25519 Pain in unspecified shoulder: Secondary | ICD-10-CM | POA: Insufficient documentation

## 2012-04-20 DIAGNOSIS — M7530 Calcific tendinitis of unspecified shoulder: Secondary | ICD-10-CM

## 2012-04-20 DIAGNOSIS — M47812 Spondylosis without myelopathy or radiculopathy, cervical region: Secondary | ICD-10-CM

## 2012-04-20 DIAGNOSIS — M170 Bilateral primary osteoarthritis of knee: Secondary | ICD-10-CM

## 2012-04-20 DIAGNOSIS — S8990XA Unspecified injury of unspecified lower leg, initial encounter: Secondary | ICD-10-CM | POA: Insufficient documentation

## 2012-04-20 DIAGNOSIS — IMO0001 Reserved for inherently not codable concepts without codable children: Secondary | ICD-10-CM | POA: Insufficient documentation

## 2012-04-20 MED ORDER — OXYCODONE-ACETAMINOPHEN 10-325 MG PO TABS: 1.0000 | ORAL_TABLET | Freq: Four times a day (QID) | ORAL | Status: DC | PRN

## 2012-04-20 NOTE — Progress Notes (Signed)
Subjective:    Patient ID: Summer Estrada, female    DOB: [birth year], 52 y.o.   MRN: [id number]  HPI Summer Estrada is back regarding her chronic pain. Her knee has been a little better as she has been staying off of it relatively. Her MRI was denied by [facility] due to the fact that we didn't meet certain criteria. Her left shoulder is bothering her still. again and often keeps her up at night.  Right knee tends to lock up at times if she is walking or trying to navigate steps. It has given away when she walks as well. She also reports, that she had some falls, because of bad balance. Today she also complains about intermittend pain in her right thigh on the lateral side. She is mainly here for a med refill, she will see Dr. Riley Kill next week. Pain Inventory Average Pain 8 Pain Right Now 6 My pain is intermittent, sharp, tingling and aching  In the last 24 hours, has pain interfered with the following? General activity 8 Relation with others 8 Enjoyment of life 8 What TIME of day is your pain at its worst? morning, evening and night Sleep (in general) Fair  Pain is worse with: walking, bending, sitting, inactivity and standing Pain improves with: medication Relief from Meds: 8  Mobility use a cane  Function disabled: date disabled   Neuro/Psych No problems in this area  Prior Studies Any changes since last visit?  no  Physicians involved in your care Any changes since last visit?  no   Family History  Problem Relation Age of Onset  . Kidney disease Mother   . Heart disease Father    History   Social History  . Marital Status: Married    Spouse Name: N/A    Number of Children: N/A  . Years of Education: N/A   Social History Main Topics  . Smoking status: Current Every Day Smoker  . Smokeless tobacco: Never Used  . Alcohol Use: No  . Drug Use: No  . Sexually Active: Yes    Birth Control/ Protection: Post-menopausal   Other Topics Concern  . None   Social History  Narrative   WIDOWCHILDREN; ADULT GIRLS; 3DISABLED   Past Surgical History  Procedure Date  . Total hip arthroplasty   . Cesarean section    Past Medical History  Diagnosis Date  . Depression   . Knee pain   . Arthritis    BP 87/53  Pulse 69  Resp 14  Ht 5\' 3"  (1.6 m)  Wt 180 lb (81.647 kg)  BMI 31.89 kg/m2  SpO2 96%     Review of Systems  HENT: Positive for neck pain.   Musculoskeletal: Positive for back pain.  All other systems reviewed and are negative.       Objective:   Physical Exam Constitutional: She is oriented to person, place, and time. She appears well-developed and well-nourished.  Walks with a cane  HENT:  Head: Normocephalic.  Musculoskeletal: She exhibits tenderness.  Neurological: She is alert and oriented to person, place, and time.  Skin: Skin is warm and dry.  Psychiatric: normal mood    Symmetric normal motor tone is noted throughout. Normal muscle bulk. Muscle strength grossly intact. ROM of spine is restricted. Fine motor movements are normal in both hands.  Sensory is intact and symmetric to light touch, pinprick and proprioception.  DTR in the upper and lower extremity are present and symmetric 2-3+. No clonus is noted.  Patient  arises from chair with mild difficulty. Wide based gait with a cane. . Tandem walk is very difficult, only possible with some help. No pronator drift. Rhomberg negative.        Assessment & Plan:  1. Chronic left rotator cuff syndrome with myofascial shoulder girdle pain- i think this is more the source of her pain at present.  2. 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 Balance problems 3. Anxiety and depression.  4. OA/meniscal injury right knee, medial greater than lateral meniscus.  PLAN:  1. MRI of the right knee to assess mensisci was approved, patient no showed, but she states, that she never got a call about this appointment on the 23rd of  Jan.   2. Voltaren gel to knees qid, also use ice.  3. Continue with fentanyl patch. She needs to be sure that the patch is adhering appropriately.  4. Percocet for breakthrough pain. #75 10/325 Was refilled today.  5. Knee exercises in a closed kinetic chain only.  6. Consider surgical evaluation as well especially for the C5-C6 level. The patient is now complaining about balance problems and falls, also patella reflexes were 3+, but no other cord signs. Discussed with patient referral to neurosurgery, but patient does want to discuss this with Dr. Riley Kill first.   Follow up with Dr. Riley Kill

## 2012-04-20 NOTE — Patient Instructions (Signed)
Try to stay as active as pain permits. 

## 2012-04-22 ENCOUNTER — Ambulatory Visit (HOSPITAL_COMMUNITY)
Admission: RE | Admit: 2012-04-22 | Discharge: 2012-04-22 | Disposition: A | Discharge status: Home / Self Care | Payer: Medicaid Other | Source: Ambulatory Visit | Attending: Physical Medicine & Rehabilitation | Admitting: Physical Medicine & Rehabilitation

## 2012-04-22 ENCOUNTER — Telehealth: Payer: Self-pay | Admitting: Physical Medicine & Rehabilitation

## 2012-04-22 DIAGNOSIS — X58XXXA Exposure to other specified factors, initial encounter: Secondary | ICD-10-CM | POA: Insufficient documentation

## 2012-04-22 DIAGNOSIS — M2240 Chondromalacia patellae, unspecified knee: Secondary | ICD-10-CM | POA: Insufficient documentation

## 2012-04-22 DIAGNOSIS — M25569 Pain in unspecified knee: Secondary | ICD-10-CM | POA: Insufficient documentation

## 2012-04-22 DIAGNOSIS — M6750 Plica syndrome, unspecified knee: Secondary | ICD-10-CM | POA: Insufficient documentation

## 2012-04-22 DIAGNOSIS — IMO0002 Reserved for concepts with insufficient information to code with codable children: Secondary | ICD-10-CM | POA: Insufficient documentation

## 2012-04-22 DIAGNOSIS — M170 Bilateral primary osteoarthritis of knee: Secondary | ICD-10-CM

## 2012-04-22 DIAGNOSIS — M25469 Effusion, unspecified knee: Secondary | ICD-10-CM | POA: Insufficient documentation

## 2012-04-22 DIAGNOSIS — M898X9 Other specified disorders of bone, unspecified site: Secondary | ICD-10-CM | POA: Insufficient documentation

## 2012-04-22 NOTE — Telephone Encounter (Signed)
Multiple degenerative findings on her right knee mri. She needs orthopedic referral for assessment and recs. Contact the patient and ask if she has any preferences.   thanks

## 2012-04-23 NOTE — Telephone Encounter (Signed)
Left VM for Summer Estrada on her personal VM to return our call with a name of an orthopedic if she has a preference.

## 2012-04-27 ENCOUNTER — Ambulatory Visit: Payer: Medicaid Other | Admitting: Gastroenterology

## 2012-04-28 ENCOUNTER — Telehealth: Payer: Self-pay

## 2012-04-28 ENCOUNTER — Encounter: Payer: Medicaid Other | Admitting: Physical Medicine & Rehabilitation

## 2012-04-28 MED ORDER — MELOXICAM 15 MG PO TABS: ORAL_TABLET | ORAL | Status: DC

## 2012-04-28 NOTE — Telephone Encounter (Signed)
Imaging findings and referrals will be discussed at next appointment.

## 2012-04-28 NOTE — Telephone Encounter (Signed)
Meloxicam refilled  

## 2012-05-03 ENCOUNTER — Encounter (HOSPITAL_COMMUNITY)
Admission: RE | Admit: 2012-05-03 | Discharge: 2012-05-03 | Disposition: A | Discharge status: Home / Self Care | Payer: Medicaid Other | Source: Ambulatory Visit | Attending: Endocrinology | Admitting: Endocrinology

## 2012-05-03 DIAGNOSIS — E0590 Thyrotoxicosis, unspecified without thyrotoxic crisis or storm: Secondary | ICD-10-CM | POA: Insufficient documentation

## 2012-05-04 ENCOUNTER — Other Ambulatory Visit: Payer: Self-pay | Admitting: Endocrinology

## 2012-05-04 ENCOUNTER — Encounter (HOSPITAL_COMMUNITY)
Admission: RE | Admit: 2012-05-04 | Discharge: 2012-05-04 | Disposition: A | Discharge status: Home / Self Care | Payer: Medicaid Other | Source: Ambulatory Visit | Attending: Endocrinology | Admitting: Endocrinology

## 2012-05-04 DIAGNOSIS — E0590 Thyrotoxicosis, unspecified without thyrotoxic crisis or storm: Secondary | ICD-10-CM

## 2012-05-04 MED ORDER — SODIUM PERTECHNETATE TC 99M INJECTION
11.0000 | Freq: Once | INTRAVENOUS | Status: AC | PRN
  Administered 2012-05-04: 11 via INTRAVENOUS

## 2012-05-04 MED ORDER — SODIUM IODIDE I 131 CAPSULE
8.0000 | Freq: Once | INTRAVENOUS | Status: AC | PRN
  Administered 2012-05-04: 8 via ORAL

## 2012-05-05 ENCOUNTER — Encounter: Payer: Medicaid Other | Admitting: Physical Medicine & Rehabilitation

## 2012-05-06 ENCOUNTER — Other Ambulatory Visit: Payer: Self-pay | Admitting: Physical Medicine & Rehabilitation

## 2012-05-07 ENCOUNTER — Encounter (HOSPITAL_BASED_OUTPATIENT_CLINIC_OR_DEPARTMENT_OTHER): Payer: Medicaid Other | Admitting: Physical Medicine & Rehabilitation

## 2012-05-07 ENCOUNTER — Encounter: Payer: Self-pay | Admitting: Physical Medicine & Rehabilitation

## 2012-05-07 VITALS — BP 82/66 | HR 74 | Resp 14 | Ht 63.0 in | Wt 171.0 lb

## 2012-05-07 DIAGNOSIS — M7530 Calcific tendinitis of unspecified shoulder: Secondary | ICD-10-CM

## 2012-05-07 DIAGNOSIS — M47812 Spondylosis without myelopathy or radiculopathy, cervical region: Secondary | ICD-10-CM

## 2012-05-07 DIAGNOSIS — IMO0001 Reserved for inherently not codable concepts without codable children: Secondary | ICD-10-CM

## 2012-05-07 DIAGNOSIS — M7650 Patellar tendinitis, unspecified knee: Secondary | ICD-10-CM

## 2012-05-07 DIAGNOSIS — IMO0002 Reserved for concepts with insufficient information to code with codable children: Secondary | ICD-10-CM

## 2012-05-07 DIAGNOSIS — M170 Bilateral primary osteoarthritis of knee: Secondary | ICD-10-CM

## 2012-05-07 DIAGNOSIS — S83249A Other tear of medial meniscus, current injury, unspecified knee, initial encounter: Secondary | ICD-10-CM

## 2012-05-07 DIAGNOSIS — M1710 Unilateral primary osteoarthritis, unspecified knee: Secondary | ICD-10-CM

## 2012-05-07 MED ORDER — FENTANYL 25 MCG/HR TD PT72: 1.0000 | MEDICATED_PATCH | TRANSDERMAL | Status: DC

## 2012-05-07 MED ORDER — OXYCODONE-ACETAMINOPHEN 10-325 MG PO TABS: 1.0000 | ORAL_TABLET | Freq: Four times a day (QID) | ORAL | Status: DC | PRN

## 2012-05-07 NOTE — Progress Notes (Signed)
Subjective:    Patient ID: Summer Estrada, female    DOB: 14-Dec-1960, 52 y.o.   MRN: [id number]  HPI  Summer Estrada is back regarding her chronic neck, shoulder, knee pain. She went for her MRI of the right knee which revealed:  1. Radial tear of the posterior horn of the medial meniscus near  the meniscal root, with oblique extension towards the midbody of  the medial meniscus.  2. Laterally tilted patella with a posterior concave lateral facet  which articulates with the convex and dysplastic femoral trochlear  groove. Chondral thinning and irregularity is present along the  lateral facet.  3. Considerable chondromalacia and moderate degenerative chondral  thinning in the medial compartment, with mild chondromalacia in the  lateral compartment.  4. Tri-compartmental spurring, with a moderate knee effusion and  thickened superior plica.  Otherwise her meds are holding her neck and shoulder pain. She uses a cane for balance. She is limited in walking distance due to pain in her righ knee.   Pain Inventory Average Pain 7 Pain Right Now 7 My pain is constant, sharp, burning and aching  In the last 24 hours, has pain interfered with the following? General activity 9 Relation with others 9 Enjoyment of life 9 What TIME of day is your pain at its worst? varies Sleep (in general) Fair  Pain is worse with: some activites Pain improves with: medication Relief from Meds: 7  Mobility use a cane ability to climb steps?  no do you drive?  no Do you have any goals in this area?  yes  Function Do you have any goals in this area?  no  Neuro/Psych trouble walking  Prior Studies Any changes since last visit?  no  Physicians involved in your care Any changes since last visit?  no   Family History  Problem Relation Age of Onset  . Kidney disease Mother   . Heart disease Father    History   Social History  . Marital Status: Married    Spouse Name: N/A    Number of  Children: N/A  . Years of Education: N/A   Social History Main Topics  . Smoking status: Current Every Day Smoker  . Smokeless tobacco: Never Used  . Alcohol Use: No  . Drug Use: No  . Sexually Active: Yes    Birth Control/ Protection: Post-menopausal   Other Topics Concern  . None   Social History Narrative   [employer]   CHILDREN; ADULT GIRLS; 3   DISABLED   Past Surgical History  Procedure Laterality Date  . Total hip arthroplasty    . Cesarean section     Past Medical History  Diagnosis Date  . Depression   . Knee pain   . Arthritis    BP 82/66  Pulse 74  Resp 14  Ht 5\' 3"  (1.6 m)  Wt 171 lb (77.565 kg)  BMI 30.3 kg/m2  SpO2 97%     Review of Systems  HENT: Positive for neck pain.   Musculoskeletal: Positive for back pain.  All other systems reviewed and are negative.       Objective:   Physical Exam Constitutional: She is oriented to person, place, and time. She appears well-developed and well-nourished.  HENT:  Head: Normocephalic and atraumatic.  Eyes: Conjunctivae and EOM are normal. Pupils are equal, round, and reactive to light. No scleral icterus.  Cardiovascular: Normal rate and regular rhythm.  Pulmonary/Chest: Effort normal and breath sounds normal.  Abdominal: Bowel sounds  are normal.  Musculoskeletal: Normal range of motion.  Spurling's test is negative. Rotator cuff impingement maneuvers were positve but improved.. She's slightly tender at the left [facility] joint. Apprehension test was negative. She had pain with palpation over the acromium into the subdeltoid bursa area.     Increased pain in the right knee with AROM, PROM. crepitus appreciated with  movements. The leg did lock momentarily with flexion past 90 degrees. She still has significant joint line tenderness especially along the medial aspect of the knee. Mild swelling noted around and below the patella. McMurray's test was positive medially more than laterally at the right knee. She walks  with significant antalgia on the right. She uses her cane to unload the limb.  Neurological: She is alert and oriented to person, place, and time. She has normal strength and normal reflexes. No cranial nerve deficit or sensory deficit.  Psychiatric: Her speech is normal. Thought content normal. Her mood appears anxious. Cognition and memory are normal. She exhibits improved mood and is more relaxed. She does become slightly emotional at times.   Assessment & Plan:   ASSESSMENT:  1. Chronic left rotator cuff syndrome with myofascial shoulder girdle pain- 2. 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  3. Anxiety and depression.  4. Medial meniscal injury right knee, mild tricompartmental osteoarthritis 5. Right Patellar tendonitis, bursitis.   PLAN:  1.Will make a referral to orthopedic surgery for surgical rx of medial meniscal tear. 2. Voltaren gel to knees qid, also use ice.  3. Continue with fentanyl patch. She needs to be sure that the patch is adhering appropriately. We discussed potential ways to help with this. If it remains a problem, then we need to consider another agent.  4. Percocet for breakthrough pain. #75 10/325 Was refilled today although she can't fill until 3/4 5. Knee exercises in a closed kinetic chain only. Recommended use of a knee sleeve if she walks longer distance. i would limit excessive walking at present until her knee is addressed by surgery. 6. Follow up with my PA in one month. 7. Stil consider neurosurgical evaluation as well especially for the C5-C6 level. However, the patient is not having any neurological symptoms at present and I think the shoulder pain remains primarily RTC.  8. 15 minutes of face to face patient care time were spent during this visit. All questions were encouraged and answered. I will see her back in one month.

## 2012-05-07 NOTE — Patient Instructions (Addendum)
Exercise to tolerance. Wear a knee sleeve when you are up walking any longer distances

## 2012-05-12 ENCOUNTER — Other Ambulatory Visit: Payer: Medicaid Other

## 2012-05-14 ENCOUNTER — Ambulatory Visit: Payer: Medicaid Other | Admitting: Gastroenterology

## 2012-05-19 ENCOUNTER — Other Ambulatory Visit: Payer: Self-pay | Admitting: Endocrinology

## 2012-05-19 ENCOUNTER — Telehealth: Payer: Self-pay | Admitting: Endocrinology

## 2012-05-19 ENCOUNTER — Ambulatory Visit
Admission: RE | Admit: 2012-05-19 | Discharge: 2012-05-19 | Disposition: A | Discharge status: Home / Self Care | Payer: Medicaid Other | Source: Ambulatory Visit | Attending: Endocrinology | Admitting: Endocrinology

## 2012-05-19 DIAGNOSIS — E0590 Thyrotoxicosis, unspecified without thyrotoxic crisis or storm: Secondary | ICD-10-CM

## 2012-05-19 DIAGNOSIS — E042 Nontoxic multinodular goiter: Secondary | ICD-10-CM

## 2012-05-19 NOTE — Telephone Encounter (Signed)
Returning a call. / Sherri CB# [phone]

## 2012-05-20 ENCOUNTER — Telehealth: Payer: Self-pay

## 2012-05-20 ENCOUNTER — Telehealth: Payer: Self-pay | Admitting: Endocrinology

## 2012-05-20 NOTE — Telephone Encounter (Signed)
Tammy with [facility] [phone] called, does this pt need a thyroid biopsy, pt is not aware that she needs a biopsy, Tammy tried to call pt and set this up pt did not know anything about it.

## 2012-05-20 NOTE — Telephone Encounter (Signed)
Pt awaiting return call, wanted to provide alternate number.  Cell number [phone]. Sherri S.

## 2012-05-20 NOTE — Telephone Encounter (Signed)
Pt advised of ultrasound report and states an udnerstanding tht she needs to have biposy done, pt states that she will call  Tammy with [place] reschedule biposy.

## 2012-05-20 NOTE — Telephone Encounter (Signed)
Please see result note of [place].  Pl needs to be called

## 2012-05-20 NOTE — Telephone Encounter (Signed)
i requested that pt be called.

## 2012-05-27 ENCOUNTER — Ambulatory Visit
Admission: RE | Admit: 2012-05-27 | Discharge: 2012-05-27 | Disposition: A | Discharge status: Home / Self Care | Payer: Medicaid Other | Source: Ambulatory Visit | Attending: Endocrinology | Admitting: Endocrinology

## 2012-05-27 ENCOUNTER — Other Ambulatory Visit (HOSPITAL_COMMUNITY)
Admission: RE | Admit: 2012-05-27 | Discharge: 2012-05-27 | Disposition: A | Discharge status: Home / Self Care | Payer: Medicaid Other | Source: Ambulatory Visit | Attending: Interventional Radiology | Admitting: Interventional Radiology

## 2012-05-27 DIAGNOSIS — E041 Nontoxic single thyroid nodule: Secondary | ICD-10-CM | POA: Insufficient documentation

## 2012-05-27 DIAGNOSIS — E042 Nontoxic multinodular goiter: Secondary | ICD-10-CM

## 2012-06-08 ENCOUNTER — Other Ambulatory Visit: Payer: Self-pay

## 2012-06-08 MED ORDER — BUPROPION HCL 100 MG PO TABS: 100.0000 mg | ORAL_TABLET | Freq: Three times a day (TID) | ORAL | Status: DC

## 2012-06-14 ENCOUNTER — Other Ambulatory Visit: Payer: Self-pay | Admitting: Physical Medicine & Rehabilitation

## 2012-06-18 ENCOUNTER — Encounter (HOSPITAL_BASED_OUTPATIENT_CLINIC_OR_DEPARTMENT_OTHER): Admission: RE | Payer: Self-pay | Source: Ambulatory Visit

## 2012-06-18 ENCOUNTER — Ambulatory Visit (HOSPITAL_BASED_OUTPATIENT_CLINIC_OR_DEPARTMENT_OTHER): Admission: RE | Admit: 2012-06-18 | Payer: Medicaid Other | Source: Ambulatory Visit | Admitting: Orthopedic Surgery

## 2012-06-18 SURGERY — ARTHROSCOPY, KNEE, WITH MEDIAL MENISCECTOMY
Anesthesia: Choice | Site: Knee | Laterality: Right

## 2012-06-22 ENCOUNTER — Encounter: Payer: Self-pay | Admitting: Physical Medicine & Rehabilitation

## 2012-06-22 ENCOUNTER — Encounter: Payer: Medicaid Other | Attending: Physical Medicine and Rehabilitation | Admitting: Physical Medicine & Rehabilitation

## 2012-06-22 VITALS — BP 106/35 | HR 77 | Resp 16 | Ht 63.0 in | Wt 162.2 lb

## 2012-06-22 DIAGNOSIS — M76899 Other specified enthesopathies of unspecified lower limb, excluding foot: Secondary | ICD-10-CM

## 2012-06-22 DIAGNOSIS — M7650 Patellar tendinitis, unspecified knee: Secondary | ICD-10-CM

## 2012-06-22 DIAGNOSIS — M47812 Spondylosis without myelopathy or radiculopathy, cervical region: Secondary | ICD-10-CM

## 2012-06-22 DIAGNOSIS — F32A Depression, unspecified: Secondary | ICD-10-CM

## 2012-06-22 DIAGNOSIS — M7061 Trochanteric bursitis, right hip: Secondary | ICD-10-CM

## 2012-06-22 DIAGNOSIS — M4802 Spinal stenosis, cervical region: Secondary | ICD-10-CM

## 2012-06-22 DIAGNOSIS — F341 Dysthymic disorder: Secondary | ICD-10-CM

## 2012-06-22 DIAGNOSIS — S83241D Other tear of medial meniscus, current injury, right knee, subsequent encounter: Secondary | ICD-10-CM

## 2012-06-22 DIAGNOSIS — IMO0001 Reserved for inherently not codable concepts without codable children: Secondary | ICD-10-CM

## 2012-06-22 DIAGNOSIS — F329 Major depressive disorder, single episode, unspecified: Secondary | ICD-10-CM

## 2012-06-22 DIAGNOSIS — M7530 Calcific tendinitis of unspecified shoulder: Secondary | ICD-10-CM

## 2012-06-22 DIAGNOSIS — M170 Bilateral primary osteoarthritis of knee: Secondary | ICD-10-CM

## 2012-06-22 DIAGNOSIS — IMO0002 Reserved for concepts with insufficient information to code with codable children: Secondary | ICD-10-CM | POA: Insufficient documentation

## 2012-06-22 DIAGNOSIS — F419 Anxiety disorder, unspecified: Secondary | ICD-10-CM

## 2012-06-22 DIAGNOSIS — M7531 Calcific tendinitis of right shoulder: Secondary | ICD-10-CM

## 2012-06-22 DIAGNOSIS — M1710 Unilateral primary osteoarthritis, unspecified knee: Secondary | ICD-10-CM

## 2012-06-22 DIAGNOSIS — Z5189 Encounter for other specified aftercare: Secondary | ICD-10-CM

## 2012-06-22 MED ORDER — FENTANYL 25 MCG/HR TD PT72: 1.0000 | MEDICATED_PATCH | TRANSDERMAL | Status: DC

## 2012-06-22 MED ORDER — OXYCODONE-ACETAMINOPHEN 10-325 MG PO TABS: 1.0000 | ORAL_TABLET | Freq: Four times a day (QID) | ORAL | Status: DC | PRN

## 2012-06-22 NOTE — Patient Instructions (Signed)
USE YOUR WALKER IF YOU NEED TO, TO BALANCE YOURSELF WHILE WALKING

## 2012-06-22 NOTE — Progress Notes (Signed)
Subjective:    Patient ID: Summer Estrada, female    DOB: 1960/11/16, 52 y.o.   MRN: [id number]  HPI  Summer Estrada is back regarding her pain complaints. She is complaining of more hip pain over the last several weeks. Her knee surgery has been postponed due to the fact that her daughter just had surgery and she will be her primary caregiver.   Her knee pain has been generally stable.  She lost her fentanyl rx last month, but she was afraid to call our office to let us know, so she took more of the percocet instead.  Pain Inventory Average Pain 8 Pain Right Now 9 My pain is constant  In the last 24 hours, has pain interfered with the following? General activity 8 Relation with others 8 Enjoyment of life 8 What TIME of day is your pain at its worst? n/a Sleep (in general) n/a  Pain is worse with: n/a Pain improves with: n/a Relief from Meds: n/a  Mobility use a cane do you drive?  no Do you have any goals in this area?  no  Function Do you have any goals in this area?  no  Neuro/Psych No problems in this area  Prior Studies Any changes since last visit?  no  Physicians involved in your care Any changes since last visit?  no   Family History  Problem Relation Age of Onset  . Kidney disease Mother   . Heart disease Father    History   Social History  . Marital Status: Married    Spouse Name: N/A    Number of Children: N/A  . Years of Education: N/A   Social History Main Topics  . Smoking status: Current Every Day Smoker  . Smokeless tobacco: Never Used  . Alcohol Use: No  . Drug Use: No  . Sexually Active: Yes    Birth Control/ Protection: Post-menopausal   Other Topics Concern  . None   Social History Narrative   [employer]   CHILDREN; ADULT GIRLS; 3   DISABLED   Past Surgical History  Procedure Laterality Date  . Total hip arthroplasty    . Cesarean section     Past Medical History  Diagnosis Date  . Depression   . Knee pain   . Arthritis     BP 106/35  Pulse 77  Resp 16  Ht 5\' 3"  (1.6 m)  Wt 162 lb 3.2 oz (73.573 kg)  BMI 28.74 kg/m2  SpO2 95%     Review of Systems  All other systems reviewed and are negative.       Objective:   Physical Exam  Constitutional: She is oriented to person, place, and time. She appears well-developed and well-nourished. She has lost noticeable weight. HENT:  Head: Normocephalic and atraumatic.  Eyes: Conjunctivae and EOM are normal. Pupils are equal, round, and reactive to light. No scleral icterus.  Cardiovascular: Normal rate and regular rhythm.  Pulmonary/Chest: Effort normal and breath sounds normal.  Abdominal: Bowel sounds are normal.  Musculoskeletal: Normal range of motion.  Spurling's test is negative. Rotator cuff impingement maneuvers were positve but improved.. She's slightly tender at the left [facility] joint. Apprehension test was negative. She had pain with palpation over the acromium into the subdeltoid bursa area.   Increased pain in the right knee with AROM, PROM. crepitus appreciated with movements. . She still has significant joint line tenderness especially along the medial aspect of the knee. Mild swelling noted around and below the patella.  McMurray's test was positive medially more than laterally at the right knee. She walks with significant antalgia on the right. She has a swaying gait and tries avoid stress upon the right hip. The right greater troch is tender to palpation. Pain with cross legged maneuver as well. Neurological: She is alert and oriented to person, place, and time. She has normal strength and normal reflexes. No cranial nerve deficit or sensory deficit.  Psychiatric: Her speech is normal. Thought content normal. Her mood appears anxious. Cognition and memory are normal. She exhibits improved mood and is more relaxed. She does become slightly emotional at times.    Assessment & Plan:   ASSESSMENT:  1. Chronic left rotator cuff syndrome with myofascial  shoulder girdle pain-  2. 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  3. Anxiety and depression.  4. Medial meniscal injury right knee, mild tricompartmental osteoarthritis  5. Right Patellar tendonitis, bursitis.   PLAN:  1.After informed consent and preparation of the skin with betadine, I injected 40mg  kenalog and 3cc of 1% lidocaine around the right greater trochanter. . The patient tolerated well, and no complications were encountered. Afterward the area was cleaned and dressed. Post- injection instructions were provided. Needs to work on better gait habits, perhaps using a walker in the short term for better support.   2. Voltaren gel to knees qid, also use ice.  3. Continue with fentanyl patch. I told her that if she loses her rx in the future that she needs to let us know rather than try to make adjustments on her own.  4. Percocet for breakthrough pain. #75 10/325 Was refilled today although she can't fill until 3/4  5. Knee exercises in a closed kinetic chain only. Potential surgery by ortho in May.  6. Follow up with my PA in one month.  7. Continue to consider neurosurgical evaluation at some point, especially for the C5-C6 level. However, the patient is not having any neurological symptoms at present and I think the shoulder pain remains primarily RTC.  8. 15 minutes of face to face patient care time were spent during this visit. All questions were encouraged and answered.

## 2012-07-11 ENCOUNTER — Other Ambulatory Visit: Payer: Self-pay | Admitting: Physical Medicine & Rehabilitation

## 2012-07-19 ENCOUNTER — Encounter
Payer: Medicaid Other | Attending: Physical Medicine and Rehabilitation | Admitting: Physical Medicine and Rehabilitation

## 2012-07-19 ENCOUNTER — Encounter: Payer: Self-pay | Admitting: Physical Medicine and Rehabilitation

## 2012-07-19 VITALS — BP 121/82 | HR 65 | Resp 15 | Ht 63.0 in | Wt 159.0 lb

## 2012-07-19 DIAGNOSIS — M47812 Spondylosis without myelopathy or radiculopathy, cervical region: Secondary | ICD-10-CM | POA: Insufficient documentation

## 2012-07-19 DIAGNOSIS — F3289 Other specified depressive episodes: Secondary | ICD-10-CM | POA: Insufficient documentation

## 2012-07-19 DIAGNOSIS — M1710 Unilateral primary osteoarthritis, unspecified knee: Secondary | ICD-10-CM | POA: Insufficient documentation

## 2012-07-19 DIAGNOSIS — R279 Unspecified lack of coordination: Secondary | ICD-10-CM | POA: Insufficient documentation

## 2012-07-19 DIAGNOSIS — X58XXXA Exposure to other specified factors, initial encounter: Secondary | ICD-10-CM | POA: Insufficient documentation

## 2012-07-19 DIAGNOSIS — F411 Generalized anxiety disorder: Secondary | ICD-10-CM | POA: Insufficient documentation

## 2012-07-19 DIAGNOSIS — S83241S Other tear of medial meniscus, current injury, right knee, sequela: Secondary | ICD-10-CM

## 2012-07-19 DIAGNOSIS — M7650 Patellar tendinitis, unspecified knee: Secondary | ICD-10-CM

## 2012-07-19 DIAGNOSIS — IMO0001 Reserved for inherently not codable concepts without codable children: Secondary | ICD-10-CM

## 2012-07-19 DIAGNOSIS — IMO0002 Reserved for concepts with insufficient information to code with codable children: Secondary | ICD-10-CM | POA: Insufficient documentation

## 2012-07-19 DIAGNOSIS — F329 Major depressive disorder, single episode, unspecified: Secondary | ICD-10-CM | POA: Insufficient documentation

## 2012-07-19 DIAGNOSIS — M170 Bilateral primary osteoarthritis of knee: Secondary | ICD-10-CM

## 2012-07-19 DIAGNOSIS — M5030 Other cervical disc degeneration, unspecified cervical region: Secondary | ICD-10-CM | POA: Insufficient documentation

## 2012-07-19 DIAGNOSIS — M7530 Calcific tendinitis of unspecified shoulder: Secondary | ICD-10-CM

## 2012-07-19 DIAGNOSIS — M67919 Unspecified disorder of synovium and tendon, unspecified shoulder: Secondary | ICD-10-CM | POA: Insufficient documentation

## 2012-07-19 DIAGNOSIS — M7531 Calcific tendinitis of right shoulder: Secondary | ICD-10-CM

## 2012-07-19 DIAGNOSIS — M719 Bursopathy, unspecified: Secondary | ICD-10-CM | POA: Insufficient documentation

## 2012-07-19 MED ORDER — FENTANYL 25 MCG/HR TD PT72: 1.0000 | MEDICATED_PATCH | TRANSDERMAL | Status: DC

## 2012-07-19 MED ORDER — OXYCODONE-ACETAMINOPHEN 10-325 MG PO TABS: 1.0000 | ORAL_TABLET | Freq: Four times a day (QID) | ORAL | Status: DC | PRN

## 2012-07-19 MED ORDER — DICLOFENAC SODIUM 1 % TD GEL: 2.0000 g | Freq: Four times a day (QID) | TRANSDERMAL | Status: DC

## 2012-07-19 NOTE — Patient Instructions (Signed)
Stay as active as tolerated. 

## 2012-07-19 NOTE — Progress Notes (Signed)
Subjective:    Patient ID: Summer Estrada, female    DOB: 1960-12-31, 52 y.o.   MRN: [id number]  HPI Summer Estrada is back regarding her pain complaints. She is complaining of more hip pain over the last several weeks. Her knee surgery has been postponed due to the fact that her daughter just had surgery and she will be her primary caregiver. She will have her meniscus surgery on the right knee on June 6th. Her knee pain has been generally stable.  Pain Inventory Average Pain 10 Pain Right Now 8 My pain is intermittent  In the last 24 hours, has pain interfered with the following? General activity 10 Relation with others 10 Enjoyment of life 10 What TIME of day is your pain at its worst? all Sleep (in general) Poor  Pain is worse with: walking, bending and sitting Pain improves with: medication Relief from Meds: 6  Mobility use a cane use a walker ability to climb steps?  no do you drive?  no  Function Do you have any goals in this area?  no  Neuro/Psych No problems in this area  Prior Studies Any changes since last visit?  no  Physicians involved in your care Any changes since last visit?  no   Family History  Problem Relation Age of Onset  . Kidney disease Mother   . Heart disease Father    History   Social History  . Marital Status: Married    Spouse Name: N/A    Number of Children: N/A  . Years of Education: N/A   Social History Main Topics  . Smoking status: Current Every Day Smoker  . Smokeless tobacco: Never Used  . Alcohol Use: No  . Drug Use: No  . Sexually Active: Yes    Birth Control/ Protection: Post-menopausal   Other Topics Concern  . None   Social History Narrative   [employer]   CHILDREN; ADULT GIRLS; 3   DISABLED   Past Surgical History  Procedure Laterality Date  . Total hip arthroplasty    . Cesarean section     Past Medical History  Diagnosis Date  . Depression   . Knee pain   . Arthritis    BP 121/82  Pulse 65  Resp 15   Ht 5\' 3"  (1.6 m)  Wt 159 lb (72.122 kg)  BMI 28.17 kg/m2  SpO2 96%     Review of Systems  All other systems reviewed and are negative.       Objective:   Physical Exam Constitutional: She is oriented to person, place, and time. She appears well-developed and well-nourished.  Walks with a cane  HENT:  Head: Normocephalic.  Musculoskeletal: She exhibits tenderness, medial aspect of right knee.  Neurological: She is alert and oriented to person, place, and time.  Skin: Skin is warm and dry.  Psychiatric: normal mood   Symmetric normal motor tone is noted throughout. Normal muscle bulk. Muscle strength grossly intact. ROM of spine is restricted. Fine motor movements are normal in both hands.   DTR in the upper and lower extremity are present and symmetric 2-3+. No clonus is noted.  Patient arises from chair with mild difficulty. Wide based gait with a cane. . Tandem walk is very difficult, only possible with some help. No pronator drift. Rhomberg negative.        Assessment & Plan:  1. Chronic left rotator cuff syndrome with myofascial shoulder girdle pain-   2. Cervical DDD and spondylosis- C5-6 disk is  most involved. Balance problems  3. Anxiety and depression.  4. OA/meniscal injury right knee, medial meniscus tear.  PLAN:  1. Medial meniscus tear,scheduled surgery on June 6th 2. Voltaren gel to knees qid, also use ice. Refilled the Voltaren gel 3. Continue with fentanyl patch. She needs to be sure that the patch is adhering appropriately.  4. Percocet for breakthrough pain. #75 10/325 Was refilled today.  5. Knee exercises in a closed kinetic chain only.  6. Consider surgical evaluation as well especially for the C5-C6 level. The patient is now complaining about balance problems and falls, also patella reflexes were 3+, but no other cord signs. Consider referral to neurosurgery, but patient does want to discuss this with Dr. Riley Kill first. Patient did not bring her  medication bottles today, educated her about that she has to bring them every time, this is also stated in her contract which she signed. If she does not bring them next time, we will not give her , her new prescriptions until she brings in the meds. The patient understood.  Follow up with Dr. Riley Kill

## 2012-07-30 ENCOUNTER — Other Ambulatory Visit: Payer: Self-pay | Admitting: Orthopedic Surgery

## 2012-08-16 ENCOUNTER — Encounter
Payer: Medicaid Other | Attending: Physical Medicine and Rehabilitation | Admitting: Physical Medicine and Rehabilitation

## 2012-08-16 ENCOUNTER — Encounter: Payer: Self-pay | Admitting: Physical Medicine and Rehabilitation

## 2012-08-16 VITALS — BP 110/60 | HR 71 | Resp 14 | Ht 63.0 in | Wt 157.0 lb

## 2012-08-16 DIAGNOSIS — M170 Bilateral primary osteoarthritis of knee: Secondary | ICD-10-CM

## 2012-08-16 DIAGNOSIS — M7530 Calcific tendinitis of unspecified shoulder: Secondary | ICD-10-CM

## 2012-08-16 DIAGNOSIS — S83242S Other tear of medial meniscus, current injury, left knee, sequela: Secondary | ICD-10-CM

## 2012-08-16 DIAGNOSIS — IMO0002 Reserved for concepts with insufficient information to code with codable children: Secondary | ICD-10-CM

## 2012-08-16 DIAGNOSIS — M719 Bursopathy, unspecified: Secondary | ICD-10-CM | POA: Insufficient documentation

## 2012-08-16 DIAGNOSIS — IMO0001 Reserved for inherently not codable concepts without codable children: Secondary | ICD-10-CM

## 2012-08-16 DIAGNOSIS — F411 Generalized anxiety disorder: Secondary | ICD-10-CM | POA: Insufficient documentation

## 2012-08-16 DIAGNOSIS — M47812 Spondylosis without myelopathy or radiculopathy, cervical region: Secondary | ICD-10-CM

## 2012-08-16 DIAGNOSIS — R269 Unspecified abnormalities of gait and mobility: Secondary | ICD-10-CM | POA: Insufficient documentation

## 2012-08-16 DIAGNOSIS — M25559 Pain in unspecified hip: Secondary | ICD-10-CM | POA: Insufficient documentation

## 2012-08-16 DIAGNOSIS — M5030 Other cervical disc degeneration, unspecified cervical region: Secondary | ICD-10-CM | POA: Insufficient documentation

## 2012-08-16 DIAGNOSIS — M7650 Patellar tendinitis, unspecified knee: Secondary | ICD-10-CM

## 2012-08-16 DIAGNOSIS — F329 Major depressive disorder, single episode, unspecified: Secondary | ICD-10-CM | POA: Insufficient documentation

## 2012-08-16 DIAGNOSIS — M67919 Unspecified disorder of synovium and tendon, unspecified shoulder: Secondary | ICD-10-CM | POA: Insufficient documentation

## 2012-08-16 DIAGNOSIS — F3289 Other specified depressive episodes: Secondary | ICD-10-CM | POA: Insufficient documentation

## 2012-08-16 DIAGNOSIS — M23305 Other meniscus derangements, unspecified medial meniscus, unspecified knee: Secondary | ICD-10-CM | POA: Insufficient documentation

## 2012-08-16 DIAGNOSIS — M7531 Calcific tendinitis of right shoulder: Secondary | ICD-10-CM

## 2012-08-16 DIAGNOSIS — R279 Unspecified lack of coordination: Secondary | ICD-10-CM | POA: Insufficient documentation

## 2012-08-16 DIAGNOSIS — M1710 Unilateral primary osteoarthritis, unspecified knee: Secondary | ICD-10-CM | POA: Insufficient documentation

## 2012-08-16 MED ORDER — OXYCODONE-ACETAMINOPHEN 10-325 MG PO TABS: 1.0000 | ORAL_TABLET | Freq: Four times a day (QID) | ORAL | Status: DC | PRN

## 2012-08-16 NOTE — Patient Instructions (Signed)
Stay as active as pain permits 

## 2012-08-16 NOTE — Progress Notes (Signed)
Subjective:    Patient ID: Summer Estrada, female    DOB: 1961-01-02, 52 y.o.   MRN: [id number]  HPI Orlene is back regarding her pain complaints. She is complaining of more hip pain over the last several weeks, because of her antalgic gait. Her knee surgery has been postponed due to the fact that her daughter just had surgery and she will be her primary caregiver. She will have her meniscus surgery on the right knee on June 6th.  Pain Inventory Average Pain 7 Pain Right Now 7 My pain is constant  In the last 24 hours, has pain interfered with the following? General activity 10 Relation with others 10 Enjoyment of life 10 What TIME of day is your pain at its worst? constant Sleep (in general) Fair  Pain is worse with: some activites Pain improves with: pacing activities Relief from Meds: 9  Mobility walk with assistance use a cane use a walker do you drive?  no Do you have any goals in this area?  yes  Function disabled: date disabled .  Neuro/Psych No problems in this area  Prior Studies Any changes since last visit?  no  Physicians involved in your care Any changes since last visit?  no   Family History  Problem Relation Age of Onset  . Kidney disease Mother   . Heart disease Father    History   Social History  . Marital Status: Married    Spouse Name: N/A    Number of Children: N/A  . Years of Education: N/A   Social History Main Topics  . Smoking status: Current Every Day Smoker  . Smokeless tobacco: Never Used  . Alcohol Use: No  . Drug Use: No  . Sexually Active: Yes    Birth Control/ Protection: Post-menopausal   Other Topics Concern  . None   Social History Narrative   [employer]   CHILDREN; ADULT GIRLS; 3   DISABLED   Past Surgical History  Procedure Laterality Date  . Total hip arthroplasty    . Cesarean section     Past Medical History  Diagnosis Date  . Depression   . Knee pain   . Arthritis    BP 110/60  Pulse 71  Resp  14  Ht 5\' 3"  (1.6 m)  Wt 157 lb (71.215 kg)  BMI 27.82 kg/m2  SpO2 98%     Review of Systems  HENT: Positive for neck pain.   Musculoskeletal: Positive for gait problem.  All other systems reviewed and are negative.       Objective:   Physical Exam Constitutional: She is oriented to person, place, and time. She appears well-developed and well-nourished.  Walks with a cane  HENT:  Head: Normocephalic.  Musculoskeletal: She exhibits tenderness, medial aspect of right knee.  Neurological: She is alert and oriented to person, place, and time.  Skin: Skin is warm and dry.  Psychiatric: normal mood   Symmetric normal motor tone is noted throughout. Normal muscle bulk. Muscle strength grossly intact. ROM of spine is restricted. Fine motor movements are normal in both hands.  DTR in the upper and lower extremity are present and symmetric 2-3+. No clonus is noted.  Patient arises from chair with mild difficulty. Wide based gait with a cane. . Tandem walk is very difficult, only possible with some help. No pronator drift. Rhomberg negative.        Assessment & Plan:  1. Chronic left rotator cuff syndrome with myofascial shoulder girdle pain-  2. Cervical DDD and spondylosis- C5-6 disk is most involved. Balance problems  3. Anxiety and depression.  4. OA/meniscal injury right knee, medial meniscus tear.  PLAN:  1. Medial meniscus tear,scheduled surgery on June 6th  2. Voltaren gel to knees qid, also use ice. Refilled the Voltaren gel  3. Continue with fentanyl patch. She needs to be sure that the patch is adhering appropriately.  4. Percocet for breakthrough pain. #75 10/325 Was refilled today.  5. Knee exercises in a closed kinetic chain only.  6. Consider surgical evaluation as well especially for the C5-C6 level. The patient is now complaining about balance problems and falls, also patella reflexes were 3+, but no other cord signs. Consider referral to neurosurgery.     Follow up with Dr. Riley Kill

## 2012-08-18 ENCOUNTER — Other Ambulatory Visit: Payer: Self-pay | Admitting: Physical Medicine & Rehabilitation

## 2012-08-18 ENCOUNTER — Encounter (HOSPITAL_BASED_OUTPATIENT_CLINIC_OR_DEPARTMENT_OTHER): Payer: Self-pay | Admitting: *Deleted

## 2012-08-18 NOTE — Progress Notes (Signed)
Pt smoker-denies any resp problems-has some congestion sinues-suggested otc mesds-she could not afford them but can buy 1 1/2 ppd cigarettes. No labs needed-told her is chest congested and fever she may not be able to have surgery,

## 2012-08-19 ENCOUNTER — Telehealth: Payer: Self-pay

## 2012-08-19 MED ORDER — TRAZODONE HCL 50 MG PO TABS: ORAL_TABLET | ORAL | Status: DC

## 2012-08-19 NOTE — Telephone Encounter (Signed)
Patient called requesting trazedone refill.  Patient aware this has been sent to [facility].

## 2012-08-20 ENCOUNTER — Ambulatory Visit (HOSPITAL_BASED_OUTPATIENT_CLINIC_OR_DEPARTMENT_OTHER): Admission: RE | Admit: 2012-08-20 | Payer: Medicaid Other | Source: Ambulatory Visit | Admitting: Orthopedic Surgery

## 2012-08-20 HISTORY — DX: Presence of dental prosthetic device (complete) (partial): Z97.2

## 2012-08-20 HISTORY — DX: Gastro-esophageal reflux disease without esophagitis: K21.9

## 2012-08-20 HISTORY — DX: Other chronic pain: G89.29

## 2012-08-20 HISTORY — DX: Presence of spectacles and contact lenses: Z97.3

## 2012-08-20 SURGERY — ARTHROSCOPY, KNEE, WITH MEDIAL MENISCECTOMY
Anesthesia: Choice | Site: Knee | Laterality: Right

## 2012-08-25 ENCOUNTER — Other Ambulatory Visit: Payer: Self-pay | Admitting: *Deleted

## 2012-08-25 MED ORDER — GABAPENTIN 600 MG PO TABS: 600.0000 mg | ORAL_TABLET | Freq: Three times a day (TID) | ORAL | Status: DC

## 2012-08-25 NOTE — Telephone Encounter (Signed)
LAST RF 06/06/12. LAST OV 1/14 WITH DR Modesto Charon.

## 2012-08-30 ENCOUNTER — Telehealth: Payer: Self-pay | Admitting: Family Medicine

## 2012-08-30 NOTE — Telephone Encounter (Signed)
done

## 2012-09-01 ENCOUNTER — Other Ambulatory Visit: Payer: Self-pay | Admitting: Physical Medicine & Rehabilitation

## 2012-09-01 ENCOUNTER — Telehealth: Payer: Self-pay

## 2012-09-01 MED ORDER — GABAPENTIN 300 MG PO CAPS: ORAL_CAPSULE | ORAL | Status: DC

## 2012-09-01 NOTE — Telephone Encounter (Signed)
[facility] notified rx filled for gabapentin 300mg  2 caps tid verbally per dr Modesto Charon

## 2012-09-03 NOTE — Telephone Encounter (Signed)
agreed

## 2012-09-08 ENCOUNTER — Other Ambulatory Visit: Payer: Self-pay | Admitting: *Deleted

## 2012-09-08 MED ORDER — FENOFIBRATE 145 MG PO TABS: 145.0000 mg | ORAL_TABLET | Freq: Every day | ORAL | Status: DC

## 2012-09-14 ENCOUNTER — Encounter
Payer: Medicaid Other | Attending: Physical Medicine and Rehabilitation | Admitting: Physical Medicine and Rehabilitation

## 2012-09-14 ENCOUNTER — Encounter: Payer: Self-pay | Admitting: Physical Medicine and Rehabilitation

## 2012-09-14 VITALS — BP 123/78 | HR 83 | Resp 14 | Ht 63.0 in | Wt 157.0 lb

## 2012-09-14 DIAGNOSIS — IMO0001 Reserved for inherently not codable concepts without codable children: Secondary | ICD-10-CM

## 2012-09-14 DIAGNOSIS — M7530 Calcific tendinitis of unspecified shoulder: Secondary | ICD-10-CM

## 2012-09-14 DIAGNOSIS — M1710 Unilateral primary osteoarthritis, unspecified knee: Secondary | ICD-10-CM

## 2012-09-14 DIAGNOSIS — M47812 Spondylosis without myelopathy or radiculopathy, cervical region: Secondary | ICD-10-CM | POA: Insufficient documentation

## 2012-09-14 DIAGNOSIS — M67919 Unspecified disorder of synovium and tendon, unspecified shoulder: Secondary | ICD-10-CM | POA: Insufficient documentation

## 2012-09-14 DIAGNOSIS — M719 Bursopathy, unspecified: Secondary | ICD-10-CM | POA: Insufficient documentation

## 2012-09-14 DIAGNOSIS — F3289 Other specified depressive episodes: Secondary | ICD-10-CM | POA: Insufficient documentation

## 2012-09-14 DIAGNOSIS — M542 Cervicalgia: Secondary | ICD-10-CM | POA: Insufficient documentation

## 2012-09-14 DIAGNOSIS — M5030 Other cervical disc degeneration, unspecified cervical region: Secondary | ICD-10-CM | POA: Insufficient documentation

## 2012-09-14 DIAGNOSIS — R269 Unspecified abnormalities of gait and mobility: Secondary | ICD-10-CM | POA: Insufficient documentation

## 2012-09-14 DIAGNOSIS — R279 Unspecified lack of coordination: Secondary | ICD-10-CM | POA: Insufficient documentation

## 2012-09-14 DIAGNOSIS — F329 Major depressive disorder, single episode, unspecified: Secondary | ICD-10-CM | POA: Insufficient documentation

## 2012-09-14 DIAGNOSIS — F411 Generalized anxiety disorder: Secondary | ICD-10-CM | POA: Insufficient documentation

## 2012-09-14 DIAGNOSIS — M7531 Calcific tendinitis of right shoulder: Secondary | ICD-10-CM

## 2012-09-14 DIAGNOSIS — M7650 Patellar tendinitis, unspecified knee: Secondary | ICD-10-CM

## 2012-09-14 DIAGNOSIS — M62838 Other muscle spasm: Secondary | ICD-10-CM | POA: Insufficient documentation

## 2012-09-14 DIAGNOSIS — M170 Bilateral primary osteoarthritis of knee: Secondary | ICD-10-CM

## 2012-09-14 MED ORDER — OXYCODONE-ACETAMINOPHEN 10-325 MG PO TABS: 1.0000 | ORAL_TABLET | Freq: Four times a day (QID) | ORAL | Status: DC | PRN

## 2012-09-14 MED ORDER — FENTANYL 25 MCG/HR TD PT72: 1.0000 | MEDICATED_PATCH | TRANSDERMAL | Status: DC

## 2012-09-14 NOTE — Progress Notes (Signed)
Subjective:    Patient ID: Summer Estrada, female    DOB: 01-Jan-1961, 52 y.o.   MRN: [id number]  HPI Irie is back regarding her pain complaints. She is complaining of more hip pain over the last several weeks, because of her antalgic gait. Her knee surgery has been postponed due to the fact that her daughter just had surgery and she will be her primary caregiver. She postponed  her meniscus surgery on the right knee, because of personal/financial issues.  Pain Inventory Average Pain 8 Pain Right Now 4 My pain is constant  In the last 24 hours, has pain interfered with the following? General activity 10 Relation with others 10 Enjoyment of life 10 What TIME of day is your pain at its worst? constant Sleep (in general) Fair  Pain is worse with: some activites Pain improves with: medication Relief from Meds: 5  Mobility use a cane  Function disabled: date disabled .  Neuro/Psych trouble walking depression  Prior Studies Any changes since last visit?  no  Physicians involved in your care Any changes since last visit?  no   Family History  Problem Relation Age of Onset  . Kidney disease Mother   . Heart disease Father    History   Social History  . Marital Status: Married    Spouse Name: N/A    Number of Children: N/A  . Years of Education: N/A   Social History Main Topics  . Smoking status: Current Every Day Smoker -- 1.50 packs/day  . Smokeless tobacco: Never Used  . Alcohol Use: No  . Drug Use: No  . Sexually Active: Yes    Birth Control/ Protection: Post-menopausal   Other Topics Concern  . None   Social History Narrative   WIDOW   CHILDREN; ADULT GIRLS; 3   DISABLED   Past Surgical History  Procedure Laterality Date  . Total hip arthroplasty  2010    lt hip  . Cesarean section      x2  . Tubal ligation     Past Medical History  Diagnosis Date  . Depression   . Knee pain   . Arthritis   . Chronic pain   . GERD (gastroesophageal  reflux disease)     occ  . Wears glasses   . Wears dentures     upper   BP 123/78  Pulse 83  Resp 14  Ht 5\' 3"  (1.6 m)  Wt 157 lb (71.215 kg)  BMI 27.82 kg/m2  SpO2 97%     Review of Systems  Musculoskeletal: Positive for gait problem.  All other systems reviewed and are negative.       Objective:   Physical Exam Constitutional: She is oriented to person, place, and time. She appears well-developed and well-nourished.  Walks with a cane  HENT:  Head: Normocephalic.  Musculoskeletal: She exhibits tenderness, medial aspect of right knee.  Neurological: She is alert and oriented to person, place, and time.  Skin: Skin is warm and dry.  Psychiatric: normal mood   Symmetric normal motor tone is noted throughout. Normal muscle bulk. Muscle strength grossly intact. ROM of spine is restricted. Fine motor movements are normal in both hands.  DTR in the upper and lower extremity are present and symmetric 2-3+. No clonus is noted.  Patient arises from chair with mild difficulty. Wide based gait with a cane. . Tandem walk is very difficult, only possible with some help. No pronator drift. Rhomberg negative.  Assessment & Plan:  1. Chronic left rotator cuff syndrome with myofascial shoulder girdle pain-  2. Cervical DDD and spondylosis- C5-6 disk is most involved. Balance problems  3. Anxiety and depression.  4. OA/meniscal injury right knee, medial meniscus tear.  5. Neck pain with muscle tension/spasms PLAN:  1. Medial meniscus tear,scheduled surgery on June 6th , she postponed this because of financial problems, and the threat of loosing her home 2. Voltaren gel to knees qid, also use ice. Refilled the Voltaren gel  3. Continue with fentanyl patch. She needs to be sure that the patch is adhering appropriately.  4. Percocet for breakthrough pain. #75 10/325 Was refilled today.  5. Knee exercises in a closed kinetic chain only.  6. Consider surgical evaluation  as well especially for the C5-C6 level. The patient had complained about balance problems and falls, but this has gotten better , also patella reflexes were 3+, but no other cord signs. Consider referral to neurosurgery if this becomes a problem again.  TPI given in left trapezius and levator scapulae, after she signed a consent, after injection instructions were given, patient tolerated injection well. Follow up with Dr. Riley Kill

## 2012-09-14 NOTE — Patient Instructions (Signed)
Try to stay as active as pain permits. 

## 2012-09-15 ENCOUNTER — Encounter: Payer: Medicaid Other | Admitting: Physical Medicine and Rehabilitation

## 2012-10-13 ENCOUNTER — Ambulatory Visit: Payer: Self-pay | Admitting: Family Medicine

## 2012-10-15 ENCOUNTER — Encounter
Payer: Medicaid Other | Attending: Physical Medicine and Rehabilitation | Admitting: Physical Medicine and Rehabilitation

## 2012-10-15 ENCOUNTER — Encounter: Payer: Self-pay | Admitting: Physical Medicine and Rehabilitation

## 2012-10-15 VITALS — BP 138/70 | HR 84 | Resp 17 | Ht 63.0 in | Wt 151.0 lb

## 2012-10-15 DIAGNOSIS — M25551 Pain in right hip: Secondary | ICD-10-CM

## 2012-10-15 DIAGNOSIS — F411 Generalized anxiety disorder: Secondary | ICD-10-CM | POA: Insufficient documentation

## 2012-10-15 DIAGNOSIS — IMO0001 Reserved for inherently not codable concepts without codable children: Secondary | ICD-10-CM | POA: Insufficient documentation

## 2012-10-15 DIAGNOSIS — IMO0002 Reserved for concepts with insufficient information to code with codable children: Secondary | ICD-10-CM | POA: Insufficient documentation

## 2012-10-15 DIAGNOSIS — M7650 Patellar tendinitis, unspecified knee: Secondary | ICD-10-CM

## 2012-10-15 DIAGNOSIS — F329 Major depressive disorder, single episode, unspecified: Secondary | ICD-10-CM | POA: Insufficient documentation

## 2012-10-15 DIAGNOSIS — M25559 Pain in unspecified hip: Secondary | ICD-10-CM | POA: Insufficient documentation

## 2012-10-15 DIAGNOSIS — R269 Unspecified abnormalities of gait and mobility: Secondary | ICD-10-CM | POA: Insufficient documentation

## 2012-10-15 DIAGNOSIS — Z5181 Encounter for therapeutic drug level monitoring: Secondary | ICD-10-CM

## 2012-10-15 DIAGNOSIS — M47812 Spondylosis without myelopathy or radiculopathy, cervical region: Secondary | ICD-10-CM

## 2012-10-15 DIAGNOSIS — M7530 Calcific tendinitis of unspecified shoulder: Secondary | ICD-10-CM

## 2012-10-15 DIAGNOSIS — M1710 Unilateral primary osteoarthritis, unspecified knee: Secondary | ICD-10-CM | POA: Insufficient documentation

## 2012-10-15 DIAGNOSIS — M719 Bursopathy, unspecified: Secondary | ICD-10-CM | POA: Insufficient documentation

## 2012-10-15 DIAGNOSIS — Z5987 Material hardship due to limited financial resources, not elsewhere classified: Secondary | ICD-10-CM | POA: Insufficient documentation

## 2012-10-15 DIAGNOSIS — M7531 Calcific tendinitis of right shoulder: Secondary | ICD-10-CM

## 2012-10-15 DIAGNOSIS — X58XXXA Exposure to other specified factors, initial encounter: Secondary | ICD-10-CM | POA: Insufficient documentation

## 2012-10-15 DIAGNOSIS — M170 Bilateral primary osteoarthritis of knee: Secondary | ICD-10-CM

## 2012-10-15 DIAGNOSIS — M67919 Unspecified disorder of synovium and tendon, unspecified shoulder: Secondary | ICD-10-CM | POA: Insufficient documentation

## 2012-10-15 DIAGNOSIS — Z598 Other problems related to housing and economic circumstances: Secondary | ICD-10-CM | POA: Insufficient documentation

## 2012-10-15 DIAGNOSIS — F3289 Other specified depressive episodes: Secondary | ICD-10-CM | POA: Insufficient documentation

## 2012-10-15 DIAGNOSIS — Z79899 Other long term (current) drug therapy: Secondary | ICD-10-CM

## 2012-10-15 MED ORDER — OXYCODONE-ACETAMINOPHEN 10-325 MG PO TABS: 1.0000 | ORAL_TABLET | Freq: Four times a day (QID) | ORAL | Status: DC | PRN

## 2012-10-15 MED ORDER — FENTANYL 25 MCG/HR TD PT72: 1.0000 | MEDICATED_PATCH | TRANSDERMAL | Status: DC

## 2012-10-15 NOTE — Progress Notes (Signed)
Subjective:    Patient ID: Summer Estrada, female    DOB: 09/29/1960, 52 y.o.   MRN: [id number]  HPI Summer Estrada is back regarding her pain complaints. She is complaining of more hip pain over the last several weeks, because of her antalgic gait. Her knee surgery has been postponed due to the fact that her daughter just had surgery and she will be her primary caregiver. She postponed her meniscus surgery on the right knee, because of personal/financial issues. Unfortunately she lost her home, she is now living with relatives. She also complains about increased hip pain.  Pain Inventory Average Pain 7 Pain Right Now 7 My pain is sharp, dull and aching  In the last 24 hours, has pain interfered with the following? General activity 9 Relation with others 9 Enjoyment of life 9 What TIME of day is your pain at its worst? na Sleep (in general) NA  Pain is worse with: walking, sitting and standing Pain improves with: heat/ice, medication and injections Relief from Meds: 7  Mobility Do you have any goals in this area?  no  Function Do you have any goals in this area?  no  Neuro/Psych No problems in this area  Prior Studies Any changes since last visit?  no  Physicians involved in your care Any changes since last visit?  no   Family History  Problem Relation Age of Onset  . Kidney disease Mother   . Heart disease Father    History   Social History  . Marital Status: Married    Spouse Name: Summer Estrada    Number of Children: Summer Estrada  . Years of Education: Summer Estrada   Social History Main Topics  . Smoking status: Current Every Day Smoker -- 1.50 packs/day  . Smokeless tobacco: Never Used  . Alcohol Use: No  . Drug Use: No  . Sexually Active: Yes    Birth Control/ Protection: Post-menopausal   Other Topics Concern  . None   Social History Narrative   WIDOW   CHILDREN; ADULT GIRLS; 3   DISABLED   Past Surgical History  Procedure Laterality Date  . Total hip arthroplasty  2010     lt hip  . Cesarean section      x2  . Tubal ligation     Past Medical History  Diagnosis Date  . Depression   . Knee pain   . Arthritis   . Chronic pain   . GERD (gastroesophageal reflux disease)     occ  . Wears glasses   . Wears dentures     upper   BP 138/70  Pulse 84  Resp 17  Ht 5\' 3"  (1.6 m)  Wt 151 lb (68.493 kg)  BMI 26.76 kg/m2  SpO2 98%     Review of Systems  All other systems reviewed and are negative.       Objective:   Physical Exam Constitutional: She is oriented to person, place, and time. She appears well-developed and well-nourished.  Walks with a cane  HENT:  Head: Normocephalic.  Musculoskeletal: She exhibits tenderness, medial aspect of right knee.  Neurological: She is alert and oriented to person, place, and time.  Skin: Skin is warm and dry.  Psychiatric: normal mood   Symmetric normal motor tone is noted throughout. Normal muscle bulk. Muscle strength grossly intact. ROM of spine is restricted. Fine motor movements are normal in both hands.  DTR in the upper and lower extremity are present and symmetric 2-3+. No clonus is noted.  Patient arises from chair with mild difficulty. Wide based gait with a cane. . Tandem walk is very difficult, only possible with some help. No pronator drift. Rhomberg negative. Right hip : pain with internal rotation       Assessment & Plan:  1. Chronic left rotator cuff syndrome with myofascial shoulder girdle pain-  2. Cervical DDD and spondylosis- C5-6 disk is most involved. Balance problems  3. Anxiety and depression.  4. OA/meniscal injury right knee, medial meniscus tear.  5. Neck pain with muscle tension/spasms  6. Right hip pain, internal rotation painful, ordered X-ray of right hip, consider PT, aquatics,injection or referral, depending on results PLAN:  1. Medial meniscus tear,scheduled surgery on June 6th , she postponed this because of financial problems, and the threat of loosing her home   2. Voltaren gel to knees qid, also use ice. Refilled the Voltaren gel  3. Continue with fentanyl patch. She needs to be sure that the patch is adhering appropriately.  4. Percocet for breakthrough pain. #75 10/325 Was refilled today.  5. Knee exercises in a closed kinetic chain only.  6. Consider surgical evaluation as well especially for the C5-C6 level. The patient had complained about balance problems and falls, but this has gotten better , also patella reflexes were 3+, but no other cord signs. Consider referral to neurosurgery if this becomes a problem again.   Follow up with in 1 month

## 2012-10-15 NOTE — Patient Instructions (Signed)
Stay as active as pain permits 

## 2012-10-23 ENCOUNTER — Other Ambulatory Visit: Payer: Self-pay | Admitting: Family Medicine

## 2012-10-26 NOTE — Telephone Encounter (Signed)
Last lipids 03/25/12

## 2012-10-27 ENCOUNTER — Telehealth: Payer: Self-pay

## 2012-10-27 NOTE — Telephone Encounter (Signed)
Tried to contact patient but number on file is no longer in service.  Left a message on [name] voicemail to have patient call us regarding her urine drug screen.  Discharge letter mailed today with a list of other pain clinics and directions on how to decrease her medication.

## 2012-10-27 NOTE — Telephone Encounter (Signed)
Message copied by Judd Gaudier on Wed Oct 27, 2012 10:52 AM ------      Message from: Su Monks      Created: Tue Oct 26, 2012  4:27 PM       Talked to Dr. Riley Kill, she will be d/c, she just got her meds refilled, she can wean down her Oxycodone by 1 tablet per day per week until finished,       We have to give her a 5 pack of the fentanyl patches , which she can take after she finished her patches, and when finished d/c ------

## 2012-10-29 ENCOUNTER — Other Ambulatory Visit: Payer: Self-pay | Admitting: Physical Medicine & Rehabilitation

## 2012-10-29 ENCOUNTER — Other Ambulatory Visit: Payer: Self-pay

## 2012-10-29 NOTE — Telephone Encounter (Signed)
Last seen 03/24/12  FPW  Last lipids 03/25/12

## 2012-11-01 NOTE — Telephone Encounter (Signed)
fpw to address

## 2012-11-01 NOTE — Telephone Encounter (Signed)
NTBS!! Need to check labs especially liver.

## 2012-11-02 ENCOUNTER — Other Ambulatory Visit: Payer: Self-pay | Admitting: Family Medicine

## 2012-11-02 ENCOUNTER — Other Ambulatory Visit: Payer: Self-pay

## 2012-11-02 MED ORDER — FLUOXETINE HCL 40 MG PO CAPS: 40.0000 mg | ORAL_CAPSULE | Freq: Every day | ORAL | Status: DC

## 2012-11-02 NOTE — Telephone Encounter (Signed)
Last seen 03/24/12  FPW

## 2012-11-02 NOTE — Telephone Encounter (Signed)
[facility] notified and requested them to have pt call us for appt

## 2012-11-11 ENCOUNTER — Other Ambulatory Visit: Payer: Self-pay | Admitting: Family Medicine

## 2012-11-12 ENCOUNTER — Ambulatory Visit: Payer: Self-pay | Admitting: Physical Medicine and Rehabilitation

## 2012-11-22 ENCOUNTER — Other Ambulatory Visit: Payer: Self-pay | Admitting: Family Medicine

## 2012-11-24 NOTE — Telephone Encounter (Signed)
Patient needs to be seen. Has exceeded time since last visit. Needs to bring all medications to next appointment. I don't see her on the schedule.

## 2012-11-24 NOTE — Telephone Encounter (Signed)
Last seen 03/24/12  FPW  Has appt. Scheduled here for tommorow

## 2012-11-25 ENCOUNTER — Encounter: Payer: Self-pay | Admitting: Family Medicine

## 2012-11-25 ENCOUNTER — Ambulatory Visit (INDEPENDENT_AMBULATORY_CARE_PROVIDER_SITE_OTHER): Payer: Medicaid Other | Admitting: Family Medicine

## 2012-11-25 VITALS — BP 113/73 | HR 65 | Temp 97.4°F | Wt 148.4 lb

## 2012-11-25 DIAGNOSIS — H6690 Otitis media, unspecified, unspecified ear: Secondary | ICD-10-CM

## 2012-11-25 DIAGNOSIS — E0590 Thyrotoxicosis, unspecified without thyrotoxic crisis or storm: Secondary | ICD-10-CM

## 2012-11-25 DIAGNOSIS — F419 Anxiety disorder, unspecified: Secondary | ICD-10-CM

## 2012-11-25 DIAGNOSIS — F329 Major depressive disorder, single episode, unspecified: Secondary | ICD-10-CM

## 2012-11-25 DIAGNOSIS — E042 Nontoxic multinodular goiter: Secondary | ICD-10-CM

## 2012-11-25 DIAGNOSIS — E785 Hyperlipidemia, unspecified: Secondary | ICD-10-CM | POA: Insufficient documentation

## 2012-11-25 DIAGNOSIS — F341 Dysthymic disorder: Secondary | ICD-10-CM

## 2012-11-25 DIAGNOSIS — F3289 Other specified depressive episodes: Secondary | ICD-10-CM

## 2012-11-25 DIAGNOSIS — M170 Bilateral primary osteoarthritis of knee: Secondary | ICD-10-CM

## 2012-11-25 DIAGNOSIS — K219 Gastro-esophageal reflux disease without esophagitis: Secondary | ICD-10-CM | POA: Insufficient documentation

## 2012-11-25 DIAGNOSIS — M129 Arthropathy, unspecified: Secondary | ICD-10-CM

## 2012-11-25 DIAGNOSIS — M1710 Unilateral primary osteoarthritis, unspecified knee: Secondary | ICD-10-CM

## 2012-11-25 DIAGNOSIS — F17200 Nicotine dependence, unspecified, uncomplicated: Secondary | ICD-10-CM

## 2012-11-25 DIAGNOSIS — G894 Chronic pain syndrome: Secondary | ICD-10-CM

## 2012-11-25 DIAGNOSIS — F32A Depression, unspecified: Secondary | ICD-10-CM

## 2012-11-25 DIAGNOSIS — M1990 Unspecified osteoarthritis, unspecified site: Secondary | ICD-10-CM

## 2012-11-25 DIAGNOSIS — IMO0002 Reserved for concepts with insufficient information to code with codable children: Secondary | ICD-10-CM

## 2012-11-25 DIAGNOSIS — D649 Anemia, unspecified: Secondary | ICD-10-CM

## 2012-11-25 MED ORDER — GABAPENTIN 300 MG PO CAPS: ORAL_CAPSULE | ORAL | Status: DC

## 2012-11-25 MED ORDER — FENOFIBRATE 145 MG PO TABS: ORAL_TABLET | ORAL | Status: DC

## 2012-11-25 MED ORDER — MELOXICAM 15 MG PO TABS: ORAL_TABLET | ORAL | Status: DC

## 2012-11-25 MED ORDER — AMOXICILLIN 875 MG PO TABS: 875.0000 mg | ORAL_TABLET | Freq: Two times a day (BID) | ORAL | Status: DC

## 2012-11-25 MED ORDER — FLUOXETINE HCL 40 MG PO CAPS: 40.0000 mg | ORAL_CAPSULE | Freq: Every day | ORAL | Status: DC

## 2012-11-25 MED ORDER — BUPROPION HCL 100 MG PO TABS: 100.0000 mg | ORAL_TABLET | Freq: Three times a day (TID) | ORAL | Status: DC

## 2012-11-25 NOTE — Progress Notes (Signed)
Patient ID: Summer Estrada, female   DOB: [birth year], 52 y.o.   MRN: [id number] SUBJECTIVE: CC: Chief Complaint  Patient presents with  . Follow-up    med refill needs another pain management doctor  ?about celebrex     HPI:  Patient was discharged from the Pain clinic in [place]. Records available in EPIC. Found to have cocaine on drug screen. She claims that was a mistake on her part that she had a terrible toothache and ran out of medications and a friend gave her cocaine to put in the carious tooth.  Here for follow up of her other multiple medical problems.  She has been off pain narcotics and does not have another pain management clinic available.  Past Medical History  Diagnosis Date  . Knee pain   . Chronic pain   . Wears glasses   . Wears dentures     upper  . GERD (gastroesophageal reflux disease)     occ  . Depression   . Arthritis    Past Surgical History  Procedure Laterality Date  . Total hip arthroplasty  2010    lt hip  . Cesarean section      x2  . Tubal ligation     History   Social History  . Marital Status: Widowed    Spouse Name: N/A    Number of Children: N/A  . Years of Education: N/A   Occupational History  . Not on file.   Social History Main Topics  . Smoking status: Current Every Day Smoker -- 1.50 packs/day  . Smokeless tobacco: Never Used  . Alcohol Use: No  . Drug Use: No  . Sexual Activity: Yes    Birth Control/ Protection: Post-menopausal   Other Topics Concern  . Not on file   Social History Narrative   WIDOW   CHILDREN; ADULT GIRLS; 3   DISABLED   Family History  Problem Relation Age of Onset  . Kidney disease Mother   . Heart disease Father    Current Outpatient Prescriptions on File Prior to Visit  Medication Sig Dispense Refill  . aspirin 81 MG tablet Take 81 mg by mouth daily.       . carisoprodol (SOMA) 350 MG tablet TAKE 1 TABLET BY MOUTH EVERY 8 HOURS AS NEEDED  60 tablet  3  . Cyanocobalamin  (VITAMIN B 12) 100 MCG LOZG Take 1 lozenge by mouth 2 (two) times daily.      . diclofenac sodium (VOLTAREN) 1 % GEL Apply 2 g topically 4 (four) times daily.  2 Tube  2  . fentaNYL (DURAGESIC) 25 MCG/HR Place 1 patch (25 mcg total) onto the skin every 3 (three) days.  10 patch  0  . multivitamin (THERAGRAN) per tablet Take 1 tablet by mouth daily.      Marland Kitchen oxyCODONE-acetaminophen (PERCOCET) 10-325 MG per tablet Take 1 tablet by mouth every 6 (six) hours as needed for pain.  75 tablet  0  . ranitidine (ZANTAC) 150 MG tablet Take 150 mg by mouth daily.      . traZODone (DESYREL) 50 MG tablet TAKE 1 TABLET BY MOUTH EVERY NIGHT AT BEDTIME  30 tablet  3  . VITAMIN D, CHOLECALCIFEROL, PO Take by mouth.       No current facility-administered medications on file prior to visit.   Allergies  Allergen Reactions  . Codeine Nausea And Vomiting and Rash    Rash all over body; severe nausea and vomiting.  There is no immunization history on file for this patient. Prior to Admission medications   Medication Sig Start Date End Date Taking? Authorizing Provider  amoxicillin (AMOXIL) 875 MG tablet Take 1 tablet (875 mg total) by mouth 2 (two) times daily. 11/25/12   Ileana Ladd, MD  aspirin 81 MG tablet Take 81 mg by mouth daily.     Historical Provider, MD  buPROPion (WELLBUTRIN) 100 MG tablet Take 1 tablet (100 mg total) by mouth 3 (three) times daily. 11/25/12   Ileana Ladd, MD  carisoprodol (SOMA) 350 MG tablet TAKE 1 TABLET BY MOUTH EVERY 8 HOURS AS NEEDED 09/01/12   Ranelle Oyster, MD  Cyanocobalamin (VITAMIN B 12) 100 MCG LOZG Take 1 lozenge by mouth 2 (two) times daily.    Historical Provider, MD  diclofenac sodium (VOLTAREN) 1 % GEL Apply 2 g topically 4 (four) times daily. 07/19/12   Clydie Braun Prueter, PA-C  fenofibrate (TRICOR) 145 MG tablet TAKE 1 TABLET BY MOUTH EVERY DAY 11/25/12   Ileana Ladd, MD  fentaNYL (DURAGESIC) 25 MCG/HR Place 1 patch (25 mcg total) onto the skin every 3 (three)  days. 10/15/12   Clydie Braun Prueter, PA-C  FLUoxetine (PROZAC) 40 MG capsule Take 1 capsule (40 mg total) by mouth daily. 11/25/12   Ileana Ladd, MD  gabapentin (NEURONTIN) 300 MG capsule TAKE 2 CAPSULES BY MOUTH THREE TIMES DAILY 11/25/12   Ileana Ladd, MD  meloxicam (MOBIC) 15 MG tablet TAKE 1 TABLET BY MOUTH EVERY DAY 11/25/12   Ileana Ladd, MD  multivitamin [facility]) per tablet Take 1 tablet by mouth daily.    Historical Provider, MD  oxyCODONE-acetaminophen (PERCOCET) 10-325 MG per tablet Take 1 tablet by mouth every 6 (six) hours as needed for pain. 10/15/12   Clydie Braun Prueter, PA-C  ranitidine (ZANTAC) 150 MG tablet Take 150 mg by mouth daily.    Historical Provider, MD  traZODone (DESYREL) 50 MG tablet TAKE 1 TABLET BY MOUTH EVERY NIGHT AT BEDTIME 08/19/12   Ranelle Oyster, MD  VITAMIN D, CHOLECALCIFEROL, PO Take by mouth.    Historical Provider, MD    ROS: As above in the HPI. All other systems are stable or negative.  OBJECTIVE: APPEARANCE:  Patient in no acute distress.The patient appeared well nourished and normally developed. Acyanotic. Waist: VITAL SIGNS:  SKIN: warm and  Dry without overt rashes, tattoos and scars  HEAD and Neck: without JVD, Head and scalp: normal Eyes:No scleral icterus. Fundi normal, eye movements normal. Ears: Auricle normal, canal normal, Tympanic membranes normal, insufflation normal. Nose: normal Throat: normal Neck & thyroid: normal  CHEST & LUNGS: Chest wall: normal Lungs: Clear  CVS: Reveals the PMI to be normally located. Regular rhythm, First and Second Heart sounds are normal,  absence of murmurs, rubs or gallops. Peripheral vasculature: Radial pulses: normal Dorsal pedis pulses: normal Posterior pulses: normal  ABDOMEN:  Appearance: normal Benign, no organomegaly, no masses, no Abdominal Aortic enlargement. No Guarding , no rebound. No Bruits. Bowel sounds: normal  RECTAL: N/A GU: N/A  EXTREMETIES:  nonedematous.  MUSCULOSKELETAL:  Spine: normal Joints: intact  NEUROLOGIC: oriented to time,place and person; nonfocal. Strength is normal Sensory is normal Reflexes are normal Cranial Nerves are normal.  ASSESSMENT: Hyperthyroidism - Plan: Thyroid Panel With TSH  Depression - Plan: FLUoxetine (PROZAC) 40 MG capsule, buPROPion (WELLBUTRIN) 100 MG tablet  Anxiety and depression  Arthritis - Plan: meloxicam (MOBIC) 15 MG tablet  Multinodular goiter - Plan: Thyroid Panel With  TSH  Osteoarthritis of both knees  Smoker  HLD (hyperlipidemia) - Plan: CMP14+EGFR, NMR, lipoprofile, fenofibrate (TRICOR) 145 MG tablet  Anemia - Plan: POCT CBC, Iron, Ferritin  OM (otitis media), bilateral - Plan: amoxicillin (AMOXIL) 875 MG tablet  Chronic pain syndrome - Plan: gabapentin (NEURONTIN) 300 MG capsule   PLAN: Orders Placed This Encounter  Procedures  . CMP14+EGFR  . NMR, lipoprofile  . Iron  . Ferritin  . Thyroid Panel With TSH  . POCT CBC    Meds ordered this encounter  Medications  . gabapentin (NEURONTIN) 300 MG capsule    Sig: TAKE 2 CAPSULES BY MOUTH THREE TIMES DAILY    Dispense:  180 capsule    Refill:  2  . meloxicam (MOBIC) 15 MG tablet    Sig: TAKE 1 TABLET BY MOUTH EVERY DAY    Dispense:  30 tablet    Refill:  2  . FLUoxetine (PROZAC) 40 MG capsule    Sig: Take 1 capsule (40 mg total) by mouth daily.    Dispense:  30 capsule    Refill:  5    Last refill. Past due for visit  . buPROPion (WELLBUTRIN) 100 MG tablet    Sig: Take 1 tablet (100 mg total) by mouth 3 (three) times daily.    Dispense:  30 tablet    Refill:  5  . fenofibrate (TRICOR) 145 MG tablet    Sig: TAKE 1 TABLET BY MOUTH EVERY DAY    Dispense:  30 tablet    Refill:  5    Needs to be seen before next refill  . amoxicillin (AMOXIL) 875 MG tablet    Sig: Take 1 tablet (875 mg total) by mouth 2 (two) times daily.    Dispense:  20 tablet    Refill:  0   Advised patient that I am not a  pain management specialist. Also, her history of illicit drug use contradicts the safe use of restricted pain medicines.  Patient states understanding and requests from this office Rx refills of the other non opiod medications.  Supportive therapy and recommendations, for therapy made to patient.  Return in about 3 months (around 02/24/2013) for Recheck medical problems.  Lashara Urey P. Modesto Charon, M.D.

## 2012-11-26 LAB — POCT CBC
Granulocyte percent: 54.2 %G (ref 37–80)
HCT, POC: 41.5 % (ref 37.7–47.9)
Hemoglobin: 13.8 g/dL (ref 12.2–16.2)
Lymph, poc: 3.4 (ref 0.6–3.4)
MCH, POC: 26.9 pg — AB (ref 27–31.2)
MCHC: 33.3 g/dL (ref 31.8–35.4)
MCV: 80.8 fL (ref 80–97)
MPV: 7.8 fL (ref 0–99.8)
POC Granulocyte: 4.4 (ref 2–6.9)
POC LYMPH PERCENT: 41.7 %L (ref 10–50)
Platelet Count, POC: 332 10*3/uL (ref 142–424)
RBC: 5.1 M/uL (ref 4.04–5.48)
RDW, POC: 15.8 %
WBC: 8.1 10*3/uL (ref 4.6–10.2)

## 2012-11-27 LAB — NMR, LIPOPROFILE
Cholesterol: 180 mg/dL (ref <200)
HDL Cholesterol by NMR: 64 mg/dL (ref >=40)
HDL Particle Number: 46.6 umol/L (ref >=30.5)
LDL Particle Number: 1261 nmol/L — ABNORMAL HIGH (ref <1000)
LDL Size: 20.9 nm (ref >20.5)
LDLC SERPL CALC-MCNC: 99 mg/dL (ref <100)
LP-IR Score: 28 (ref <=45)
Small LDL Particle Number: 472 nmol/L (ref <=527)
Triglycerides by NMR: 83 mg/dL (ref <150)

## 2012-11-27 LAB — THYROID PANEL WITH TSH
Free Thyroxine Index: 2.1 (ref 1.2–4.9)
T3 Uptake Ratio: 29 % (ref 24–39)
T4, Total: 7.4 ug/dL (ref 4.5–12.0)
TSH: 0.471 u[IU]/mL (ref 0.450–4.500)

## 2012-11-27 LAB — CMP14+EGFR
ALT: 13 IU/L (ref 0–32)
AST: 13 IU/L (ref 0–40)
Albumin/Globulin Ratio: 2.4 (ref 1.1–2.5)
Albumin: 4.7 g/dL (ref 3.5–5.5)
Alkaline Phosphatase: 52 IU/L (ref 39–117)
BUN/Creatinine Ratio: 16 (ref 9–23)
BUN: 12 mg/dL (ref 6–24)
CO2: 24 mmol/L (ref 18–29)
Calcium: 10.2 mg/dL (ref 8.7–10.2)
Chloride: 101 mmol/L (ref 97–108)
Creatinine, Ser: 0.76 mg/dL (ref 0.57–1.00)
GFR calc Af Amer: 104 mL/min/{1.73_m2} (ref >59)
GFR calc non Af Amer: 90 mL/min/{1.73_m2} (ref >59)
Globulin, Total: 2 g/dL (ref 1.5–4.5)
Glucose: 78 mg/dL (ref 65–99)
Potassium: 4.8 mmol/L (ref 3.5–5.2)
Sodium: 143 mmol/L (ref 134–144)
Total Bilirubin: 0.2 mg/dL (ref 0.0–1.2)
Total Protein: 6.7 g/dL (ref 6.0–8.5)

## 2012-11-27 LAB — FERRITIN: Ferritin: 170 ng/mL — ABNORMAL HIGH (ref 15–150)

## 2012-11-27 LAB — IRON: Iron: 65 ug/dL (ref 35–155)

## 2012-11-30 ENCOUNTER — Other Ambulatory Visit: Payer: Self-pay | Admitting: Physical Medicine and Rehabilitation

## 2012-11-30 NOTE — Telephone Encounter (Signed)
Patient did come in for appointment

## 2012-12-10 ENCOUNTER — Other Ambulatory Visit: Payer: Self-pay | Admitting: Family Medicine

## 2012-12-10 ENCOUNTER — Other Ambulatory Visit: Payer: Self-pay | Admitting: Physical Medicine & Rehabilitation

## 2012-12-27 ENCOUNTER — Ambulatory Visit (INDEPENDENT_AMBULATORY_CARE_PROVIDER_SITE_OTHER): Payer: Medicaid Other | Admitting: Family Medicine

## 2012-12-27 ENCOUNTER — Encounter: Payer: Self-pay | Admitting: Family Medicine

## 2012-12-27 VITALS — BP 103/55 | HR 59 | Temp 96.5°F | Ht 63.0 in | Wt 154.0 lb

## 2012-12-27 DIAGNOSIS — M25559 Pain in unspecified hip: Secondary | ICD-10-CM

## 2012-12-27 DIAGNOSIS — M25551 Pain in right hip: Secondary | ICD-10-CM

## 2012-12-27 MED ORDER — KETOROLAC TROMETHAMINE 60 MG/2ML IM SOLN
60.0000 mg | Freq: Once | INTRAMUSCULAR | Status: AC
  Administered 2012-12-27: 60 mg via INTRAMUSCULAR

## 2012-12-27 NOTE — Patient Instructions (Signed)
Hip Pain  The hips join the upper legs to the lower pelvis. The bones, cartilage, tendons, and muscles of the hip joint perform a lot of work each day holding your body weight and allowing you to move around.  Hip pain is a common symptom. It can range from a minor ache to severe pain on 1 or both hips. Pain may be felt on the inside of the hip joint near the groin, or the outside near the buttocks and upper thigh. There may be swelling or stiffness as well. It occurs more often when a person walks or performs activity. There are many reasons hip pain can develop.  CAUSES   It is important to work with your caregiver to identify the cause since many conditions can impact the bones, cartilage, muscles, and tendons of the hips. Causes for hip pain include:  · Broken (fractured) bones.  · Separation of the thighbone from the hip socket (dislocation).  · Torn cartilage of the hip joint.  · Swelling (inflammation) of a tendon (tendonitis), the sac within the hip joint (bursitis), or a joint.  · A weakening in the abdominal wall (hernia), affecting the nerves to the hip.  · Arthritis in the hip joint or lining of the hip joint.  · Pinched nerves in the back, hip, or upper thigh.  · A bulging disc in the spine (herniated disc).  · Rarely, bone infection or cancer.  DIAGNOSIS   The location of your hip pain will help your caregiver understand what may be causing the pain. A diagnosis is based on your medical history, your symptoms, results from your physical exam, and results from diagnostic tests. Diagnostic tests may include X-ray exams, a computerized magnetic scan (magnetic resonance imaging, MRI), or bone scan.  TREATMENT   Treatment will depend on the cause of your hip pain. Treatment may include:  · Limiting activities and resting until symptoms improve.  · Crutches or other walking supports (a cane or brace).  · Ice, elevation, and compression.  · Physical therapy or home exercises.  · Shoe inserts or special  shoes.  · Losing weight.  · Medications to reduce pain.  · Undergoing surgery.  HOME CARE INSTRUCTIONS   · Only take over-the-counter or prescription medicines for pain, discomfort, or fever as directed by your caregiver.  · Put ice on the injured area:  · Put ice in a plastic bag.  · Place a towel between your skin and the bag.  · Leave the ice on for 15-20 minutes at a time, 3-4 times a day.  · Keep your leg raised (elevated) when possible to lessen swelling.  · Avoid activities that cause pain.  · Follow specific exercises as directed by your caregiver.  · Sleep with a pillow between your legs on your most comfortable side.  · Record how often you have hip pain, the location of the pain, and what it feels like. This information may be helpful to you and your caregiver.  · Ask your caregiver about returning to work or sports and whether you should drive.  · Follow up with your caregiver for further exams, therapy, or testing as directed.  SEEK MEDICAL CARE IF:   · Your pain or swelling continues or worsens after 1 week.  · You are feeling unwell or have chills.  · You have increasing difficulty with walking.  · You have a loss of sensation or other new symptoms.  · You have questions or concerns.  SEEK   IMMEDIATE MEDICAL CARE IF:   · You cannot put weight on the affected hip.  · You have fallen.  · You have a sudden increase in pain and swelling in your hip.  · You have a fever.  MAKE SURE YOU:   · Understand these instructions.  · Will watch your condition.  · Will get help right away if you are not doing well or get worse.  Document Released: 08/21/2009 Document Revised: 05/26/2011 Document Reviewed: 08/21/2009  [employer]® Patient Information ©2014 [employer].

## 2012-12-27 NOTE — Progress Notes (Signed)
  Subjective:    Patient ID: Summer Estrada, female    DOB: 03/07/1961, 52 y.o.   MRN: [id number]  HPI This 52 y.o. female presents for evaluation of right hip pain.  She Has hx of DJD of the right hip.  She states she has severe right hip pain. She was seeing pain management in [place] and was fired due To illicit drug use.  She states she has stopped using and wants referral To another pain specialist.  She wants a pain shot.   Review of Systems C/o right hip pain No chest pain, SOB, HA, dizziness, vision change, N/V, diarrhea, constipation, dysuria, urinary urgency or frequency, or rash.     Objective:   Physical Exam Vital signs noted  Well developed well nourished female.  HEENT - Head atraumatic Normocephalic                Eyes - PERRLA, Conjuctiva - clear Sclera- Clear EOMI                Ears - EAC's Wnl TM's Wnl Gross Hearing WNL.                Throat - oropharanx wnl Respiratory - Lungs CTA bilateral Cardiac - RRR S1 and S2 without murmur MS - TTP right hip.       Assessment & Plan:  Right hip pain - Plan: Ambulatory referral to Pain Clinic, ketorolac (TORADOL) injection 60 mg. Follow up with Pain management.  Deatra Canter FNP

## 2012-12-30 ENCOUNTER — Telehealth: Payer: Self-pay

## 2012-12-30 ENCOUNTER — Other Ambulatory Visit: Payer: Self-pay | Admitting: Family Medicine

## 2012-12-30 NOTE — Telephone Encounter (Signed)
Patient calling about her pain referral  Nothing in workqueue and she was told if she didn't hear anything in 2 days to call.  Pain referrals take atleast 2 weeks

## 2013-01-20 ENCOUNTER — Other Ambulatory Visit: Payer: Self-pay

## 2013-01-24 ENCOUNTER — Other Ambulatory Visit: Payer: Self-pay | Admitting: Family Medicine

## 2013-02-28 ENCOUNTER — Other Ambulatory Visit: Payer: Self-pay | Admitting: Family Medicine

## 2013-03-08 ENCOUNTER — Other Ambulatory Visit: Payer: Self-pay | Admitting: *Deleted

## 2013-03-08 MED ORDER — BUPROPION HCL 100 MG PO TABS: 100.0000 mg | ORAL_TABLET | Freq: Three times a day (TID) | ORAL | Status: DC

## 2013-04-04 ENCOUNTER — Telehealth: Payer: Self-pay | Admitting: Family Medicine

## 2013-04-15 ENCOUNTER — Other Ambulatory Visit: Payer: Self-pay | Admitting: Family Medicine

## 2013-04-18 ENCOUNTER — Other Ambulatory Visit: Payer: Self-pay

## 2013-04-18 DIAGNOSIS — E785 Hyperlipidemia, unspecified: Secondary | ICD-10-CM

## 2013-04-18 MED ORDER — FENOFIBRATE 145 MG PO TABS: ORAL_TABLET | ORAL | Status: DC

## 2013-04-21 ENCOUNTER — Encounter: Payer: Self-pay | Admitting: Family Medicine

## 2013-04-21 ENCOUNTER — Ambulatory Visit (INDEPENDENT_AMBULATORY_CARE_PROVIDER_SITE_OTHER): Payer: Medicaid Other | Admitting: Family Medicine

## 2013-04-21 VITALS — BP 120/75 | HR 64 | Temp 97.2°F | Ht 63.0 in | Wt 163.6 lb

## 2013-04-21 DIAGNOSIS — E785 Hyperlipidemia, unspecified: Secondary | ICD-10-CM

## 2013-04-21 DIAGNOSIS — F329 Major depressive disorder, single episode, unspecified: Secondary | ICD-10-CM

## 2013-04-21 DIAGNOSIS — M170 Bilateral primary osteoarthritis of knee: Secondary | ICD-10-CM

## 2013-04-21 DIAGNOSIS — F17200 Nicotine dependence, unspecified, uncomplicated: Secondary | ICD-10-CM

## 2013-04-21 DIAGNOSIS — M7650 Patellar tendinitis, unspecified knee: Secondary | ICD-10-CM

## 2013-04-21 DIAGNOSIS — IMO0002 Reserved for concepts with insufficient information to code with codable children: Secondary | ICD-10-CM

## 2013-04-21 DIAGNOSIS — M7061 Trochanteric bursitis, right hip: Secondary | ICD-10-CM

## 2013-04-21 DIAGNOSIS — G8929 Other chronic pain: Secondary | ICD-10-CM

## 2013-04-21 DIAGNOSIS — M76899 Other specified enthesopathies of unspecified lower limb, excluding foot: Secondary | ICD-10-CM

## 2013-04-21 DIAGNOSIS — M1710 Unilateral primary osteoarthritis, unspecified knee: Secondary | ICD-10-CM

## 2013-04-21 DIAGNOSIS — K219 Gastro-esophageal reflux disease without esophagitis: Secondary | ICD-10-CM

## 2013-04-21 DIAGNOSIS — F341 Dysthymic disorder: Secondary | ICD-10-CM

## 2013-04-21 DIAGNOSIS — E042 Nontoxic multinodular goiter: Secondary | ICD-10-CM

## 2013-04-21 DIAGNOSIS — M545 Low back pain, unspecified: Secondary | ICD-10-CM

## 2013-04-21 DIAGNOSIS — F419 Anxiety disorder, unspecified: Secondary | ICD-10-CM | POA: Insufficient documentation

## 2013-04-21 LAB — POCT URINALYSIS DIPSTICK
Bilirubin, UA: NEGATIVE
Blood, UA: NEGATIVE
Glucose, UA: NEGATIVE
Ketones, UA: NEGATIVE
Leukocytes, UA: NEGATIVE
Nitrite, UA: NEGATIVE
Protein, UA: NEGATIVE
Spec Grav, UA: 1.01
Urobilinogen, UA: NEGATIVE
pH, UA: 6

## 2013-04-21 LAB — POCT UA - MICROSCOPIC ONLY
Bacteria, U Microscopic: NEGATIVE
Casts, Ur, LPF, POC: NEGATIVE
Crystals, Ur, HPF, POC: NEGATIVE
Mucus, UA: NEGATIVE
RBC, urine, microscopic: NEGATIVE
WBC, Ur, HPF, POC: NEGATIVE
Yeast, UA: NEGATIVE

## 2013-04-21 MED ORDER — METHYLPREDNISOLONE ACETATE 80 MG/ML IJ SUSP
80.0000 mg | Freq: Once | INTRAMUSCULAR | Status: AC
  Administered 2013-04-21: 80 mg via INTRAMUSCULAR

## 2013-04-21 MED ORDER — ATORVASTATIN CALCIUM 20 MG PO TABS: 20.0000 mg | ORAL_TABLET | Freq: Every day | ORAL | Status: DC

## 2013-04-21 NOTE — Progress Notes (Signed)
TOLERATED DEPO MEDROL WITHOUT DIFFICULTY

## 2013-04-21 NOTE — Progress Notes (Signed)
Patient ID: Summer Estrada, female   DOB: [birth year], 53 y.o.   MRN: [id number] SUBJECTIVE: CC: Chief Complaint  Patient presents with  . Follow-up    NEEDS REFILLS FENOFOBRIATE PRIOR AUTH OR CHANGE MED  . Back Pain    URIANLYSIS CHECKED    HPI: Here for follow up. Continues to smoke. Sees Orthopedics Dr Mardelle Matte at [facility]. Doing better after joint injection. The last time she  Received toradol here she got significant relief. However, there is  A recall on toradol.  Patient is here for follow up of hyperlipidemia: denies Headache;denies Chest Pain;denies weakness;denies Shortness of Breath and orthopnea;denies Visual changes;denies palpitations;denies cough;denies pedal edema;denies symptoms of TIA or stroke;deniesClaudication symptoms. admits to Compliance with medications; Problems with medications.: problems getting fenofibrate approved. which has worked well for her. Open to a change n medication   Past Medical History  Diagnosis Date  . Knee pain   . Chronic pain   . Wears glasses   . Wears dentures     upper  . GERD (gastroesophageal reflux disease)     occ  . Depression   . Arthritis   . Hyperlipidemia   . Anxiety    Past Surgical History  Procedure Laterality Date  . Total hip arthroplasty  2010    lt hip  . Cesarean section      x2  . Tubal ligation     History   Social History  . Marital Status: Widowed    Spouse Name: N/A    Number of Children: N/A  . Years of Education: N/A   Occupational History  . Not on file.   Social History Main Topics  . Smoking status: Current Every Day Smoker -- 1.50 packs/day  . Smokeless tobacco: Never Used  . Alcohol Use: No  . Drug Use: No  . Sexual Activity: Yes    Birth Control/ Protection: Post-menopausal   Other Topics Concern  . Not on file   Social History Narrative   WIDOW   CHILDREN; ADULT GIRLS; 3   DISABLED   Family History  Problem Relation Age of Onset  . Kidney disease Mother   .  Heart disease Father    Current Outpatient Prescriptions on File Prior to Visit  Medication Sig Dispense Refill  . diclofenac sodium (VOLTAREN) 1 % GEL Apply 2 g topically 4 (four) times daily.  2 Tube  2  . FLUoxetine (PROZAC) 40 MG capsule Take 1 capsule (40 mg total) by mouth daily.  30 capsule  5  . gabapentin (NEURONTIN) 300 MG capsule TAKE 2 CAPSULES BY MOUTH THREE TIMES DAILY  180 capsule  2  . meloxicam (MOBIC) 15 MG tablet TAKE 1 TABLET BY MOUTH EVERY DAY  30 tablet  0  . multivitamin (THERAGRAN) per tablet Take 1 tablet by mouth daily.      . ranitidine (ZANTAC) 150 MG tablet Take 150 mg by mouth daily.      Marland Kitchen VITAMIN D, CHOLECALCIFEROL, PO Take by mouth.      Marland Kitchen aspirin 81 MG tablet Take 81 mg by mouth daily.       Marland Kitchen buPROPion (WELLBUTRIN) 100 MG tablet Take 1 tablet (100 mg total) by mouth 3 (three) times daily.  30 tablet  5  . carisoprodol (SOMA) 350 MG tablet TAKE 1 TABLET BY MOUTH EVERY 8 HOURS AS NEEDED  60 tablet  3  . Cyanocobalamin (VITAMIN B 12) 100 MCG LOZG Take 1 lozenge by mouth 2 (two) times daily.      Marland Kitchen  traZODone (DESYREL) 50 MG tablet TAKE 1 TABLET BY MOUTH EVERY NIGHT AT BEDTIME  30 tablet  3   No current facility-administered medications on file prior to visit.   Allergies  Allergen Reactions  . Codeine Nausea And Vomiting and Rash    Rash all over body; severe nausea and vomiting.    There is no immunization history on file for this patient. Prior to Admission medications   Medication Sig Start Date End Date Taking? Authorizing Provider  aspirin 81 MG tablet Take 81 mg by mouth daily.     Historical Provider, MD  buPROPion (WELLBUTRIN) 100 MG tablet Take 1 tablet (100 mg total) by mouth 3 (three) times daily. 11/25/12   Vernie Shanks, MD  buPROPion (WELLBUTRIN) 100 MG tablet Take 1 tablet (100 mg total) by mouth 3 (three) times daily. 03/08/13   Lysbeth Penner, FNP  carisoprodol (SOMA) 350 MG tablet TAKE 1 TABLET BY MOUTH EVERY 8 HOURS AS NEEDED 09/01/12    Meredith Staggers, MD  Cyanocobalamin (VITAMIN B 12) 100 MCG LOZG Take 1 lozenge by mouth 2 (two) times daily.    Historical Provider, MD  diclofenac sodium (VOLTAREN) 1 % GEL Apply 2 g topically 4 (four) times daily. 07/19/12   Santiago Glad Prueter, PA-C  fenofibrate (TRICOR) 145 MG tablet TAKE 1 TABLET BY MOUTH EVERY DAY 04/18/13   Lysbeth Penner, FNP  fentaNYL (DURAGESIC) 25 MCG/HR Place 1 patch (25 mcg total) onto the skin every 3 (three) days. 10/15/12   Santiago Glad Prueter, PA-C  FLUoxetine (PROZAC) 40 MG capsule Take 1 capsule (40 mg total) by mouth daily. 11/25/12   Vernie Shanks, MD  FLUoxetine (PROZAC) 40 MG capsule TAKE ONE CAPSULE BY MOUTH DAILY 12/10/12   Chipper Herb, MD  gabapentin (NEURONTIN) 300 MG capsule TAKE 2 CAPSULES BY MOUTH THREE TIMES DAILY 11/25/12   Vernie Shanks, MD  meloxicam (MOBIC) 15 MG tablet TAKE 1 TABLET BY MOUTH EVERY DAY 04/15/13   Lysbeth Penner, FNP  multivitamin [facility]) per tablet Take 1 tablet by mouth daily.    Historical Provider, MD  oxyCODONE-acetaminophen (PERCOCET) 10-325 MG per tablet Take 1 tablet by mouth every 6 (six) hours as needed for pain. 10/15/12   Santiago Glad Prueter, PA-C  ranitidine (ZANTAC) 150 MG tablet Take 150 mg by mouth daily.    Historical Provider, MD  traZODone (DESYREL) 50 MG tablet TAKE 1 TABLET BY MOUTH EVERY NIGHT AT BEDTIME 08/19/12   Meredith Staggers, MD  TRICOR 145 MG tablet TAKE 1 TABLET BY MOUTH ONCE DAILY 12/10/12   Chipper Herb, MD  VITAMIN D, CHOLECALCIFEROL, PO Take by mouth.    Historical Provider, MD     ROS: As above in the HPI. All other systems are stable or negative.  OBJECTIVE: APPEARANCE:  Patient in no acute distress.The patient appeared well nourished and normally developed. Acyanotic. Waist: VITAL SIGNS:BP 120/75  Pulse 64  Temp(Src) 97.2 F (36.2 C) (Oral)  Ht $R'5\' 3"'Cl$  (1.6 m)  Wt 163 lb 9.6 oz (74.208 kg)  BMI 28.99 kg/m2  WF SKIN: warm and  Dry without overt rashes, tattoos and scars  HEAD and Neck: without  JVD, Head and scalp: normal Eyes:No scleral icterus. Fundi normal, eye movements normal. Ears: Auricle normal, canal normal, Tympanic membranes normal, insufflation normal. Nose: normal Throat: normal Neck & thyroid: normal  CHEST & LUNGS: Chest wall: normal Lungs: Clear  CVS: Reveals the PMI to be normally located. Regular rhythm, First and Second  Heart sounds are normal,  absence of murmurs, rubs or gallops. Peripheral vasculature: Radial pulses: normal Dorsal pedis pulses: normal Posterior pulses: normal  ABDOMEN:  Appearance: normal Benign, no organomegaly, no masses, no Abdominal Aortic enlargement. No Guarding , no rebound. No Bruits. Bowel sounds: normal  RECTAL: N/A GU: N/A  EXTREMETIES: nonedematous.  MUSCULOSKELETAL:  Spine: lumbar pain with ROM which is reduced 50% Joints: crepitus both knees. The right has soft tissue  Swelling and the right hip has reduced ROM.  NEUROLOGIC: oriented to time,place and person; nonfocal. Ambulates with a cane and limps.  ASSESSMENT:  HLD (hyperlipidemia) - Plan: CMP14+EGFR, NMR, lipoprofile, atorvastatin (LIPITOR) 20 MG tablet  Low back pain - Plan: POCT UA - Microscopic Only, POCT urinalysis dipstick, methylPREDNISolone acetate (DEPO-MEDROL) injection 80 mg  Smoker  Multinodular goiter  Chronic pain - Plan: Ambulatory referral to Orthopedic Surgery  Trochanteric bursitis of right hip  Patellar tendonitis - Plan: Ambulatory referral to Orthopedic Surgery  Osteoarthritis of both knees - Plan: Ambulatory referral to Orthopedic Surgery  GERD (gastroesophageal reflux disease)  Anxiety and depression  PLAN: Smoking cessation counselled.    Orders Placed This Encounter  Procedures  . CMP14+EGFR  . NMR, lipoprofile  . Ambulatory referral to Orthopedic Surgery    Referral Priority:  Routine    Referral Type:  Surgical    Referral Reason:  Specialty Services Required    Requested Specialty:  Orthopedic Surgery     Number of Visits Requested:  1  . POCT UA - Microscopic Only  . POCT urinalysis dipstick   Results for orders placed in visit on 04/21/13  POCT UA - MICROSCOPIC ONLY      Result Value Range   WBC, Ur, HPF, POC neg     RBC, urine, microscopic neg     Bacteria, U Microscopic neg     Mucus, UA neg     Epithelial cells, urine per micros occ     Crystals, Ur, HPF, POC neg     Casts, Ur, LPF, POC neg     Yeast, UA neg    POCT URINALYSIS DIPSTICK      Result Value Range   Color, UA yellow     Clarity, UA clear     Glucose, UA neg     Bilirubin, UA neg     Ketones, UA neg     Spec Grav, UA 1.010     Blood, UA neg     pH, UA 6.0     Protein, UA neg     Urobilinogen, UA negative     Nitrite, UA neg     Leukocytes, UA Negative      Meds ordered this encounter  Medications  . methylPREDNISolone acetate (DEPO-MEDROL) injection 80 mg    Sig:   . atorvastatin (LIPITOR) 20 MG tablet    Sig: Take 1 tablet (20 mg total) by mouth daily.    Dispense:  30 tablet    Refill:  5   Medications Discontinued During This Encounter  Medication Reason  . buPROPion (WELLBUTRIN) 562 MG tablet Duplicate  . FLUoxetine (PROZAC) 40 MG capsule Duplicate  . TRICOR 145 MG tablet Error  . fenofibrate (TRICOR) 145 MG tablet Formulary change  . fentaNYL (DURAGESIC) 25 MCG/HR Patient has not taken in last 30 days  . oxyCODONE-acetaminophen (PERCOCET) 10-325 MG per tablet Patient has not taken in last 30 days  continue present medications.  See her orthopedist.  Return in about 3 months (around 07/19/2013) for Recheck  medical problems.  Lynniah Janoski P. Jacelyn Grip, M.D.

## 2013-04-21 NOTE — Patient Instructions (Signed)
Methylprednisolone Suspension for Injection What is this medicine? METHYLPREDNISOLONE (meth ill pred NISS oh lone) is a corticosteroid. It is commonly used to treat inflammation of the skin, joints, lungs, and other organs. Common conditions treated include asthma, allergies, and arthritis. It is also used for other conditions, such as blood disorders and diseases of the adrenal glands. This medicine may be used for other purposes; ask your health care provider or pharmacist if you have questions. COMMON BRAND NAME(S): Depmedalone-40, Depmedalone-80 , Depo-Medrol What should I tell my health care provider before I take this medicine? They need to know if you have any of these conditions: -cataracts or glaucoma -Cushings -heart disease -high blood pressure -infection including tuberculosis -low calcium or potassium levels in the blood -recent surgery -seizures -stomach or intestinal disease, including colitis -threadworms -thyroid problems -an unusual or allergic reaction to methylprednisolone, corticosteroids, benzyl alcohol, other medicines, foods, dyes, or preservatives -pregnant or trying to get pregnant -breast-feeding How should I use this medicine? This medicine is for injection into a muscle, joint, or other tissue. It is given by a health care professional in a hospital or clinic setting. Talk to your pediatrician regarding the use of this medicine in children. While this drug may be prescribed for selected conditions, precautions do apply. Overdosage: If you think you have taken too much of this medicine contact a poison control center or emergency room at once. NOTE: This medicine is only for you. Do not share this medicine with others. What if I miss a dose? This does not apply. What may interact with this medicine? Do not take this medicine with any of the following medications: -mifepristone This medicine may also interact with the following medications: -aspirin and  aspirin-like medicines -cyclosporin -ketoconazole -phenobarbital -phenytoin -rifampin -tacrolimus -troleandomycin -vaccines -warfarin This list may not describe all possible interactions. Give your health care provider a list of all the medicines, herbs, non-prescription drugs, or dietary supplements you use. Also tell them if you smoke, drink alcohol, or use illegal drugs. Some items may interact with your medicine. What should I watch for while using this medicine? Visit your doctor or health care professional for regular checks on your progress. If you are taking this medicine for a long time, carry an identification card with your name and address, the type and dose of your medicine, and your doctor's name and address. The medicine may increase your risk of getting an infection. Stay away from people who are sick. Tell your doctor or health care professional if you are around anyone with measles or chickenpox. You may need to avoid some vaccines. Talk to your health care provider for more information. If you are going to have surgery, tell your doctor or health care professional that you have taken this medicine within the last twelve months. Ask your doctor or health care professional about your diet. You may need to lower the amount of salt you eat. The medicine can increase your blood sugar. If you are a diabetic check with your doctor if you need help adjusting the dose of your diabetic medicine. What side effects may I notice from receiving this medicine? Side effects that you should report to your doctor or health care professional as soon as possible: -allergic reactions like skin rash, itching or hives, swelling of the face, lips, or tongue -bloody or tarry stools -changes in vision -eye pain or bulging eyes -fever, sore throat, sneezing, cough, or other signs of infection, wounds that will not heal -increased thirst -irregular   heartbeat -muscle cramps -pain in hips, back,  ribs, arms, shoulders, or legs -swelling of the ankles, feet, hands -trouble passing urine or change in the amount of urine -unusual bleeding or bruising -unusually weak or tired -weight gain or weight loss Side effects that usually do not require medical attention (report to your doctor or health care professional if they continue or are bothersome): -changes in emotions or moods -constipation or diarrhea -headache -irritation at site where injected -nausea, vomiting -skin problems, acne, thin and shiny skin -trouble sleeping -unusual hair growth on the face or body This list may not describe all possible side effects. Call your doctor for medical advice about side effects. You may report side effects to FDA at 1-800-FDA-1088. Where should I keep my medicine? This drug is given in a hospital or clinic and will not be stored at home. NOTE: This sheet is a summary. It may not cover all possible information. If you have questions about this medicine, talk to your doctor, pharmacist, or health care provider.  2014, Elsevier/Gold Standard. (2011-12-02 11:37:56)  

## 2013-04-22 ENCOUNTER — Other Ambulatory Visit: Payer: Medicaid Other

## 2013-04-24 LAB — NMR, LIPOPROFILE
Cholesterol: 214 mg/dL — ABNORMAL HIGH (ref <200)
HDL Cholesterol by NMR: 69 mg/dL (ref >=40)
HDL Particle Number: 48.3 umol/L (ref >=30.5)
LDL Particle Number: 1628 nmol/L — ABNORMAL HIGH (ref <1000)
LDL Size: 21.2 nm (ref >20.5)
LDLC SERPL CALC-MCNC: 120 mg/dL — ABNORMAL HIGH (ref <100)
LP-IR Score: 64 — ABNORMAL HIGH (ref <=45)
Small LDL Particle Number: 677 nmol/L — ABNORMAL HIGH (ref <=527)
Triglycerides by NMR: 125 mg/dL (ref <150)

## 2013-04-24 LAB — CMP14+EGFR
ALT: 15 IU/L (ref 0–32)
AST: 15 IU/L (ref 0–40)
Albumin/Globulin Ratio: 2.3 (ref 1.1–2.5)
Albumin: 4.5 g/dL (ref 3.5–5.5)
Alkaline Phosphatase: 45 IU/L (ref 39–117)
BUN/Creatinine Ratio: 21 (ref 9–23)
BUN: 17 mg/dL (ref 6–24)
CO2: 28 mmol/L (ref 18–29)
Calcium: 9.9 mg/dL (ref 8.7–10.2)
Chloride: 99 mmol/L (ref 97–108)
Creatinine, Ser: 0.8 mg/dL (ref 0.57–1.00)
GFR calc Af Amer: 97 mL/min/{1.73_m2} (ref >59)
GFR calc non Af Amer: 84 mL/min/{1.73_m2} (ref >59)
Globulin, Total: 2 g/dL (ref 1.5–4.5)
Glucose: 70 mg/dL (ref 65–99)
Potassium: 4.4 mmol/L (ref 3.5–5.2)
Sodium: 143 mmol/L (ref 134–144)
Total Bilirubin: 0.2 mg/dL (ref 0.0–1.2)
Total Protein: 6.5 g/dL (ref 6.0–8.5)

## 2013-04-25 ENCOUNTER — Other Ambulatory Visit: Payer: Self-pay

## 2013-04-25 NOTE — Telephone Encounter (Signed)
Patient seen 04/21/13 FPW  Last lipid 04/22/13  This med not on EPIC list pharmacy faxing over

## 2013-04-25 NOTE — Telephone Encounter (Signed)
Refill denied.because it was discontinued and switched to atorvastatin. Bring all medications at next office visit.

## 2013-05-06 ENCOUNTER — Telehealth: Payer: Self-pay | Admitting: *Deleted

## 2013-05-06 NOTE — Telephone Encounter (Signed)
Dr. Modesto CharonWong, [place] [facility] will not cover fenofibrate without pt failing generic lopid is that a possibility for this pt?  Thanks

## 2013-05-06 NOTE — Telephone Encounter (Signed)
NO. Because it will incraese the risk higher for rhabdomyolysis with the atorvastatin. Fenofibrate can but less so. Thanks. Elwyn Klosinski P. Modesto CharonWong, M.D.

## 2013-05-13 ENCOUNTER — Other Ambulatory Visit: Payer: Self-pay | Admitting: Family Medicine

## 2013-05-18 ENCOUNTER — Other Ambulatory Visit: Payer: Self-pay

## 2013-05-18 DIAGNOSIS — F32A Depression, unspecified: Secondary | ICD-10-CM

## 2013-05-18 DIAGNOSIS — F329 Major depressive disorder, single episode, unspecified: Secondary | ICD-10-CM

## 2013-05-18 MED ORDER — BUPROPION HCL 100 MG PO TABS: 100.0000 mg | ORAL_TABLET | Freq: Three times a day (TID) | ORAL | Status: DC

## 2013-06-10 ENCOUNTER — Other Ambulatory Visit: Payer: Self-pay | Admitting: Family Medicine

## 2013-06-14 ENCOUNTER — Other Ambulatory Visit: Payer: Self-pay | Admitting: Family Medicine

## 2013-07-06 NOTE — Telephone Encounter (Signed)
covered

## 2013-07-13 ENCOUNTER — Other Ambulatory Visit: Payer: Self-pay | Admitting: Family Medicine

## 2013-08-17 ENCOUNTER — Ambulatory Visit: Payer: Medicaid Other | Admitting: Family

## 2013-09-24 ENCOUNTER — Other Ambulatory Visit: Payer: Self-pay | Admitting: Nurse Practitioner

## 2013-09-24 MED ORDER — BUPROPION HCL 100 MG PO TABS: ORAL_TABLET | ORAL | Status: DC

## 2013-10-13 ENCOUNTER — Other Ambulatory Visit: Payer: Self-pay | Admitting: *Deleted

## 2013-10-13 MED ORDER — MELOXICAM 15 MG PO TABS: ORAL_TABLET | ORAL | Status: DC

## 2013-11-07 ENCOUNTER — Other Ambulatory Visit: Payer: Self-pay | Admitting: Family Medicine

## 2013-11-08 ENCOUNTER — Other Ambulatory Visit: Payer: Self-pay | Admitting: *Deleted

## 2013-11-08 DIAGNOSIS — F329 Major depressive disorder, single episode, unspecified: Secondary | ICD-10-CM

## 2013-11-08 DIAGNOSIS — F32A Depression, unspecified: Secondary | ICD-10-CM

## 2013-11-08 MED ORDER — FLUOXETINE HCL 40 MG PO CAPS: 40.0000 mg | ORAL_CAPSULE | Freq: Every day | ORAL | Status: DC

## 2013-11-09 ENCOUNTER — Other Ambulatory Visit: Payer: Self-pay | Admitting: *Deleted

## 2013-11-09 MED ORDER — GABAPENTIN 300 MG PO CAPS: ORAL_CAPSULE | ORAL | Status: DC

## 2013-11-17 ENCOUNTER — Ambulatory Visit (INDEPENDENT_AMBULATORY_CARE_PROVIDER_SITE_OTHER): Payer: Medicaid Other | Admitting: Family

## 2013-11-17 ENCOUNTER — Encounter: Payer: Self-pay | Admitting: Family

## 2013-11-17 VITALS — BP 107/80 | HR 62 | Temp 96.9°F | Ht 63.0 in | Wt 176.8 lb

## 2013-11-17 DIAGNOSIS — M25569 Pain in unspecified knee: Secondary | ICD-10-CM

## 2013-11-17 DIAGNOSIS — K219 Gastro-esophageal reflux disease without esophagitis: Secondary | ICD-10-CM

## 2013-11-17 DIAGNOSIS — F32A Depression, unspecified: Secondary | ICD-10-CM

## 2013-11-17 DIAGNOSIS — E785 Hyperlipidemia, unspecified: Secondary | ICD-10-CM

## 2013-11-17 DIAGNOSIS — G8929 Other chronic pain: Secondary | ICD-10-CM

## 2013-11-17 DIAGNOSIS — F341 Dysthymic disorder: Secondary | ICD-10-CM

## 2013-11-17 DIAGNOSIS — M25562 Pain in left knee: Secondary | ICD-10-CM

## 2013-11-17 DIAGNOSIS — G4700 Insomnia, unspecified: Secondary | ICD-10-CM

## 2013-11-17 DIAGNOSIS — F419 Anxiety disorder, unspecified: Secondary | ICD-10-CM

## 2013-11-17 DIAGNOSIS — M545 Low back pain, unspecified: Secondary | ICD-10-CM

## 2013-11-17 DIAGNOSIS — Z1321 Encounter for screening for nutritional disorder: Secondary | ICD-10-CM

## 2013-11-17 DIAGNOSIS — M25561 Pain in right knee: Secondary | ICD-10-CM

## 2013-11-17 DIAGNOSIS — F329 Major depressive disorder, single episode, unspecified: Secondary | ICD-10-CM

## 2013-11-17 DIAGNOSIS — F3289 Other specified depressive episodes: Secondary | ICD-10-CM

## 2013-11-17 MED ORDER — TRAZODONE HCL 50 MG PO TABS: ORAL_TABLET | ORAL | Status: DC

## 2013-11-17 MED ORDER — CARISOPRODOL 350 MG PO TABS: ORAL_TABLET | ORAL | Status: DC

## 2013-11-17 MED ORDER — BUPROPION HCL 100 MG PO TABS: 100.0000 mg | ORAL_TABLET | Freq: Three times a day (TID) | ORAL | Status: DC

## 2013-11-17 MED ORDER — GABAPENTIN 300 MG PO CAPS: ORAL_CAPSULE | ORAL | Status: DC

## 2013-11-17 MED ORDER — FLUOXETINE HCL 40 MG PO CAPS: 40.0000 mg | ORAL_CAPSULE | Freq: Every day | ORAL | Status: DC

## 2013-11-17 NOTE — Patient Instructions (Signed)

## 2013-11-17 NOTE — Progress Notes (Signed)
Subjective:    Patient ID: Summer Estrada, female    DOB: [birth year], 53 y.o.   MRN: [id number]  Hyperlipidemia This is a chronic problem. The current episode started more than 1 year ago. The problem is controlled. Recent lipid tests were reviewed and are normal. She has no history of diabetes or hypothyroidism. Factors aggravating her hyperlipidemia include smoking. Pertinent negatives include no leg pain, myalgias or shortness of breath. Current antihyperlipidemic treatment includes statins. The current treatment provides moderate improvement of lipids. Risk factors for coronary artery disease include dyslipidemia and post-menopausal.  Anxiety Presents for follow-up visit. Symptoms include depressed mood and insomnia. Patient reports no excessive worry, nervous/anxious behavior, palpitations, restlessness or shortness of breath. Symptoms occur occasionally. The quality of sleep is poor. Nighttime awakenings: several.   Her past medical history is significant for anxiety/panic attacks and depression. Past treatments include SSRIs. Compliance with prior treatments has been good.  Gastrophageal Reflux She reports no belching, no coughing, no heartburn or no sore throat. This is a chronic problem. The current episode started more than 1 year ago. The problem occurs rarely. The problem has been waxing and waning. The symptoms are aggravated by certain foods. Pertinent negatives include no fatigue or muscle weakness. She has tried a PPI for the symptoms. The treatment provided significant relief.      Review of Systems  Constitutional: Negative.  Negative for fatigue.  HENT: Negative.  Negative for sore throat.   Eyes: Negative.   Respiratory: Negative.  Negative for cough and shortness of breath.   Cardiovascular: Negative.  Negative for palpitations.  Gastrointestinal: Negative.  Negative for heartburn.  Endocrine: Negative.   Genitourinary: Negative.   Musculoskeletal: Negative.   Negative for muscle weakness and myalgias.  Neurological: Negative.  Negative for headaches.  Hematological: Negative.   Psychiatric/Behavioral: The patient has insomnia. The patient is not nervous/anxious.   All other systems reviewed and are negative.      Objective:   Physical Exam  Vitals reviewed. Constitutional: She is oriented to person, place, and time. She appears well-developed and well-nourished. No distress.  HENT:  Head: Normocephalic and atraumatic.  Right Ear: External ear normal.  Mouth/Throat: Oropharynx is clear and moist.  Eyes: Pupils are equal, round, and reactive to light.  Neck: Normal range of motion. Neck supple. No thyromegaly present.  Cardiovascular: Normal rate, regular rhythm, normal heart sounds and intact distal pulses.   No murmur heard. Pulmonary/Chest: Effort normal and breath sounds normal. No respiratory distress. She has no wheezes.  Abdominal: Soft. Bowel sounds are normal. She exhibits no distension. There is no tenderness.  Musculoskeletal: Normal range of motion. She exhibits no edema and no tenderness.  Neurological: She is alert and oriented to person, place, and time. She has normal reflexes. No cranial nerve deficit.  Skin: Skin is warm and dry.  Psychiatric: She has a normal mood and affect. Her behavior is normal. Judgment and thought content normal.     BP 107/80  Pulse 62  Temp(Src) 96.9 F (36.1 C) (Oral)  Ht $R'5\' 3"'Rb$  (1.6 m)  Wt 176 lb 12.8 oz (80.196 kg)  BMI 31.33 kg/m2      Assessment & Plan:  1. Gastroesophageal reflux disease without esophagitis - CMP14+EGFR  2. Anxiety and depression - CMP14+EGFR - buPROPion (WELLBUTRIN) 100 MG tablet; Take 1 tablet (100 mg total) by mouth 3 (three) times daily.  Dispense: 90 tablet; Refill: 2 - FLUoxetine (PROZAC) 40 MG capsule; Take 1 capsule (40 mg total)  by mouth daily.  Dispense: 90 capsule; Refill: 1  3. Insomnia - CMP14+EGFR - traZODone (DESYREL) 50 MG tablet; TAKE 1  TABLET BY MOUTH EVERY NIGHT AT BEDTIME  Dispense: 30 tablet; Refill: 6  4. Depression  5. Hyperlipidemia - Lipid panel  6. Encounter for vitamin deficiency screening - Vit D  25 hydroxy (rtn osteoporosis monitoring)  7. Low back pain without sciatica, unspecified back pain laterality - Ambulatory referral to Orthopedic Surgery - carisoprodol (SOMA) 350 MG tablet; TAKE 1 TABLET BY MOUTH EVERY 8 HOURS AS NEEDED  Dispense: 60 tablet; Refill: 3  8. Arthralgia of both knees - Ambulatory referral to Orthopedic Surgery - carisoprodol (SOMA) 350 MG tablet; TAKE 1 TABLET BY MOUTH EVERY 8 HOURS AS NEEDED  Dispense: 60 tablet; Refill: 3  9. Chronic pain  - Ambulatory referral to Orthopedic Surgery - carisoprodol (SOMA) 350 MG tablet; TAKE 1 TABLET BY MOUTH EVERY 8 HOURS AS NEEDED  Dispense: 60 tablet; Refill: 3   Continue all meds Labs pending Health Maintenance reviewed-hemoccult cards given to patient with directions Diet and exercise encouraged RTO 6 months  Evelina Dun, FNP

## 2013-11-18 LAB — CMP14+EGFR
A/G RATIO: 2.2 (ref 1.1–2.5)
ALT: 16 IU/L (ref 0–32)
AST: 18 IU/L (ref 0–40)
Albumin: 4.4 g/dL (ref 3.5–5.5)
Alkaline Phosphatase: 40 IU/L (ref 39–117)
BUN / CREAT RATIO: 14 (ref 9–23)
BUN: 13 mg/dL (ref 6–24)
CALCIUM: 9.4 mg/dL (ref 8.7–10.2)
CO2: 25 mmol/L (ref 18–29)
CREATININE: 0.92 mg/dL (ref 0.57–1.00)
Chloride: 100 mmol/L (ref 97–108)
GFR calc Af Amer: 82 mL/min/{1.73_m2} (ref >59)
GFR, EST NON AFRICAN AMERICAN: 71 mL/min/{1.73_m2} (ref >59)
GLOBULIN, TOTAL: 2 g/dL (ref 1.5–4.5)
Glucose: 79 mg/dL (ref 65–99)
Potassium: 5 mmol/L (ref 3.5–5.2)
Sodium: 141 mmol/L (ref 134–144)
Total Bilirubin: 0.3 mg/dL (ref 0.0–1.2)
Total Protein: 6.4 g/dL (ref 6.0–8.5)

## 2013-11-18 LAB — LIPID PANEL
CHOL/HDL RATIO: 2.2 ratio (ref 0.0–4.4)
Cholesterol, Total: 136 mg/dL (ref 100–199)
HDL: 61 mg/dL (ref >39)
LDL CALC: 61 mg/dL (ref 0–99)
Triglycerides: 69 mg/dL (ref 0–149)
VLDL Cholesterol Cal: 14 mg/dL (ref 5–40)

## 2013-11-18 LAB — VITAMIN D 25 HYDROXY (VIT D DEFICIENCY, FRACTURES): Vit D, 25-Hydroxy: 37.7 ng/mL (ref 30.0–100.0)

## 2013-11-22 ENCOUNTER — Other Ambulatory Visit: Payer: Self-pay | Admitting: Family Medicine

## 2013-11-22 ENCOUNTER — Telehealth: Payer: Self-pay | Admitting: Family

## 2013-11-22 ENCOUNTER — Telehealth: Payer: Self-pay | Admitting: Family Medicine

## 2013-11-22 NOTE — Telephone Encounter (Signed)
Patient aware.

## 2013-11-22 NOTE — Telephone Encounter (Signed)
Message copied by Azalee Course on Tue Nov 22, 2013 10:36 AM ------      Message from: [place], [place] A      Created: Tue Nov 22, 2013  8:55 AM       Kidney and liver function stable      Cholesterol levels WNL      Vit D levels on low side of normal- Would benefit from Vit D OTC ------

## 2013-11-28 NOTE — Telephone Encounter (Signed)
Message copied by Doreatha Massed on Mon Nov 28, 2013  2:32 PM ------      Message from: [place], [place] A      Created: Tue Nov 22, 2013  8:55 AM       Kidney and liver function stable      Cholesterol levels WNL      Vit D levels on low side of normal- Would benefit from Vit D OTC ------

## 2013-12-07 NOTE — Telephone Encounter (Signed)
A FOBT card was mailed to patient

## 2014-01-12 ENCOUNTER — Other Ambulatory Visit: Payer: Self-pay | Admitting: *Deleted

## 2014-01-12 DIAGNOSIS — E785 Hyperlipidemia, unspecified: Secondary | ICD-10-CM

## 2014-01-12 MED ORDER — ATORVASTATIN CALCIUM 20 MG PO TABS
20.0000 mg | ORAL_TABLET | Freq: Every day | ORAL | Status: DC
Start: 2014-01-12 — End: 2014-10-17

## 2014-02-07 ENCOUNTER — Other Ambulatory Visit: Payer: Self-pay | Admitting: Family

## 2014-03-06 ENCOUNTER — Other Ambulatory Visit: Payer: Self-pay | Admitting: Family

## 2014-03-06 NOTE — Telephone Encounter (Signed)
rx ready for pickup 

## 2014-03-06 NOTE — Telephone Encounter (Signed)
Last seen 11/17/13 [name]  If approved route to nurse to call into [facility] [phone]

## 2014-03-07 NOTE — Telephone Encounter (Signed)
Pt notified RX is ready for pick up 

## 2014-03-08 ENCOUNTER — Other Ambulatory Visit: Payer: Self-pay | Admitting: Family

## 2014-04-20 ENCOUNTER — Other Ambulatory Visit: Payer: Self-pay | Admitting: Family

## 2014-04-21 NOTE — Telephone Encounter (Signed)
Last seen 11/17/13, last filled 03/06/14. Rx will print

## 2014-05-14 ENCOUNTER — Other Ambulatory Visit: Payer: Self-pay | Admitting: Family

## 2014-05-15 ENCOUNTER — Other Ambulatory Visit: Payer: Self-pay

## 2014-05-15 DIAGNOSIS — F329 Major depressive disorder, single episode, unspecified: Secondary | ICD-10-CM

## 2014-05-15 DIAGNOSIS — F419 Anxiety disorder, unspecified: Principal | ICD-10-CM

## 2014-05-15 DIAGNOSIS — F32A Depression, unspecified: Secondary | ICD-10-CM

## 2014-05-15 MED ORDER — FLUOXETINE HCL 40 MG PO CAPS: 40.0000 mg | ORAL_CAPSULE | Freq: Every day | ORAL | Status: DC

## 2014-05-15 MED ORDER — BUPROPION HCL 100 MG PO TABS: 100.0000 mg | ORAL_TABLET | Freq: Three times a day (TID) | ORAL | Status: DC

## 2014-06-15 ENCOUNTER — Other Ambulatory Visit: Payer: Self-pay | Admitting: Family Medicine

## 2014-06-29 DIAGNOSIS — W540XXA Bitten by dog, initial encounter: Secondary | ICD-10-CM | POA: Diagnosis not present

## 2014-06-29 DIAGNOSIS — S51831A Puncture wound without foreign body of right forearm, initial encounter: Secondary | ICD-10-CM | POA: Diagnosis not present

## 2014-06-29 DIAGNOSIS — S51811A Laceration without foreign body of right forearm, initial encounter: Secondary | ICD-10-CM | POA: Diagnosis not present

## 2014-07-11 ENCOUNTER — Telehealth: Payer: Self-pay | Admitting: Family

## 2014-07-11 ENCOUNTER — Other Ambulatory Visit: Payer: Self-pay | Admitting: Family

## 2014-07-11 MED ORDER — CARISOPRODOL 350 MG PO TABS
350.0000 mg | ORAL_TABLET | Freq: Three times a day (TID) | ORAL | Status: DC | PRN
Start: 2014-07-11 — End: 2014-10-09

## 2014-07-11 NOTE — Telephone Encounter (Signed)
Patient is going to follow up with the provider who put the stitches in since they recommended she follow up with a plastic surgeon.

## 2014-07-11 NOTE — Telephone Encounter (Signed)
NA at [phone]#, message says [phone]# has been changed or disconnected

## 2014-07-11 NOTE — Telephone Encounter (Signed)
RX ready for pick up 

## 2014-07-13 NOTE — Telephone Encounter (Signed)
Called into [facility] per patient request. Printed prescription shredded.

## 2014-07-20 ENCOUNTER — Other Ambulatory Visit: Payer: Self-pay | Admitting: Family

## 2014-07-25 ENCOUNTER — Ambulatory Visit: Payer: Medicaid Other | Admitting: Family Medicine

## 2014-08-17 ENCOUNTER — Ambulatory Visit: Payer: Medicaid Other | Admitting: Family

## 2014-08-18 ENCOUNTER — Ambulatory Visit (INDEPENDENT_AMBULATORY_CARE_PROVIDER_SITE_OTHER): Payer: Medicare Other | Admitting: Physician Assistant

## 2014-08-18 ENCOUNTER — Encounter: Payer: Self-pay | Admitting: Physician Assistant

## 2014-08-18 VITALS — BP 89/60 | HR 60 | Temp 97.0°F | Ht 63.0 in | Wt 167.0 lb

## 2014-08-18 DIAGNOSIS — F17200 Nicotine dependence, unspecified, uncomplicated: Secondary | ICD-10-CM

## 2014-08-18 MED ORDER — VARENICLINE TARTRATE 0.5 MG PO TABS: 0.5000 mg | ORAL_TABLET | Freq: Two times a day (BID) | ORAL | Status: DC

## 2014-08-18 NOTE — Patient Instructions (Signed)
Smoking Cessation Quitting smoking is important to your health and has many advantages. However, it is not always easy to quit since nicotine is a very addictive drug. Oftentimes, people try 3 times or more before being able to quit. This document explains the best ways for you to prepare to quit smoking. Quitting takes hard work and a lot of effort, but you can do it. ADVANTAGES OF QUITTING SMOKING  You will live longer, feel better, and live better.  Your body will feel the impact of quitting smoking almost immediately.  Within 20 minutes, blood pressure decreases. Your pulse returns to its normal level.  After 8 hours, carbon monoxide levels in the blood return to normal. Your oxygen level increases.  After 24 hours, the chance of having a heart attack starts to decrease. Your breath, hair, and body stop smelling like smoke.  After 48 hours, damaged nerve endings begin to recover. Your sense of taste and smell improve.  After 72 hours, the body is virtually free of nicotine. Your bronchial tubes relax and breathing becomes easier.  After 2 to 12 weeks, lungs can hold more air. Exercise becomes easier and circulation improves.  The risk of having a heart attack, stroke, cancer, or lung disease is greatly reduced.  After 1 year, the risk of coronary heart disease is cut in half.  After 5 years, the risk of stroke falls to the same as a nonsmoker.  After 10 years, the risk of lung cancer is cut in half and the risk of other cancers decreases significantly.  After 15 years, the risk of coronary heart disease drops, usually to the level of a nonsmoker.  If you are pregnant, quitting smoking will improve your chances of having a healthy baby.  The people you live with, especially any children, will be healthier.  You will have extra money to spend on things other than cigarettes. QUESTIONS TO THINK ABOUT BEFORE ATTEMPTING TO QUIT You may want to talk about your answers with your  health care provider.  Why do you want to quit?  If you tried to quit in the past, what helped and what did not?  What will be the most difficult situations for you after you quit? How will you plan to handle them?  Who can help you through the tough times? Your family? Friends? A health care provider?  What pleasures do you get from smoking? What ways can you still get pleasure if you quit? Here are some questions to ask your health care provider:  How can you help me to be successful at quitting?  What medicine do you think would be best for me and how should I take it?  What should I do if I need more help?  What is smoking withdrawal like? How can I get information on withdrawal? GET READY  Set a quit date.  Change your environment by getting rid of all cigarettes, ashtrays, matches, and lighters in your home, car, or work. Do not let people smoke in your home.  Review your past attempts to quit. Think about what worked and what did not. GET SUPPORT AND ENCOURAGEMENT You have a better chance of being successful if you have help. You can get support in many ways.  Tell your family, friends, and coworkers that you are going to quit and need their support. Ask them not to smoke around you.  Get individual, group, or telephone counseling and support. Programs are available at local hospitals and health centers. Call   your local health department for information about programs in your area.  Spiritual beliefs and practices may help some smokers quit.  Download a "quit meter" on your computer to keep track of quit statistics, such as how long you have gone without smoking, cigarettes not smoked, and money saved.  Get a self-help book about quitting smoking and staying off tobacco. LEARN NEW SKILLS AND BEHAVIORS  Distract yourself from urges to smoke. Talk to someone, go for a walk, or occupy your time with a task.  Change your normal routine. Take a different route to work.  Drink tea instead of coffee. Eat breakfast in a different place.  Reduce your stress. Take a hot bath, exercise, or read a book.  Plan something enjoyable to do every day. Reward yourself for not smoking.  Explore interactive web-based programs that specialize in helping you quit. GET MEDICINE AND USE IT CORRECTLY Medicines can help you stop smoking and decrease the urge to smoke. Combining medicine with the above behavioral methods and support can greatly increase your chances of successfully quitting smoking.  Nicotine replacement therapy helps deliver nicotine to your body without the negative effects and risks of smoking. Nicotine replacement therapy includes nicotine gum, lozenges, inhalers, nasal sprays, and skin patches. Some may be available over-the-counter and others require a prescription.  Antidepressant medicine helps people abstain from smoking, but how this works is unknown. This medicine is available by prescription.  Nicotinic receptor partial agonist medicine simulates the effect of nicotine in your brain. This medicine is available by prescription. Ask your health care provider for advice about which medicines to use and how to use them based on your health history. Your health care provider will tell you what side effects to look out for if you choose to be on a medicine or therapy. Carefully read the information on the package. Do not use any other product containing nicotine while using a nicotine replacement product.  RELAPSE OR DIFFICULT SITUATIONS Most relapses occur within the first 3 months after quitting. Do not be discouraged if you start smoking again. Remember, most people try several times before finally quitting. You may have symptoms of withdrawal because your body is used to nicotine. You may crave cigarettes, be irritable, feel very hungry, cough often, get headaches, or have difficulty concentrating. The withdrawal symptoms are only temporary. They are strongest  when you first quit, but they will go away within 10-14 days. To reduce the chances of relapse, try to:  Avoid drinking alcohol. Drinking lowers your chances of successfully quitting.  Reduce the amount of caffeine you consume. Once you quit smoking, the amount of caffeine in your body increases and can give you symptoms, such as a rapid heartbeat, sweating, and anxiety.  Avoid smokers because they can make you want to smoke.  Do not let weight gain distract you. Many smokers will gain weight when they quit, usually less than 10 pounds. Eat a healthy diet and stay active. You can always lose the weight gained after you quit.  Find ways to improve your mood other than smoking. FOR MORE INFORMATION  www.smokefree.gov  Document Released: 02/25/2001 Document Revised: 07/18/2013 Document Reviewed: 06/12/2011 [employer] Patient Information 2015 [employer]. This information is not intended to replace advice given to you by your health care provider. Make sure you discuss any questions you have with your health care provider.  

## 2014-08-18 NOTE — Progress Notes (Signed)
Subjective:     Patient ID: Summer Estrada, female   DOB: 08/21/1960, 54 y.o.   MRN: [id number]  HPI Pt here for smoking cessation She has been a heavy smoker for 30+ yrs Pt would like to try to quit for health reasons There is another smoker in the house No prev attempts to quit  Review of Systems     Objective:   Physical Exam Defer Spent 20 min face to face with pt reviewing SE of meds and quitting strategies    Assessment:     Nicotine dependence, uncomplicated, unspecified nicotine product type - Plan: Ambulatory referral to Orthopedic Surgery      Plan:     Chantix rx for starter pack today Pt also instructed to get in contact with [facility] Pt to f/u in 1 month

## 2014-08-23 ENCOUNTER — Other Ambulatory Visit: Payer: Self-pay | Admitting: *Deleted

## 2014-08-23 DIAGNOSIS — G4700 Insomnia, unspecified: Secondary | ICD-10-CM

## 2014-08-23 MED ORDER — GABAPENTIN 300 MG PO CAPS: 600.0000 mg | ORAL_CAPSULE | Freq: Three times a day (TID) | ORAL | Status: DC

## 2014-08-23 MED ORDER — TRAZODONE HCL 50 MG PO TABS: ORAL_TABLET | ORAL | Status: DC

## 2014-08-23 MED ORDER — BUPROPION HCL 100 MG PO TABS: 100.0000 mg | ORAL_TABLET | Freq: Three times a day (TID) | ORAL | Status: DC

## 2014-08-23 NOTE — Telephone Encounter (Signed)
[facility] approved and ask me to put in

## 2014-08-28 DIAGNOSIS — M542 Cervicalgia: Secondary | ICD-10-CM | POA: Diagnosis not present

## 2014-08-29 ENCOUNTER — Telehealth: Payer: Self-pay | Admitting: Physician Assistant

## 2014-08-29 DIAGNOSIS — M1990 Unspecified osteoarthritis, unspecified site: Secondary | ICD-10-CM

## 2014-08-29 DIAGNOSIS — M170 Bilateral primary osteoarthritis of knee: Secondary | ICD-10-CM

## 2014-08-29 NOTE — Telephone Encounter (Signed)
Please advise 

## 2014-09-04 DIAGNOSIS — M542 Cervicalgia: Secondary | ICD-10-CM | POA: Diagnosis not present

## 2014-09-09 ENCOUNTER — Other Ambulatory Visit: Payer: Self-pay | Admitting: Family

## 2014-09-11 DIAGNOSIS — M542 Cervicalgia: Secondary | ICD-10-CM | POA: Diagnosis not present

## 2014-09-11 NOTE — Telephone Encounter (Signed)
Last seen 08/18/14 [facility]

## 2014-09-26 ENCOUNTER — Telehealth: Payer: Self-pay | Admitting: Family Medicine

## 2014-09-26 NOTE — Telephone Encounter (Signed)
NA, notes say that information has been sent to Dr. Mosetta Pigeon'Tooles office

## 2014-10-04 ENCOUNTER — Other Ambulatory Visit: Payer: Self-pay | Admitting: Neurosurgery

## 2014-10-04 DIAGNOSIS — M4802 Spinal stenosis, cervical region: Secondary | ICD-10-CM | POA: Diagnosis not present

## 2014-10-09 ENCOUNTER — Other Ambulatory Visit: Payer: Self-pay | Admitting: Family

## 2014-10-10 ENCOUNTER — Other Ambulatory Visit: Payer: Self-pay | Admitting: Family

## 2014-10-10 NOTE — Telephone Encounter (Signed)
Last seen by you on 11/2013? Send response to pool

## 2014-10-11 NOTE — Telephone Encounter (Signed)
Patient aware that rx is ready to be picked up.  

## 2014-10-11 NOTE — Telephone Encounter (Signed)
Na/ww 7/27

## 2014-10-17 ENCOUNTER — Other Ambulatory Visit: Payer: Self-pay | Admitting: Family

## 2014-10-18 ENCOUNTER — Other Ambulatory Visit: Payer: Self-pay | Admitting: Family Medicine

## 2014-10-18 NOTE — Progress Notes (Signed)
A call was placed to the MD's office at 11:04 AM today requesting an alternate contact number. According to [facility], operator, " We have the same telephone numbers that you have." Attempts were made to contact pt at both numbers listed; unable to leave pre-op instructions because pt does not have a voice mailbox on either phone.

## 2014-10-18 NOTE — Progress Notes (Signed)
Spoke with Dr. Franky Macho ( on call for Dr. Gerlene Fee) regarding the unsuccessful attempts to contact pt and leave pre-op instructions. Dr. Franky Macho to make MD aware; no new orders given.

## 2014-10-18 NOTE — Progress Notes (Addendum)
Left voice message with Meade Maw, at 4:40 P.M.( operator ) at MD's office, to tell Erie Noe, CMA to make MD aware that many unsuccessful attempts have been made to contact pt, suggested that pt be moved from first case and left my telephone number for follow-up.

## 2014-10-19 ENCOUNTER — Encounter (HOSPITAL_COMMUNITY): Payer: Self-pay | Admitting: Certified Registered Nurse Anesthetist

## 2014-10-19 ENCOUNTER — Inpatient Hospital Stay (HOSPITAL_COMMUNITY): Admission: RE | Admit: 2014-10-19 | Payer: Medicaid Other | Source: Ambulatory Visit | Admitting: Neurosurgery

## 2014-10-19 ENCOUNTER — Encounter (HOSPITAL_COMMUNITY): Admission: RE | Payer: Self-pay | Source: Ambulatory Visit

## 2014-10-19 SURGERY — ANTERIOR CERVICAL DECOMPRESSION/DISCECTOMY FUSION 2 LEVELS
Anesthesia: General

## 2014-10-19 MED ORDER — CEFAZOLIN SODIUM-DEXTROSE 2-3 GM-% IV SOLR
INTRAVENOUS | Status: AC
  Filled 2014-10-19: qty 50

## 2014-10-19 MED ORDER — FENTANYL CITRATE (PF) 250 MCG/5ML IJ SOLN
INTRAMUSCULAR | Status: AC
  Filled 2014-10-19: qty 5

## 2014-10-19 MED ORDER — MIDAZOLAM HCL 2 MG/2ML IJ SOLN
INTRAMUSCULAR | Status: AC
  Filled 2014-10-19: qty 4

## 2014-10-19 MED ORDER — PROPOFOL 10 MG/ML IV BOLUS
INTRAVENOUS | Status: AC
  Filled 2014-10-19: qty 20

## 2014-10-19 MED ORDER — DEXAMETHASONE SODIUM PHOSPHATE 10 MG/ML IJ SOLN
INTRAMUSCULAR | Status: AC
  Filled 2014-10-19: qty 1

## 2014-11-23 ENCOUNTER — Emergency Department (HOSPITAL_COMMUNITY)
Admission: EM | Admit: 2014-11-23 | Discharge: 2014-11-24 | Disposition: A | Discharge status: Other Acute Inpatient Hospital | Payer: Medicare Other | Attending: Emergency Medicine | Admitting: Emergency Medicine

## 2014-11-23 ENCOUNTER — Encounter (HOSPITAL_COMMUNITY): Payer: Self-pay | Admitting: Emergency Medicine

## 2014-11-23 DIAGNOSIS — Z972 Presence of dental prosthetic device (complete) (partial): Secondary | ICD-10-CM | POA: Diagnosis not present

## 2014-11-23 DIAGNOSIS — Z973 Presence of spectacles and contact lenses: Secondary | ICD-10-CM | POA: Insufficient documentation

## 2014-11-23 DIAGNOSIS — T1491XA Suicide attempt, initial encounter: Secondary | ICD-10-CM

## 2014-11-23 DIAGNOSIS — K219 Gastro-esophageal reflux disease without esophagitis: Secondary | ICD-10-CM | POA: Insufficient documentation

## 2014-11-23 DIAGNOSIS — F419 Anxiety disorder, unspecified: Secondary | ICD-10-CM | POA: Insufficient documentation

## 2014-11-23 DIAGNOSIS — Z79899 Other long term (current) drug therapy: Secondary | ICD-10-CM | POA: Insufficient documentation

## 2014-11-23 DIAGNOSIS — E785 Hyperlipidemia, unspecified: Secondary | ICD-10-CM | POA: Insufficient documentation

## 2014-11-23 DIAGNOSIS — Z791 Long term (current) use of non-steroidal anti-inflammatories (NSAID): Secondary | ICD-10-CM | POA: Insufficient documentation

## 2014-11-23 DIAGNOSIS — F329 Major depressive disorder, single episode, unspecified: Secondary | ICD-10-CM | POA: Insufficient documentation

## 2014-11-23 DIAGNOSIS — Z720 Tobacco use: Secondary | ICD-10-CM | POA: Insufficient documentation

## 2014-11-23 DIAGNOSIS — Z7982 Long term (current) use of aspirin: Secondary | ICD-10-CM | POA: Diagnosis not present

## 2014-11-23 DIAGNOSIS — N390 Urinary tract infection, site not specified: Secondary | ICD-10-CM | POA: Insufficient documentation

## 2014-11-23 DIAGNOSIS — Z046 Encounter for general psychiatric examination, requested by authority: Secondary | ICD-10-CM | POA: Diagnosis present

## 2014-11-23 DIAGNOSIS — G8929 Other chronic pain: Secondary | ICD-10-CM | POA: Insufficient documentation

## 2014-11-23 DIAGNOSIS — M1990 Unspecified osteoarthritis, unspecified site: Secondary | ICD-10-CM | POA: Diagnosis not present

## 2014-11-23 DIAGNOSIS — F1410 Cocaine abuse, uncomplicated: Secondary | ICD-10-CM | POA: Diagnosis not present

## 2014-11-23 LAB — CBC WITH DIFFERENTIAL/PLATELET
Basophils Absolute: 0 10*3/uL (ref 0.0–0.1)
Basophils Relative: 0 % (ref 0–1)
Eosinophils Absolute: 0.1 10*3/uL (ref 0.0–0.7)
Eosinophils Relative: 0 % (ref 0–5)
HEMATOCRIT: 36 % (ref 36.0–46.0)
HEMOGLOBIN: 11.6 g/dL — AB (ref 12.0–15.0)
LYMPHS ABS: 3.6 10*3/uL (ref 0.7–4.0)
LYMPHS PCT: 21 % (ref 12–46)
MCH: 26.8 pg (ref 26.0–34.0)
MCHC: 32.2 g/dL (ref 30.0–36.0)
MCV: 83.1 fL (ref 78.0–100.0)
MONOS PCT: 8 % (ref 3–12)
Monocytes Absolute: 1.4 10*3/uL — ABNORMAL HIGH (ref 0.1–1.0)
NEUTROS ABS: 11.8 10*3/uL — AB (ref 1.7–7.7)
NEUTROS PCT: 71 % (ref 43–77)
Platelets: 205 10*3/uL (ref 150–400)
RBC: 4.33 MIL/uL (ref 3.87–5.11)
RDW: 15.1 % (ref 11.5–15.5)
WBC: 16.9 10*3/uL — ABNORMAL HIGH (ref 4.0–10.5)

## 2014-11-23 LAB — COMPREHENSIVE METABOLIC PANEL WITH GFR
ALT: 10 U/L — ABNORMAL LOW (ref 14–54)
AST: 15 U/L (ref 15–41)
Albumin: 3.4 g/dL — ABNORMAL LOW (ref 3.5–5.0)
Alkaline Phosphatase: 55 U/L (ref 38–126)
Anion gap: 9 (ref 5–15)
BUN: 10 mg/dL (ref 6–20)
CO2: 26 mmol/L (ref 22–32)
Calcium: 8.3 mg/dL — ABNORMAL LOW (ref 8.9–10.3)
Chloride: 95 mmol/L — ABNORMAL LOW (ref 101–111)
Creatinine, Ser: 0.8 mg/dL (ref 0.44–1.00)
GFR calc Af Amer: >60 mL/min (ref >60)
GFR calc non Af Amer: >60 mL/min (ref >60)
Glucose, Bld: 120 mg/dL — ABNORMAL HIGH (ref 65–99)
Potassium: 3.1 mmol/L — ABNORMAL LOW (ref 3.5–5.1)
Sodium: 130 mmol/L — ABNORMAL LOW (ref 135–145)
Total Bilirubin: 0.5 mg/dL (ref 0.3–1.2)
Total Protein: 6.4 g/dL — ABNORMAL LOW (ref 6.5–8.1)

## 2014-11-23 LAB — RAPID URINE DRUG SCREEN, HOSP PERFORMED
Amphetamines: NOT DETECTED
Barbiturates: NOT DETECTED
Benzodiazepines: NOT DETECTED
COCAINE: POSITIVE — AB
OPIATES: NOT DETECTED
TETRAHYDROCANNABINOL: NOT DETECTED

## 2014-11-23 LAB — URINALYSIS, ROUTINE W REFLEX MICROSCOPIC
BILIRUBIN URINE: NEGATIVE
Glucose, UA: NEGATIVE mg/dL
Ketones, ur: NEGATIVE mg/dL
NITRITE: NEGATIVE
PH: 6 (ref 5.0–8.0)
Protein, ur: NEGATIVE mg/dL
SPECIFIC GRAVITY, URINE: 1.015 (ref 1.005–1.030)
Urobilinogen, UA: 0.2 mg/dL (ref 0.0–1.0)

## 2014-11-23 LAB — URINE MICROSCOPIC-ADD ON

## 2014-11-23 LAB — SALICYLATE LEVEL: Salicylate Lvl: <4 mg/dL (ref 2.8–30.0)

## 2014-11-23 LAB — ETHANOL

## 2014-11-23 LAB — ACETAMINOPHEN LEVEL: Acetaminophen (Tylenol), Serum: <10 ug/mL — ABNORMAL LOW (ref 10–30)

## 2014-11-23 MED ORDER — IBUPROFEN 400 MG PO TABS: 600.0000 mg | ORAL_TABLET | Freq: Three times a day (TID) | ORAL | Status: DC | PRN

## 2014-11-23 MED ORDER — NICOTINE 21 MG/24HR TD PT24
21.0000 mg | MEDICATED_PATCH | Freq: Once | TRANSDERMAL | Status: DC
  Administered 2014-11-23: 21 mg via TRANSDERMAL
  Filled 2014-11-23: qty 1

## 2014-11-23 MED ORDER — ASPIRIN EC 81 MG PO TBEC
81.0000 mg | DELAYED_RELEASE_TABLET | Freq: Every day | ORAL | Status: DC
  Administered 2014-11-23: 81 mg via ORAL
  Filled 2014-11-23: qty 1

## 2014-11-23 MED ORDER — GABAPENTIN 300 MG PO CAPS
600.0000 mg | ORAL_CAPSULE | Freq: Three times a day (TID) | ORAL | Status: DC
Start: 2014-11-23 — End: 2014-11-24
  Administered 2014-11-23: 600 mg via ORAL
  Filled 2014-11-23: qty 2

## 2014-11-23 MED ORDER — CEPHALEXIN 500 MG PO CAPS
500.0000 mg | ORAL_CAPSULE | Freq: Two times a day (BID) | ORAL | Status: DC
  Administered 2014-11-23: 500 mg via ORAL
  Filled 2014-11-23: qty 1

## 2014-11-23 MED ORDER — ACETAMINOPHEN 325 MG PO TABS
650.0000 mg | ORAL_TABLET | ORAL | Status: DC | PRN
  Administered 2014-11-24: 650 mg via ORAL
  Filled 2014-11-23: qty 2

## 2014-11-23 MED ORDER — ONDANSETRON HCL 4 MG PO TABS: 4.0000 mg | ORAL_TABLET | Freq: Three times a day (TID) | ORAL | Status: DC | PRN

## 2014-11-23 MED ORDER — BUPROPION HCL 100 MG PO TABS
100.0000 mg | ORAL_TABLET | Freq: Three times a day (TID) | ORAL | Status: DC
  Administered 2014-11-23: 100 mg via ORAL
  Filled 2014-11-23 (×2): qty 1

## 2014-11-23 MED ORDER — FLUOXETINE HCL 40 MG PO CAPS: 40.0000 mg | ORAL_CAPSULE | Freq: Every day | ORAL | Status: DC

## 2014-11-23 MED ORDER — ATORVASTATIN CALCIUM 20 MG PO TABS: 20.0000 mg | ORAL_TABLET | Freq: Every day | ORAL | Status: DC

## 2014-11-23 MED ORDER — FAMOTIDINE 20 MG PO TABS
40.0000 mg | ORAL_TABLET | Freq: Every day | ORAL | Status: DC
Start: 2014-11-23 — End: 2014-11-24
  Administered 2014-11-23: 40 mg via ORAL
  Filled 2014-11-23: qty 2

## 2014-11-23 MED ORDER — ALUM & MAG HYDROXIDE-SIMETH 200-200-20 MG/5ML PO SUSP: 30.0000 mL | ORAL | Status: DC | PRN

## 2014-11-23 NOTE — ED Provider Notes (Signed)
CSN: [id number]     Arrival date & time 11/23/14  1746 History   First MD Initiated Contact with Patient 11/23/14 1819     Chief Complaint  Patient presents with  . V70.1     (Consider location/radiation/quality/duration/timing/severity/associated sxs/prior Treatment) The history is provided by the patient.   patient presents suicidal after suicidal attempt last night. She states she took some of her trazodone in attempt. States she was upset when she woke up this morning. She states she's had a lot going on her life and has lost family members due to her drug use. She states that if she could go now she would kill herself. She smokes crack. She states she was supposed to have back surgery but was not able to get it done. She has chronic pain.  Past Medical History  Diagnosis Date  . Knee pain   . Chronic pain   . Wears glasses   . Wears dentures     upper  . GERD (gastroesophageal reflux disease)     occ  . Depression   . Arthritis   . Hyperlipidemia   . Anxiety    Past Surgical History  Procedure Laterality Date  . Total hip arthroplasty  2010    lt hip  . Cesarean section      x2  . Tubal ligation     Family History  Problem Relation Age of Onset  . Kidney disease Mother   . Heart disease Father    Social History  Substance Use Topics  . Smoking status: Current Every Day Smoker -- 1.50 packs/day  . Smokeless tobacco: Never Used  . Alcohol Use: No   OB History    No data available     Review of Systems  Constitutional: Negative for fever.  Respiratory: Negative for shortness of breath.   Cardiovascular: Negative for chest pain.  Gastrointestinal: Negative for abdominal pain.  Genitourinary: Negative for dysuria.  Neurological: Negative for seizures.  Psychiatric/Behavioral: Positive for suicidal ideas and dysphoric mood.      Allergies  Codeine  Home Medications   Prior to Admission medications   Medication Sig Start Date End Date Taking?  Authorizing Provider  aspirin EC 81 MG tablet Take 81 mg by mouth daily.   Yes Historical Provider, MD  atorvastatin (LIPITOR) 20 MG tablet TAKE ONE TABLET BY MOUTH EVERY DAY 10/18/14  Yes Ernestina Penna, MD  buPROPion [facility]) 100 MG tablet TAKE ONE TABLET BY MOUTH 3 TIMES DAILY 10/18/14  Yes Ernestina Penna, MD  FLUoxetine (PROZAC) 40 MG capsule Take 1 capsule (40 mg total) by mouth daily. 05/15/14  Yes Junie Spencer, FNP  gabapentin (NEURONTIN) 300 MG capsule TAKE 2 CAPSULES BY MOUTH 3 TIMES DAILY 10/18/14  Yes Ernestina Penna, MD  meloxicam (MOBIC) 15 MG tablet TAKE 1 TABLET BY MOUTH EVERY DAY 11/22/13  Yes Junie Spencer, FNP  ranitidine (ZANTAC) 150 MG tablet Take 150 mg by mouth daily.   Yes Historical Provider, MD  traZODone (DESYREL) 50 MG tablet TAKE 1 TABLET BY MOUTH EVERY NIGHT AT BEDTIME 09/11/14  Yes Mary-Margaret Daphine Deutscher, FNP  varenicline (CHANTIX) 0.5 MG tablet Take 1 tablet (0.5 mg total) by mouth 2 (two) times daily. Take starting pack as directed 08/18/14  Yes Inis Sizer, PA-C  carisoprodol (SOMA) 350 MG tablet TAKE ONE TABLET BY MOUTH EVERY 8 HOURS AS NEEDED Patient not taking: Reported on 11/23/2014 10/11/14   Junie Spencer, FNP  diclofenac sodium (VOLTAREN) 1 %  GEL Apply 2 g topically 4 (four) times daily. 07/19/12   Clydie Braun Prueter, PA-C   BP 95/50 mmHg  Pulse 72  Temp(Src) 97.7 F (36.5 C) (Oral)  Resp 20  Ht  (1.6 m)  Wt 160 lb (72.576 kg)  BMI 28.35 kg/m2  SpO2 95% Physical Exam  Constitutional: She is oriented to person, place, and time. She appears well-developed and well-nourished.  HENT:  Head: Atraumatic.  Neck: Neck supple.  Cardiovascular: Normal rate.   Pulmonary/Chest: Effort normal.  Abdominal: Soft. There is no tenderness.  Musculoskeletal: Normal range of motion.  Neurological: She is alert and oriented to person, place, and time.  Skin: Skin is warm.  Psychiatric:  Patient appears depressed    ED Course  Procedures (including critical care  time) Labs Review Labs Reviewed  COMPREHENSIVE METABOLIC PANEL - Abnormal; Notable for the following:    Sodium 130 (*)    Potassium 3.1 (*)    Chloride 95 (*)    Glucose, Bld 120 (*)    Calcium 8.3 (*)    Total Protein 6.4 (*)    Albumin 3.4 (*)    ALT 10 (*)    All other components within normal limits  ACETAMINOPHEN LEVEL - Abnormal; Notable for the following:    Acetaminophen (Tylenol), Serum <10 (*)    All other components within normal limits  URINE RAPID DRUG SCREEN, HOSP PERFORMED - Abnormal; Notable for the following:    Cocaine POSITIVE (*)    All other components within normal limits  URINALYSIS, ROUTINE W REFLEX MICROSCOPIC (NOT AT [facility]) - Abnormal; Notable for the following:    APPearance HAZY (*)    Hgb urine dipstick SMALL (*)    Leukocytes, UA LARGE (*)    All other components within normal limits  CBC WITH DIFFERENTIAL/PLATELET - Abnormal; Notable for the following:    WBC 16.9 (*)    Hemoglobin 11.6 (*)    Neutro Abs 11.8 (*)    Monocytes Absolute 1.4 (*)    All other components within normal limits  URINE MICROSCOPIC-ADD ON - Abnormal; Notable for the following:    Squamous Epithelial / LPF MANY (*)    Bacteria, UA MANY (*)    All other components within normal limits  URINE CULTURE  ETHANOL  SALICYLATE LEVEL    Imaging Review No results found. I have personally reviewed and evaluated these images and lab results as part of my medical decision-making.   EKG Interpretation None      MDM   Final diagnoses:  Suicide attempt  UTI (lower urinary tract infection)    Patient with depression and suicide attempt. Appears to medically cleared at this time from a suicide attempt. Has apparent urinary tract infection which will be treated. Doubt this UTI as the cause of her depression. Medically cleared to be seen by TTS.    Benjiman Core, MD 11/23/14 2126

## 2014-11-23 NOTE — ED Provider Notes (Signed)
Accepted at behavioral health by Dr. Dub Mikes pending bed availability  Eber Hong, MD 11/23/14 [phone]

## 2014-11-23 NOTE — ED Notes (Signed)
Security at bedside wanding pt. Pt has changed into psych scrubs.

## 2014-11-23 NOTE — ED Notes (Signed)
Patient ambulatory to restroom to collect urine sample 

## 2014-11-23 NOTE — ED Notes (Signed)
Patient ambulatory to restroom  ?

## 2014-11-23 NOTE — ED Notes (Signed)
Pt arrives via [facility] with IVC papers in place. Per IVC paperwork, patient having SI with intent to OD on medications. Paperwork states she attempted to OD last night on medications and was unsuccessful. Patient alert. States "as soon as I'm free I will kill myself"
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
## 2014-11-23 NOTE — ED Notes (Signed)
TTS complete 

## 2014-11-23 NOTE — ED Notes (Signed)
Pt c/o trying to kill herself by overdosing on 10 tablets of Trazodone and 10 tablets of Gabapentin last night. Pt states "I will do it again as soon as I leave". Pt has hx of attempted suicide in the past. Pt reports using crack cocaine. Pt denies any family support, husband died 2 years ago and daughters having nothing to do with her due to illegal drug use. Pt arrived to ED via [facility] with IVC paperwork in hand.

## 2014-11-24 ENCOUNTER — Encounter (HOSPITAL_COMMUNITY): Payer: Self-pay | Admitting: *Deleted

## 2014-11-24 ENCOUNTER — Inpatient Hospital Stay (HOSPITAL_COMMUNITY)
Admission: AD | Admit: 2014-11-24 | Discharge: 2014-12-01 | DRG: 885 | Disposition: A | Discharge status: Other Acute Inpatient Hospital | Payer: Medicaid Other | Source: Intra-hospital | Attending: Family Medicine | Admitting: Family Medicine

## 2014-11-24 DIAGNOSIS — J850 Gangrene and necrosis of lung: Secondary | ICD-10-CM | POA: Diagnosis not present

## 2014-11-24 DIAGNOSIS — N390 Urinary tract infection, site not specified: Secondary | ICD-10-CM | POA: Diagnosis present

## 2014-11-24 DIAGNOSIS — F419 Anxiety disorder, unspecified: Secondary | ICD-10-CM | POA: Diagnosis present

## 2014-11-24 DIAGNOSIS — K219 Gastro-esophageal reflux disease without esophagitis: Secondary | ICD-10-CM | POA: Diagnosis present

## 2014-11-24 DIAGNOSIS — R079 Chest pain, unspecified: Secondary | ICD-10-CM | POA: Diagnosis present

## 2014-11-24 DIAGNOSIS — F339 Major depressive disorder, recurrent, unspecified: Secondary | ICD-10-CM | POA: Diagnosis not present

## 2014-11-24 DIAGNOSIS — R45851 Suicidal ideations: Secondary | ICD-10-CM | POA: Diagnosis present

## 2014-11-24 DIAGNOSIS — Z96642 Presence of left artificial hip joint: Secondary | ICD-10-CM | POA: Diagnosis present

## 2014-11-24 DIAGNOSIS — F331 Major depressive disorder, recurrent, moderate: Secondary | ICD-10-CM | POA: Diagnosis present

## 2014-11-24 DIAGNOSIS — E876 Hypokalemia: Secondary | ICD-10-CM | POA: Diagnosis present

## 2014-11-24 DIAGNOSIS — F1420 Cocaine dependence, uncomplicated: Secondary | ICD-10-CM

## 2014-11-24 DIAGNOSIS — G4700 Insomnia, unspecified: Secondary | ICD-10-CM | POA: Diagnosis present

## 2014-11-24 DIAGNOSIS — Z789 Other specified health status: Secondary | ICD-10-CM | POA: Diagnosis not present

## 2014-11-24 DIAGNOSIS — Z8249 Family history of ischemic heart disease and other diseases of the circulatory system: Secondary | ICD-10-CM

## 2014-11-24 DIAGNOSIS — Z79899 Other long term (current) drug therapy: Secondary | ICD-10-CM | POA: Diagnosis not present

## 2014-11-24 DIAGNOSIS — F1410 Cocaine abuse, uncomplicated: Secondary | ICD-10-CM | POA: Diagnosis not present

## 2014-11-24 DIAGNOSIS — E785 Hyperlipidemia, unspecified: Secondary | ICD-10-CM | POA: Diagnosis present

## 2014-11-24 DIAGNOSIS — J189 Pneumonia, unspecified organism: Secondary | ICD-10-CM

## 2014-11-24 DIAGNOSIS — E780 Pure hypercholesterolemia: Secondary | ICD-10-CM | POA: Diagnosis present

## 2014-11-24 DIAGNOSIS — T426X2A Poisoning by other antiepileptic and sedative-hypnotic drugs, intentional self-harm, initial encounter: Secondary | ICD-10-CM

## 2014-11-24 DIAGNOSIS — F329 Major depressive disorder, single episode, unspecified: Secondary | ICD-10-CM | POA: Diagnosis not present

## 2014-11-24 DIAGNOSIS — F1994 Other psychoactive substance use, unspecified with psychoactive substance-induced mood disorder: Secondary | ICD-10-CM | POA: Diagnosis not present

## 2014-11-24 DIAGNOSIS — M1990 Unspecified osteoarthritis, unspecified site: Secondary | ICD-10-CM | POA: Diagnosis present

## 2014-11-24 DIAGNOSIS — A419 Sepsis, unspecified organism: Secondary | ICD-10-CM | POA: Diagnosis not present

## 2014-11-24 DIAGNOSIS — J852 Abscess of lung without pneumonia: Secondary | ICD-10-CM | POA: Diagnosis present

## 2014-11-24 DIAGNOSIS — F17210 Nicotine dependence, cigarettes, uncomplicated: Secondary | ICD-10-CM | POA: Diagnosis present

## 2014-11-24 DIAGNOSIS — F418 Other specified anxiety disorders: Secondary | ICD-10-CM | POA: Diagnosis not present

## 2014-11-24 DIAGNOSIS — Z7982 Long term (current) use of aspirin: Secondary | ICD-10-CM | POA: Diagnosis not present

## 2014-11-24 DIAGNOSIS — E871 Hypo-osmolality and hyponatremia: Secondary | ICD-10-CM | POA: Diagnosis present

## 2014-11-24 DIAGNOSIS — J851 Abscess of lung with pneumonia: Secondary | ICD-10-CM | POA: Diagnosis not present

## 2014-11-24 DIAGNOSIS — F1424 Cocaine dependence with cocaine-induced mood disorder: Secondary | ICD-10-CM | POA: Diagnosis not present

## 2014-11-24 DIAGNOSIS — Z96649 Presence of unspecified artificial hip joint: Secondary | ICD-10-CM | POA: Diagnosis present

## 2014-11-24 DIAGNOSIS — R6520 Severe sepsis without septic shock: Secondary | ICD-10-CM | POA: Diagnosis not present

## 2014-11-24 DIAGNOSIS — F332 Major depressive disorder, recurrent severe without psychotic features: Secondary | ICD-10-CM | POA: Diagnosis present

## 2014-11-24 DIAGNOSIS — F411 Generalized anxiety disorder: Secondary | ICD-10-CM | POA: Diagnosis present

## 2014-11-24 DIAGNOSIS — Z885 Allergy status to narcotic agent status: Secondary | ICD-10-CM | POA: Diagnosis not present

## 2014-11-24 DIAGNOSIS — D638 Anemia in other chronic diseases classified elsewhere: Secondary | ICD-10-CM | POA: Diagnosis present

## 2014-11-24 DIAGNOSIS — T1491 Suicide attempt: Secondary | ICD-10-CM | POA: Diagnosis not present

## 2014-11-24 DIAGNOSIS — G8929 Other chronic pain: Secondary | ICD-10-CM | POA: Diagnosis present

## 2014-11-24 DIAGNOSIS — B961 Klebsiella pneumoniae [K. pneumoniae] as the cause of diseases classified elsewhere: Secondary | ICD-10-CM | POA: Diagnosis present

## 2014-11-24 DIAGNOSIS — T450X2A Poisoning by antiallergic and antiemetic drugs, intentional self-harm, initial encounter: Secondary | ICD-10-CM | POA: Diagnosis not present

## 2014-11-24 DIAGNOSIS — I959 Hypotension, unspecified: Secondary | ICD-10-CM | POA: Diagnosis present

## 2014-11-24 DIAGNOSIS — J9600 Acute respiratory failure, unspecified whether with hypoxia or hypercapnia: Secondary | ICD-10-CM | POA: Diagnosis present

## 2014-11-24 MED ORDER — FAMOTIDINE 20 MG PO TABS
10.0000 mg | ORAL_TABLET | Freq: Every day | ORAL | Status: DC
  Administered 2014-11-24 – 2014-12-01 (×7): 10 mg via ORAL
  Filled 2014-11-24 (×11): qty 1

## 2014-11-24 MED ORDER — ALUM & MAG HYDROXIDE-SIMETH 200-200-20 MG/5ML PO SUSP: 30.0000 mL | ORAL | Status: DC | PRN

## 2014-11-24 MED ORDER — ACETAMINOPHEN 325 MG PO TABS
650.0000 mg | ORAL_TABLET | Freq: Four times a day (QID) | ORAL | Status: DC | PRN
  Administered 2014-11-24 – 2014-11-28 (×8): 650 mg via ORAL
  Filled 2014-11-24 (×8): qty 2

## 2014-11-24 MED ORDER — IBUPROFEN 800 MG PO TABS: 800.0000 mg | ORAL_TABLET | Freq: Four times a day (QID) | ORAL | Status: DC | PRN

## 2014-11-24 MED ORDER — MAGNESIUM HYDROXIDE 400 MG/5ML PO SUSP: 30.0000 mL | Freq: Every day | ORAL | Status: DC | PRN

## 2014-11-24 MED ORDER — GABAPENTIN 100 MG PO CAPS
ORAL_CAPSULE | ORAL | Status: AC
  Administered 2014-11-24: 200 mg via ORAL
  Filled 2014-11-24: qty 2

## 2014-11-24 MED ORDER — PANTOPRAZOLE SODIUM 20 MG PO TBEC: 20.0000 mg | DELAYED_RELEASE_TABLET | Freq: Every day | ORAL | Status: DC

## 2014-11-24 MED ORDER — DULOXETINE HCL 20 MG PO CPEP
20.0000 mg | ORAL_CAPSULE | Freq: Every day | ORAL | Status: DC
  Administered 2014-11-25: 20 mg via ORAL
  Filled 2014-11-24 (×4): qty 1

## 2014-11-24 MED ORDER — IBUPROFEN 800 MG PO TABS
ORAL_TABLET | ORAL | Status: AC
  Administered 2014-11-24: 800 mg
  Filled 2014-11-24: qty 1

## 2014-11-24 MED ORDER — GABAPENTIN 300 MG PO CAPS
300.0000 mg | ORAL_CAPSULE | Freq: Two times a day (BID) | ORAL | Status: DC
  Administered 2014-11-24 – 2014-11-26 (×4): 300 mg via ORAL
  Filled 2014-11-24 (×9): qty 1

## 2014-11-24 MED ORDER — GABAPENTIN 100 MG PO CAPS
200.0000 mg | ORAL_CAPSULE | Freq: Two times a day (BID) | ORAL | Status: DC
  Administered 2014-11-24: 200 mg via ORAL
  Filled 2014-11-24 (×2): qty 2

## 2014-11-24 MED ORDER — POTASSIUM CHLORIDE CRYS ER 20 MEQ PO TBCR
20.0000 meq | EXTENDED_RELEASE_TABLET | Freq: Two times a day (BID) | ORAL | Status: AC
  Administered 2014-11-24 – 2014-11-25 (×3): 20 meq via ORAL
  Filled 2014-11-24 (×5): qty 1

## 2014-11-24 MED ORDER — ASPIRIN EC 81 MG PO TBEC
81.0000 mg | DELAYED_RELEASE_TABLET | Freq: Every day | ORAL | Status: DC
  Administered 2014-11-24: 81 mg via ORAL
  Filled 2014-11-24 (×3): qty 1

## 2014-11-24 MED ORDER — MELOXICAM 7.5 MG PO TABS
15.0000 mg | ORAL_TABLET | Freq: Every day | ORAL | Status: DC
  Administered 2014-11-24 – 2014-12-01 (×8): 15 mg via ORAL
  Filled 2014-11-24 (×2): qty 1
  Filled 2014-11-24 (×3): qty 2
  Filled 2014-11-24 (×2): qty 1
  Filled 2014-11-24 (×6): qty 2

## 2014-11-24 MED ORDER — ATORVASTATIN CALCIUM 20 MG PO TABS
20.0000 mg | ORAL_TABLET | Freq: Every day | ORAL | Status: DC
  Administered 2014-11-24 – 2014-12-01 (×7): 20 mg via ORAL
  Filled 2014-11-24: qty 1
  Filled 2014-11-24: qty 2
  Filled 2014-11-24 (×9): qty 1

## 2014-11-24 MED ORDER — GABAPENTIN 300 MG PO CAPS
300.0000 mg | ORAL_CAPSULE | Freq: Three times a day (TID) | ORAL | Status: DC
  Filled 2014-11-24: qty 1

## 2014-11-24 MED ORDER — TRAZODONE HCL 50 MG PO TABS
50.0000 mg | ORAL_TABLET | Freq: Every day | ORAL | Status: DC
  Administered 2014-11-24 – 2014-11-25 (×2): 50 mg via ORAL
  Filled 2014-11-24 (×5): qty 1

## 2014-11-24 MED ORDER — DICLOFENAC SODIUM 1 % TD GEL: 2.0000 g | Freq: Four times a day (QID) | TRANSDERMAL | Status: DC

## 2014-11-24 MED ORDER — NITROFURANTOIN MONOHYD MACRO 100 MG PO CAPS
100.0000 mg | ORAL_CAPSULE | Freq: Two times a day (BID) | ORAL | Status: DC
  Administered 2014-11-24 – 2014-12-01 (×13): 100 mg via ORAL
  Filled 2014-11-24 (×19): qty 1

## 2014-11-24 MED ORDER — TRAZODONE HCL 50 MG PO TABS: 50.0000 mg | ORAL_TABLET | Freq: Every evening | ORAL | Status: DC | PRN

## 2014-11-24 MED ORDER — NICOTINE 21 MG/24HR TD PT24
21.0000 mg | MEDICATED_PATCH | Freq: Every day | TRANSDERMAL | Status: DC
  Administered 2014-11-24 – 2014-12-01 (×7): 21 mg via TRANSDERMAL
  Filled 2014-11-24 (×10): qty 1

## 2014-11-24 NOTE — [facility] Counselor (Signed)
Called [facility] per [facility] request to advised that they can send the pt anytime. Spoke with nurse Jill Alexanders and he said he would send her now.  Beryle Flock, MS, CRC, [facility] Triage Specialist [facility]

## 2014-11-24 NOTE — ED Notes (Signed)
Patient ambulatory to restroom  ?

## 2014-11-24 NOTE — ED Notes (Signed)
Patient verbalizes understanding of discharge from [facility] to [facility]. Patient ambulatory out of department at this time with [facility] going to [facility].

## 2014-11-24 NOTE — [facility] Group Notes (Signed)
[facility] LCSW Group Therapy  11/24/2014 12:43 PM  Type of Therapy:  Group Therapy  Participation Level:  Did Not Attend-pt in room resting.   Modes of Intervention:  Confrontation, Discussion, Education, Exploration, Problem-solving, Rapport Building, Socialization and Support  Summary of Progress/Problems: Feelings around Relapse. Group members discussed the meaning of relapse and shared personal stories of relapse, how it affected them and others, and how they perceived themselves during this time. Group members were encouraged to identify triggers, warning signs and coping skills used when facing the possibility of relapse. Social supports were discussed and explored in detail. Post Acute Withdrawal Syndrome (handout provided) was introduced and examined. Pt's were encouraged to ask questions, talk about key points associated with PAWS, and process this information in terms of relapse prevention.   Smart, Garrin Kirwan LCSWA 11/24/2014, 12:43 PM
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
## 2014-11-24 NOTE — Tx Team (Signed)
Initial Interdisciplinary Treatment Plan   PATIENT STRESSORS: Financial difficulties Health problems Legal issue Occupational concerns Substance abuse   PATIENT STRENGTHS: Average or above average intelligence Capable of independent living Communication skills General fund of knowledge Motivation for treatment/growth   PROBLEM LIST: Problem List/Patient Goals Date to be addressed Date deferred Reason deferred Estimated date of resolution  "depression" 11/24/2014   D/c  "anxiety" 11/24/2014   D/c  "panic" 11/24/2014   D/c  "substance abuse" 11/24/2014   D/c  "suicidal thoughts" 11/24/2014   D/c                           DISCHARGE CRITERIA:  Ability to meet basic life and health needs Adequate post-discharge living arrangements Improved stabilization in mood, thinking, and/or behavior Medical problems require only outpatient monitoring Motivation to continue treatment in a less acute level of care Need for constant or close observation no longer present Reduction of life-threatening or endangering symptoms to within safe limits Safe-care adequate arrangements made Verbal commitment to aftercare and medication compliance Withdrawal symptoms are absent or subacute and managed without 24-hour nursing intervention  PRELIMINARY DISCHARGE PLAN: Attend 12-step recovery group Outpatient therapy Participate in family therapy Return to previous living arrangement  PATIENT/FAMIILY INVOLVEMENT: This treatment plan has been presented to and reviewed with the patient, RAISSA DAM.  The patient and family have been given the opportunity to ask questions and make suggestions.  Earline Mayotte 11/24/2014, 12:50 PM
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
## 2014-11-24 NOTE — Progress Notes (Addendum)
Nurse received verbal order from Dr. Jama Flavors for neurotin 200 mg bid starting now.  Also order from provider for ibuprofen 800 mg.  Patient given neurotin 200 mg and ibuprofen 800 mg cabinet override for pain and detox.  Patient laying in bed resting with eyes closed.  Medications discussed with NP and pharmacy.

## 2014-11-24 NOTE — Progress Notes (Signed)
D.  Pt pleasant on approach, complaint of generalized body aches.  Denies SI/HI/hallucinations at this time.  Interacting appropriately with peers on the unit.  Pt is a new admission.  A.  Support and encouragement offered, medication given as ordered for pain.  R.  Pt remains safe on unit, will continue to monitor.
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
## 2014-11-25 DIAGNOSIS — F329 Major depressive disorder, single episode, unspecified: Secondary | ICD-10-CM

## 2014-11-25 DIAGNOSIS — G4700 Insomnia, unspecified: Secondary | ICD-10-CM

## 2014-11-25 LAB — URINALYSIS W MICROSCOPIC (NOT AT ARMC)
Bilirubin Urine: NEGATIVE
GLUCOSE, UA: NEGATIVE mg/dL
HGB URINE DIPSTICK: NEGATIVE
Ketones, ur: NEGATIVE mg/dL
Nitrite: NEGATIVE
PH: 7.5 (ref 5.0–8.0)
PROTEIN: 30 mg/dL — AB
SPECIFIC GRAVITY, URINE: 1.015 (ref 1.005–1.030)
Urobilinogen, UA: >8 mg/dL — ABNORMAL HIGH (ref 0.0–1.0)

## 2014-11-25 MED ORDER — CARISOPRODOL 350 MG PO TABS
350.0000 mg | ORAL_TABLET | Freq: Three times a day (TID) | ORAL | Status: DC | PRN
  Administered 2014-11-25 – 2014-11-29 (×8): 350 mg via ORAL
  Filled 2014-11-25 (×8): qty 1

## 2014-11-25 NOTE — Progress Notes (Signed)
D.  Pt bright and pleasant on approach, complaint of generalized body pain.  Pt requested her Soma as well as some Tylenol.  Pt denies SI/HI/hallucinations at this time.  Pt was visible on unit interacting appropriately with peers and watching TV in dayroom.  A.  Support and encouragement offered, medications given as ordered, see pain flow chart.  R.  Pt remains safe on the unit, will continue to monitor.
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
## 2014-11-25 NOTE — Progress Notes (Signed)
[facility] MD Progress Note  11/25/2014 3:57 PM Summer Estrada  MRN:  [id number]  Subjective: Summer Estrada says, "I hurt all over, all the time. That is why I'm using cocaine to numb the pain. I found out today that I don't have my home any more. There is absolutely nothing that I can do to save my home. I owned my home for 30 years. It is my fault that I lost this home, my drug use. Even my children disowned me as we speak. I don't know what I'm going to do".  Objective: Summer Estrada is seen, chart reviewed. She is admitted to the unit for worsening depression leading to suicide attempts by overdose. Summer Estrada is still reporting worsening symptoms of depression. She says she stays in pain. Uses cocaine to numb the pain. Is hoping to get into a substance abuse treatment center or she will be discharged to streets, risking relapse again. Summer Estrada walks with a cane.  Principal Problem: <principal problem not specified>  Diagnosis:   Patient Active Problem List   Diagnosis Date Noted  . Cocaine abuse, continuous use [F14.10] 11/24/2014  . Low back pain [M54.5] 04/21/2013  . Hyperlipidemia [E78.5]   . GERD (gastroesophageal reflux disease) [K21.9]   . Arthritis [M19.90]   . Knee pain [M25.569]   . Wears dentures [Z98.811]   . Chronic pain [G89.29]   . Wears glasses [Z97.3]   . Trochanteric bursitis of right hip [M70.61] 06/22/2012  . Multinodular goiter [E04.2] 05/19/2012  . Medial meniscus tear [S83.249A] 05/07/2012  . Smoker [Z72.0] 04/14/2012  . Eczema [L30.9] 04/14/2012  . Hyperthyroidism [E05.90] 04/14/2012  . Hypotension [I95.9] 04/14/2012  . Cervical spinal stenosis [M48.02] 03/18/2012  . Insomnia [G47.00] 02/20/2012  . Osteoarthritis of both knees [M17.0] 08/08/2011  . Anxiety and depression [F41.8] 08/08/2011  . Cervical spondylosis [M47.812] 06/23/2011  . Patellar tendonitis [M76.50] 06/23/2011  . Myalgia and myositis, unspecified [M79.1, M60.9] 06/23/2011  . Calcifying tendinitis of shoulder  [M75.30] 06/23/2011   Total Time spent with patient: 20 minutes  Past Medical History:  Past Medical History  Diagnosis Date  . Knee pain   . Chronic pain   . Wears glasses   . Wears dentures     upper  . GERD (gastroesophageal reflux disease)     occ  . Depression   . Arthritis   . Hyperlipidemia   . Anxiety     Past Surgical History  Procedure Laterality Date  . Total hip arthroplasty  2010    lt hip  . Cesarean section      x2  . Tubal ligation     Family History:  Family History  Problem Relation Age of Onset  . Kidney disease Mother   . Heart disease Father    Social History:  History  Alcohol Use No     History  Drug Use  . Yes  . Special: Cocaine    Social History   Social History  . Marital Status: Widowed    Spouse Name: N/A  . Number of Children: N/A  . Years of Education: N/A   Social History Main Topics  . Smoking status: Current Every Day Smoker -- 1.50 packs/day  . Smokeless tobacco: Never Used  . Alcohol Use: No  . Drug Use: Yes    Special: Cocaine  . Sexual Activity: Yes    Birth Control/ Protection: Post-menopausal     Comment: none   Other Topics Concern  . None   Social History Narrative  WIDOW   CHILDREN; ADULT GIRLS; 3   DISABLED   Additional History:    Sleep: Good  Appetite:  Fair  Assessment: Major depressive disorder, recurrent episodes, Cocaine use disorder, severe, dependence  Musculoskeletal: Strength & Muscle Tone: within normal limits Gait & Station: unsteady Patient leans: Walks with a cane   Psychiatric Specialty Exam: Physical Exam  Review of Systems  Constitutional: Positive for malaise/fatigue.  Eyes: Negative.   Respiratory: Negative.   Cardiovascular: Negative.   Gastrointestinal: Negative.   Genitourinary: Negative.   Musculoskeletal: Positive for myalgias, back pain, joint pain and neck pain.  Skin: Negative.   Neurological: Positive for weakness.  Endo/Heme/Allergies: Negative.    Psychiatric/Behavioral: Positive for depression, suicidal ideas and substance abuse (Cocaine dependence). Negative for hallucinations and memory loss. The patient is nervous/anxious and has insomnia.     Blood pressure 99/66, pulse 73, temperature 97.6 F (36.4 C), temperature source Oral, resp. rate 20, height  (1.6 m), weight 75.297 kg (166 lb), SpO2 99 %.Body mass index is 29.41 kg/(m^2).  General Appearance: Casual, works with a Customer service manager::  Good  Speech:  Clear and Coherent  Volume:  Normal  Mood:  Anxious, Depressed and Hopeless  Affect:  Congruent and Tearful  Thought Process:  Coherent and Goal Directed  Orientation:  Full (Time, Place, and Person)  Thought Content:  Rumination and denies hallucinations  Suicidal Thoughts:  No  Homicidal Thoughts:  No  Memory:  Grossly intact  Judgement:  Fair  Insight:  Present  Psychomotor Activity:  Restlessness and uncomfortable  Concentration:  Fair  Recall:  Good  Fund of Knowledge:Fair  Language: Good  Akathisia:  No  Handed:  Right  AIMS (if indicated):     Assets:  Communication Skills Desire for Improvement  ADL's:  Intact  Cognition: WNL  Sleep:  Number of Hours: 6.5   Current Medications: Current Facility-Administered Medications  Medication Dose Route Frequency Provider Last Rate Last Dose  . acetaminophen (TYLENOL) tablet 650 mg  650 mg Oral Q6H PRN Sanjuana Kava, NP   650 mg at 11/25/14 1448  . alum & mag hydroxide-simeth (MAALOX/MYLANTA) 200-200-20 MG/5ML suspension 30 mL  30 mL Oral Q4H PRN Sanjuana Kava, NP      . atorvastatin (LIPITOR) tablet 20 mg  20 mg Oral Daily Sanjuana Kava, NP   20 mg at 11/25/14 0754  . DULoxetine (CYMBALTA) DR capsule 20 mg  20 mg Oral Daily Craige Cotta, MD   20 mg at 11/25/14 0754  . famotidine (PEPCID) tablet 10 mg  10 mg Oral Daily Sanjuana Kava, NP   10 mg at 11/25/14 0754  . gabapentin (NEURONTIN) capsule 300 mg  300 mg Oral BID Craige Cotta, MD   300 mg at  11/25/14 0754  . magnesium hydroxide (MILK OF MAGNESIA) suspension 30 mL  30 mL Oral Daily PRN Sanjuana Kava, NP      . meloxicam (MOBIC) tablet 15 mg  15 mg Oral Daily Sanjuana Kava, NP   15 mg at 11/25/14 0754  . nicotine (NICODERM CQ - dosed in mg/24 hours) patch 21 mg  21 mg Transdermal Daily Rachael Fee, MD   21 mg at 11/25/14 0753  . nitrofurantoin (macrocrystal-monohydrate) (MACROBID) capsule 100 mg  100 mg Oral Q12H Rockey Situ Cobos, MD   100 mg at 11/25/14 0754  . potassium chloride SA (K-DUR,KLOR-CON) CR tablet 20 mEq  20 mEq Oral BID Madaline Guthrie A Cobos,  MD   20 mEq at 11/25/14 0754  . traZODone (DESYREL) tablet 50 mg  50 mg Oral QHS Sanjuana Kava, NP   50 mg at 11/24/14 2117    Lab Results:  Results for orders placed or performed during the hospital encounter of 11/24/14 (from the past 48 hour(s))  Urinalysis with microscopic     Status: Abnormal   Collection Time: 11/24/14  7:14 AM  Result Value Ref Range   Color, Urine YELLOW YELLOW   APPearance CLOUDY (A) CLEAR   Specific Gravity, Urine 1.015 1.005 - 1.030   pH 7.5 5.0 - 8.0   Glucose, UA NEGATIVE NEGATIVE mg/dL   Hgb urine dipstick NEGATIVE NEGATIVE   Bilirubin Urine NEGATIVE NEGATIVE   Ketones, ur NEGATIVE NEGATIVE mg/dL   Protein, ur 30 (A) NEGATIVE mg/dL   Urobilinogen, UA >1.6 (H) 0.0 - 1.0 mg/dL   Nitrite NEGATIVE NEGATIVE   Leukocytes, UA SMALL (A) NEGATIVE   WBC, UA 11-20 <3 WBC/hpf   Bacteria, UA FEW (A) RARE   Squamous Epithelial / LPF MANY (A) RARE    Comment: Performed at [facility]    Physical Findings: AIMS: Facial and Oral Movements Muscles of Facial Expression: None, normal Lips and Perioral Area: None, normal Jaw: None, normal Tongue: None, normal,Extremity Movements Upper (arms, wrists, hands, fingers): None, normal Lower (legs, knees, ankles, toes): None, normal, Trunk Movements Neck, shoulders, hips: None, normal, Overall Severity Severity of abnormal movements (highest  score from questions above): None, normal Incapacitation due to abnormal movements: None, normal Patient's awareness of abnormal movements (rate only patient's report): No Awareness, Dental Status Current problems with teeth and/or dentures?: No Does patient usually wear dentures?: Yes (upper dentures)  CIWA:  CIWA-Ar Total: 3 COWS:  COWS Total Score: 3  Treatment Plan Summary: Daily contact with patient to assess and evaluate symptoms and progress in treatment and Medication management:   Plan: Supportive approach/coping skills/relapse prevention           Depression: will continue Cymbalta 20 mg and continue to work with mindfulness, CBT help  identify the cognitive distortions that keep the depression going          Discuss other life style changes that can help with both his depression and her cocaine use such like exercise, meditation           Cocaine dependence: will continue to use motivational interviewing and encourage to pursue total abstinence.           Insomnia; continue Trazodone 50 mg.           Continue Pepcid for Gerd, Meloxicam 15 mg for arthritis pain, Lipitor for high cholesterol, Kdur 20 Meq for low potassium & Macrobid for UTI.            Nicotine cravings; continue nicotine patch 21 mg.           Agitation/chronic pain; continue the Neurontin 300 mg            Social worker to work on Building surveyor Making:  Review of Psycho-Social Stressors (1), Review of Last Therapy Session (1), Review of Medication Regimen & Side Effects (2) and Review of New Medication or Change in Dosage (2)  Sanjuana Kava, PMHNP, FNP-BC 11/25/2014, 3:57 PM   Reviewed the information documented and agree with the treatment plan.  Quintasia Theroux,JANARDHAHA R. 11/25/2014 4:52 PM

## 2014-11-25 NOTE — [facility] Group Notes (Signed)
[facility] Group Notes:  (Nursing/MHT/Case Management/Adjunct)  Date:  11/25/2014  Time:  10:30 AM  Type of Therapy:  Psychoeducational Skills  Participation Level:  Did Not Attend  Participation Quality:  Did Not Attend  Affect:  Did Not Attend  Cognitive:  Did Not Attend  Insight:  None  Engagement in Group:  Did Not Attend  Modes of Intervention:  Did Not Attend  Summary of Progress/Problems: Pt did not attend patient self inventory group.   Summer Estrada 11/25/2014, 10:30 AM 

## 2014-11-25 NOTE — [facility] Suicide Risk Assessment (Signed)
[facility] INPATIENT:  Family/Significant Other Suicide Prevention Education  Suicide Prevention Education:  Patient Refusal for Family/Significant Other Suicide Prevention Education: The patient Summer Estrada has refused to provide written consent for family/significant other to be provided Family/Significant Other Suicide Prevention Education during admission and/or prior to discharge.  Physician notified.  Patient denies access to any firearms.  Sarina Ser 11/25/2014, 11:55 AM

## 2014-11-25 NOTE — Progress Notes (Signed)
Patient ID: Summer Estrada, female   DOB: 1960/08/16, 54 y.o.   MRN: [id number]   D: Pt has been very labile on the unit today, at times patient is very irritable and agitated other times she is interacting with her peers and having a good time. Pt reported that everybody was getting on her nerves, and that's why she was staying in her room. Pt reported her depression as a 10, her hopelessness as a 10, and her anxiety as a 0. Pt reported that her goal was to feel better so that she could work on her addiction. Pt did not attend any groups nor did she engage in treatment. Pt reported being negative SI/HI, no AH/VH noted. A: 15 min checks continued for patient safety. R: Pt safety maintained.

## 2014-11-25 NOTE — [facility] Group Notes (Signed)
[facility] Group Notes:  (Clinical Social Work)  11/25/2014     10-11AM  Summary of Progress/Problems:   The main focus of today's process group was to learn how to use a decisional balance exercise to move forward in the Stages of Change, which were described and discussed.  Patients listed needs on the whiteboard and unhealthy coping techniques often used to fill needs.  Motivational Interviewing and the whiteboard were utilized to help patients explore in depth the perceived benefits and costs of unhealthy coping techniques, as well as the  benefits and costs of replacing that with a healthy coping skills.  A handout was distributed for patients to be able to do this exercise for themselves.   The patient expressed that their own unhealthy coping involves being suicidal, drugging/drinking, not taking medications, negative thoughts, unhealthy relationships and more.  She participated well in group and was able to participate fully in the discussion despite what was her obvious physical pain.  She stated she has decided to stop doing cocaine, that she has to do so or she is going to die.  Type of Therapy:  Group Therapy - Process   Participation Level:  Active  Participation Quality:  Appropriate, Attentive, Sharing and Supportive  Affect:  Blunted and in pain  Cognitive:  Appropriate  Insight:  Engaged  Engagement in Therapy:  Engaged  Modes of Intervention:  Education, Motivational Interviewing  Ambrose Mantle, LCSW 11/25/2014, 11:55 AM

## 2014-11-26 DIAGNOSIS — F339 Major depressive disorder, recurrent, unspecified: Secondary | ICD-10-CM

## 2014-11-26 LAB — URINE CULTURE
Culture: >=100000
Culture: NO GROWTH

## 2014-11-26 MED ORDER — DULOXETINE HCL 20 MG PO CPEP
40.0000 mg | ORAL_CAPSULE | Freq: Every day | ORAL | Status: DC
  Administered 2014-11-27: 40 mg via ORAL
  Filled 2014-11-26 (×2): qty 2

## 2014-11-26 MED ORDER — TRAZODONE HCL 100 MG PO TABS
100.0000 mg | ORAL_TABLET | Freq: Every day | ORAL | Status: DC
  Administered 2014-11-27 – 2014-11-30 (×4): 100 mg via ORAL
  Filled 2014-11-26 (×7): qty 1

## 2014-11-26 MED ORDER — HYDROXYZINE HCL 25 MG PO TABS
50.0000 mg | ORAL_TABLET | ORAL | Status: DC | PRN
  Administered 2014-11-26 – 2014-11-30 (×6): 50 mg via ORAL
  Filled 2014-11-26 (×6): qty 1

## 2014-11-26 MED ORDER — GABAPENTIN 400 MG PO CAPS
400.0000 mg | ORAL_CAPSULE | Freq: Three times a day (TID) | ORAL | Status: DC
  Administered 2014-11-26 – 2014-11-27 (×2): 400 mg via ORAL
  Filled 2014-11-26 (×7): qty 1

## 2014-11-26 NOTE — Plan of Care (Signed)
Problem: Diagnosis: Increased Risk For Suicide Attempt Goal: LTG-Patient Will Show Positive Response to Medication LTG (by discharge) : Patient will show positive response to medication and will participate in the development of the discharge plan.  Outcome: Progressing Pt doing better this shift with addition of Soma

## 2014-11-26 NOTE — [facility] Group Notes (Signed)
[facility] Group Notes: (Clinical Social Work)   11/26/2014      Type of Therapy:  Group Therapy   Participation Level:  Did Not Attend despite MHT prompting   Jaydyn Menon Grossman-Orr, LCSW 11/26/2014, 12:29 PM     

## 2014-11-26 NOTE — Progress Notes (Signed)
Patient ID: Summer Estrada, female   DOB: 06-06-1960, 54 y.o.   MRN: [id number] [facility] MD Progress Note  11/26/2014 2:30 PM RAPHAEL ESPE  MRN:  [id number]  Subjective: Summer Estrada is moaning & groaning, "I hurt all over, my stomach is cramping, I feel nauseated. My back is hurting. I have bad anxiety. I don't know what to do. I feel like I'm going to throw-up (Pt gagging)"  Objective: Summer Estrada is seen, chart reviewed. She is admitted to the unit for worsening depression leading to suicide attempts by overdose. Summer Estrada is still reporting worsening symptoms of depression, anxiety & severe substance withdrawal symptoms. She says she stays in pain. Uses cocaine to numb the pain. Is hoping to get into a substance abuse treatment center or she will be discharged to streets, risking relapse again. Summer Estrada walks with a cane.She most of the day in bed. Her medications has been adjusted to meet her needs.  Principal Problem: Cocaine abuse, continuous use  Diagnosis:   Patient Active Problem List   Diagnosis Date Noted  . Cocaine abuse, continuous use [F14.10] 11/24/2014  . Low back pain [M54.5] 04/21/2013  . Hyperlipidemia [E78.5]   . GERD (gastroesophageal reflux disease) [K21.9]   . Arthritis [M19.90]   . Knee pain [M25.569]   . Wears dentures [Z98.811]   . Chronic pain [G89.29]   . Wears glasses [Z97.3]   . Trochanteric bursitis of right hip [M70.61] 06/22/2012  . Multinodular goiter [E04.2] 05/19/2012  . Medial meniscus tear [S83.249A] 05/07/2012  . Smoker [Z72.0] 04/14/2012  . Eczema [L30.9] 04/14/2012  . Hyperthyroidism [E05.90] 04/14/2012  . Hypotension [I95.9] 04/14/2012  . Cervical spinal stenosis [M48.02] 03/18/2012  . Insomnia [G47.00] 02/20/2012  . Osteoarthritis of both knees [M17.0] 08/08/2011  . Anxiety and depression [F41.8] 08/08/2011  . Cervical spondylosis [M47.812] 06/23/2011  . Patellar tendonitis [M76.50] 06/23/2011  . Myalgia and myositis, unspecified [M79.1, M60.9]  06/23/2011  . Calcifying tendinitis of shoulder [M75.30] 06/23/2011   Total Time spent with patient: 20 minutes  Past Medical History:  Past Medical History  Diagnosis Date  . Knee pain   . Chronic pain   . Wears glasses   . Wears dentures     upper  . GERD (gastroesophageal reflux disease)     occ  . Depression   . Arthritis   . Hyperlipidemia   . Anxiety     Past Surgical History  Procedure Laterality Date  . Total hip arthroplasty  2010    lt hip  . Cesarean section      x2  . Tubal ligation     Family History:  Family History  Problem Relation Age of Onset  . Kidney disease Mother   . Heart disease Father    Social History:  History  Alcohol Use No     History  Drug Use  . Yes  . Special: Cocaine    Social History   Social History  . Marital Status: Widowed    Spouse Name: N/A  . Number of Children: N/A  . Years of Education: N/A   Social History Main Topics  . Smoking status: Current Every Day Smoker -- 1.50 packs/day  . Smokeless tobacco: Never Used  . Alcohol Use: No  . Drug Use: Yes    Special: Cocaine  . Sexual Activity: Yes    Birth Control/ Protection: Post-menopausal     Comment: none   Other Topics Concern  . None   Social History Narrative   WIDOW  CHILDREN; ADULT GIRLS; 3   DISABLED   Additional History:    Sleep: Good  Appetite:  Fair  Assessment: Major depressive disorder, recurrent episodes, Cocaine use disorder, severe, dependence  Musculoskeletal: Strength & Muscle Tone: within normal limits Gait & Station: unsteady Patient leans: Walks with a cane  Psychiatric Specialty Exam: Physical Exam  Review of Systems  Constitutional: Positive for malaise/fatigue.  Eyes: Negative.   Respiratory: Negative.   Cardiovascular: Negative.   Gastrointestinal: Negative.   Genitourinary: Negative.   Musculoskeletal: Positive for myalgias, back pain, joint pain and neck pain.  Skin: Negative.   Neurological: Positive for  weakness.  Endo/Heme/Allergies: Negative.   Psychiatric/Behavioral: Positive for depression, suicidal ideas and substance abuse (Cocaine dependence). Negative for hallucinations and memory loss. The patient is nervous/anxious and has insomnia.     Blood pressure 107/78, pulse 72, temperature 100.5 F (38.1 C), temperature source Oral, resp. rate 18, height 5\' 3"  (1.6 m), weight 75.297 kg (166 lb), SpO2 99 %.Body mass index is 29.41 kg/(m^2).  General Appearance: Casual, works with a Customer service manager::  Good  Speech:  Clear and Coherent  Volume:  Normal  Mood:  Anxious, Depressed and Hopeless  Affect:  Congruent and Tearful  Thought Process:  Coherent and Goal Directed  Orientation:  Full (Time, Place, and Person)  Thought Content:  Rumination and denies hallucinations  Suicidal Thoughts:  No  Homicidal Thoughts:  No  Memory:  Grossly intact  Judgement:  Fair  Insight:  Present  Psychomotor Activity:  Restlessness and uncomfortable  Concentration:  Fair  Recall:  Good  Fund of Knowledge:Fair  Language: Good  Akathisia:  No  Handed:  Right  AIMS (if indicated):     Assets:  Communication Skills Desire for Improvement  ADL's:  Intact  Cognition: WNL  Sleep:  Number of Hours: 5   Current Medications: Current Facility-Administered Medications  Medication Dose Route Frequency Provider Last Rate Last Dose  . acetaminophen (TYLENOL) tablet 650 mg  650 mg Oral Q6H PRN Sanjuana Kava, NP   650 mg at 11/26/14 0452  . alum & mag hydroxide-simeth (MAALOX/MYLANTA) 200-200-20 MG/5ML suspension 30 mL  30 mL Oral Q4H PRN Sanjuana Kava, NP      . atorvastatin (LIPITOR) tablet 20 mg  20 mg Oral Daily Sanjuana Kava, NP   20 mg at 11/25/14 0754  . carisoprodol (SOMA) tablet 350 mg  350 mg Oral Q8H PRN Sanjuana Kava, NP   350 mg at 11/26/14 0452  . [START ON 11/27/2014] DULoxetine (CYMBALTA) DR capsule 40 mg  40 mg Oral Daily Sanjuana Kava, NP      . famotidine (PEPCID) tablet 10 mg  10 mg Oral  Daily Sanjuana Kava, NP   10 mg at 11/25/14 0754  . gabapentin (NEURONTIN) capsule 400 mg  400 mg Oral TID WC & HS Sanjuana Kava, NP      . hydrOXYzine (ATARAX/VISTARIL) tablet 50 mg  50 mg Oral Q4H PRN Sanjuana Kava, NP      . magnesium hydroxide (MILK OF MAGNESIA) suspension 30 mL  30 mL Oral Daily PRN Sanjuana Kava, NP      . meloxicam (MOBIC) tablet 15 mg  15 mg Oral Daily Sanjuana Kava, NP   15 mg at 11/26/14 1213  . nicotine (NICODERM CQ - dosed in mg/24 hours) patch 21 mg  21 mg Transdermal Daily Rachael Fee, MD   21 mg at 11/25/14 0753  .  nitrofurantoin (macrocrystal-monohydrate) (MACROBID) capsule 100 mg  100 mg Oral Q12H Craige Cotta, MD   100 mg at 11/25/14 1952  . potassium chloride SA (K-DUR,KLOR-CON) CR tablet 20 mEq  20 mEq Oral BID Craige Cotta, MD   20 mEq at 11/25/14 1636  . traZODone (DESYREL) tablet 100 mg  100 mg Oral QHS Sanjuana Kava, NP        Lab Results:  No results found for this or any previous visit (from the past 48 hour(s)).  Physical Findings: AIMS: Facial and Oral Movements Muscles of Facial Expression: None, normal Lips and Perioral Area: None, normal Jaw: None, normal Tongue: None, normal,Extremity Movements Upper (arms, wrists, hands, fingers): None, normal Lower (legs, knees, ankles, toes): None, normal, Trunk Movements Neck, shoulders, hips: None, normal, Overall Severity Severity of abnormal movements (highest score from questions above): None, normal Incapacitation due to abnormal movements: None, normal Patient's awareness of abnormal movements (rate only patient's report): No Awareness, Dental Status Current problems with teeth and/or dentures?: No Does patient usually wear dentures?: Yes (upper dentures)  CIWA:  CIWA-Ar Total: 3 COWS:  COWS Total Score: 3  Treatment Plan Summary: Daily contact with patient to assess and evaluate symptoms and progress in treatment and Medication management:   Plan: Supportive approach/coping  skills/relapse prevention           Depression: will increase Cymbalta to 40 mg and continue to work with mindfulness, CBT help  identify the cognitive distortions that keep the depression going          Discuss other life style changes that can help with both his depression and her cocaine use such like exercise, meditation           Cocaine dependence: will continue to use motivational interviewing and encourage to pursue total abstinence.           Insomnia; increased Trazodone 100 mg.           Continue Pepcid for Gerd, Meloxicam 15 mg for arthritis pain, Lipitor for high cholesterol, Kdur 20 Meq for low potassium & Macrobid for UTI.            Nicotine cravings; continue nicotine patch 21 mg.           Severe Agitation/chronic pain; increase the Neurontin to 400 mg Qid           Social worker to work on Building surveyor Making:  Review of Psycho-Social Stressors (1), Review of Last Therapy Session (1), Review of Medication Regimen & Side Effects (2) and Review of New Medication or Change in Dosage (2)  Sanjuana Kava, PMHNP, FNP-BC 11/26/2014, 2:30 PM  Reviewed the information documented and agree with the treatment plan.  Kadir Azucena,JANARDHAHA R. 11/26/2014 4:42 PM

## 2014-11-26 NOTE — Progress Notes (Signed)
Patient did not attend the evening speaker AA meeting. Pt was notified that group was beginning but remained in bed.   

## 2014-11-26 NOTE — Progress Notes (Signed)
Patient ID: Summer Estrada, female   DOB: Mar 28, 1960, 54 y.o.   MRN: [id number]   D: Pt has been very flat and depressed on the unit today. Pt did not attend any groups nor did she engage in treatment. Pt reported that she was sick and that she did not feel good. Pt refused all morning medications, she did wake up after lunch and requested medication for pain all over. Pt reported being negative SI/HI, no AH/VH noted. A: 15 min checks continued for patient safety. R: Pt safety maintained.

## 2014-11-26 NOTE — [facility] Group Notes (Signed)
[facility] Group Notes:  (Nursing/MHT/Case Management/Adjunct)  Date:  11/26/2014  Time:  11:26 AM  Type of Therapy:  Psychoeducational Skills  Participation Level:  Did Not Attend  Participation Quality:  Did Not Attend  Affect:  Did Not Attend  Cognitive:  Did Not Attend  Insight:  None  Engagement in Group:  Did Not Attend  Modes of Intervention:  Did Not Attend  Summary of Progress/Problems: Pt did not attend patient self inventory group.   Jacquelyne Balint Shanta 11/26/2014, 11:26 AM

## 2014-11-27 ENCOUNTER — Telehealth (HOSPITAL_COMMUNITY): Payer: Self-pay

## 2014-11-27 ENCOUNTER — Encounter (HOSPITAL_COMMUNITY): Payer: Self-pay | Admitting: Psychiatry

## 2014-11-27 DIAGNOSIS — F411 Generalized anxiety disorder: Secondary | ICD-10-CM

## 2014-11-27 DIAGNOSIS — F331 Major depressive disorder, recurrent, moderate: Secondary | ICD-10-CM | POA: Diagnosis present

## 2014-11-27 DIAGNOSIS — F1424 Cocaine dependence with cocaine-induced mood disorder: Secondary | ICD-10-CM

## 2014-11-27 MED ORDER — ONDANSETRON 4 MG PO TBDP
4.0000 mg | ORAL_TABLET | Freq: Three times a day (TID) | ORAL | Status: DC | PRN
  Administered 2014-11-27 – 2014-11-28 (×2): 4 mg via ORAL
  Filled 2014-11-27 (×2): qty 1

## 2014-11-27 MED ORDER — DULOXETINE HCL 60 MG PO CPEP
60.0000 mg | ORAL_CAPSULE | Freq: Every day | ORAL | Status: DC
  Administered 2014-11-28 – 2014-11-30 (×3): 60 mg via ORAL
  Filled 2014-11-27 (×5): qty 1

## 2014-11-27 MED ORDER — TRAMADOL HCL 50 MG PO TABS
50.0000 mg | ORAL_TABLET | Freq: Four times a day (QID) | ORAL | Status: DC
  Administered 2014-11-27 – 2014-11-30 (×13): 50 mg via ORAL
  Filled 2014-11-27 (×13): qty 1

## 2014-11-27 MED ORDER — GABAPENTIN 300 MG PO CAPS
600.0000 mg | ORAL_CAPSULE | Freq: Three times a day (TID) | ORAL | Status: DC
  Administered 2014-11-27 – 2014-12-01 (×17): 600 mg via ORAL
  Filled 2014-11-27 (×25): qty 2

## 2014-11-27 NOTE — [facility] Group Notes (Signed)
[facility] LCSW Group Therapy  11/27/2014 12:36 PM  Type of Therapy:  Group Therapy  Participation Level:  Did Not Attend-pt chose to remain in bed.   Modes of Intervention:  Confrontation, Discussion, Education, Exploration, Problem-solving, Rapport Building, Socialization and Support  Summary of Progress/Problems: Today's Topic: Overcoming Obstacles. Patients identified one short term goal and potential obstacles in reaching this goal. Patients processed barriers involved in overcoming these obstacles. Patients identified steps necessary for overcoming these obstacles and explored motivation (internal and external) for facing these difficulties head on.   Smart, Samwise Eckardt LCSWA  11/27/2014, 12:36 PM

## 2014-11-27 NOTE — Plan of Care (Signed)
Problem: Diagnosis: Increased Risk For Suicide Attempt Goal: STG-Patient Will Comply With Medication Regime Outcome: Progressing Patient compliant with medications at this time.     

## 2014-11-27 NOTE — Progress Notes (Signed)
[facility] MD Progress Note  11/27/2014 5:33 PM Summer Estrada  MRN:  [id number] Subjective:  States she is on severe pain. As she comes off the cocaine the pain she claims is worst. She has been using cocaine to deal with her pain. She has not been able to be seen at a pain clinic since she had a "dirty urine." she has been told she needs surgery but she does not have the means to to trough it. She states that got so upset when thinking the type of life she was waiting for her that she started thinking about killing herself. She is pretty much on her own. States she has no one out there that would be willing to help her Principal Problem: <principal problem not specified> Diagnosis:   Patient Active Problem List   Diagnosis Date Noted  . Cocaine abuse, continuous use [F14.10] 11/24/2014  . Low back pain [M54.5] 04/21/2013  . Hyperlipidemia [E78.5]   . GERD (gastroesophageal reflux disease) [K21.9]   . Arthritis [M19.90]   . Knee pain [M25.569]   . Wears dentures [Z98.811]   . Chronic pain [G89.29]   . Wears glasses [Z97.3]   . Trochanteric bursitis of right hip [M70.61] 06/22/2012  . Multinodular goiter [E04.2] 05/19/2012  . Medial meniscus tear [S83.249A] 05/07/2012  . Smoker [Z72.0] 04/14/2012  . Eczema [L30.9] 04/14/2012  . Hyperthyroidism [E05.90] 04/14/2012  . Hypotension [I95.9] 04/14/2012  . Cervical spinal stenosis [M48.02] 03/18/2012  . Insomnia [G47.00] 02/20/2012  . Osteoarthritis of both knees [M17.0] 08/08/2011  . Anxiety and depression [F41.8] 08/08/2011  . Cervical spondylosis [M47.812] 06/23/2011  . Patellar tendonitis [M76.50] 06/23/2011  . Myalgia and myositis, unspecified [M79.1, M60.9] 06/23/2011  . Calcifying tendinitis of shoulder [M75.30] 06/23/2011   Total Time spent with patient: 30 minutes   Past Medical History:  Past Medical History  Diagnosis Date  . Knee pain   . Chronic pain   . Wears glasses   . Wears dentures     upper  . GERD  (gastroesophageal reflux disease)     occ  . Depression   . Arthritis   . Hyperlipidemia   . Anxiety     Past Surgical History  Procedure Laterality Date  . Total hip arthroplasty  2010    lt hip  . Cesarean section      x2  . Tubal ligation     Family History:  Family History  Problem Relation Age of Onset  . Kidney disease Mother   . Heart disease Father    Social History:  History  Alcohol Use No     History  Drug Use  . Yes  . Special: Cocaine    Social History   Social History  . Marital Status: Widowed    Spouse Name: N/A  . Number of Children: N/A  . Years of Education: N/A   Social History Main Topics  . Smoking status: Current Every Day Smoker -- 1.50 packs/day  . Smokeless tobacco: Never Used  . Alcohol Use: No  . Drug Use: Yes    Special: Cocaine  . Sexual Activity: Yes    Birth Control/ Protection: Post-menopausal     Comment: none   Other Topics Concern  . None   Social History Narrative   WIDOW   CHILDREN; ADULT GIRLS; 3   DISABLED   Additional History:    Sleep: Poor  Appetite:  Poor   Assessment:   Musculoskeletal: Strength & Muscle Tone: within normal limits Gait &  Station: affected by pain in her hip and knee Patient leans: normal   Psychiatric Specialty Exam: Physical Exam  ROS  Blood pressure 98/76, pulse 84, temperature 97.9 F (36.6 C), temperature source Oral, resp. rate 20, height  (1.6 m), weight 75.297 kg (166 lb), SpO2 99 %.Body mass index is 29.41 kg/(m^2).  General Appearance: Fairly Groomed  Patent attorney::  Fair  Speech:  Clear and Coherent  Volume:  fluctuates  Mood:  Anxious, Depressed, Dysphoric and irritable in pain  Affect:  Labile  Thought Process:  Coherent and Goal Directed  Orientation:  Full (Time, Place, and Person)  Thought Content:  symptoms events worries concerns  Suicidal Thoughts:  No  Homicidal Thoughts:  No  Memory:  Immediate;   Fair Recent;   Fair Remote;   Fair  Judgement:   Fair  Insight:  Present  Psychomotor Activity:  Restlessness  Concentration:  Fair  Recall:  [employer] of Knowledge:Fair  Language: Fair  Akathisia:  No  Handed:  Right  AIMS (if indicated):     Assets:  Desire for Improvement  ADL's:  Intact  Cognition: WNL  Sleep:  Number of Hours: 5.75     Current Medications: Current Facility-Administered Medications  Medication Dose Route Frequency Provider Last Rate Last Dose  . acetaminophen (TYLENOL) tablet 650 mg  650 mg Oral Q6H PRN Sanjuana Kava, NP   650 mg at 11/27/14 0304  . alum & mag hydroxide-simeth (MAALOX/MYLANTA) 200-200-20 MG/5ML suspension 30 mL  30 mL Oral Q4H PRN Sanjuana Kava, NP      . atorvastatin (LIPITOR) tablet 20 mg  20 mg Oral Daily Sanjuana Kava, NP   20 mg at 11/27/14 0845  . carisoprodol (SOMA) tablet 350 mg  350 mg Oral Q8H PRN Sanjuana Kava, NP   350 mg at 11/27/14 0855  . [START ON 11/28/2014] DULoxetine (CYMBALTA) DR capsule 60 mg  60 mg Oral Daily Rachael Fee, MD      . famotidine (PEPCID) tablet 10 mg  10 mg Oral Daily Sanjuana Kava, NP   10 mg at 11/27/14 0844  . gabapentin (NEURONTIN) capsule 600 mg  600 mg Oral TID WC & HS Rachael Fee, MD   600 mg at 11/27/14 1624  . hydrOXYzine (ATARAX/VISTARIL) tablet 50 mg  50 mg Oral Q4H PRN Sanjuana Kava, NP   50 mg at 11/27/14 0304  . magnesium hydroxide (MILK OF MAGNESIA) suspension 30 mL  30 mL Oral Daily PRN Sanjuana Kava, NP      . meloxicam (MOBIC) tablet 15 mg  15 mg Oral Daily Sanjuana Kava, NP   15 mg at 11/27/14 0846  . nicotine (NICODERM CQ - dosed in mg/24 hours) patch 21 mg  21 mg Transdermal Daily Rachael Fee, MD   21 mg at 11/27/14 0844  . nitrofurantoin (macrocrystal-monohydrate) (MACROBID) capsule 100 mg  100 mg Oral Q12H Rockey Situ Cobos, MD   100 mg at 11/27/14 0845  . ondansetron (ZOFRAN-ODT) disintegrating tablet 4 mg  4 mg Oral Q8H PRN Rachael Fee, MD   4 mg at 11/27/14 1226  . traMADol (ULTRAM) tablet 50 mg  50 mg Oral 4 times per day  Rachael Fee, MD   50 mg at 11/27/14 1726  . traZODone (DESYREL) tablet 100 mg  100 mg Oral QHS Sanjuana Kava, NP   100 mg at 11/26/14 2200    Lab Results: No results found for  this or any previous visit (from the past 48 hour(s)).  Physical Findings: AIMS: Facial and Oral Movements Muscles of Facial Expression: None, normal Lips and Perioral Area: None, normal Jaw: None, normal Tongue: None, normal,Extremity Movements Upper (arms, wrists, hands, fingers): None, normal Lower (legs, knees, ankles, toes): None, normal, Trunk Movements Neck, shoulders, hips: None, normal, Overall Severity Severity of abnormal movements (highest score from questions above): None, normal Incapacitation due to abnormal movements: None, normal Patient's awareness of abnormal movements (rate only patient's report): No Awareness, Dental Status Current problems with teeth and/or dentures?: No Does patient usually wear dentures?: Yes (upper dentures)  CIWA:  CIWA-Ar Total: 3 COWS:  COWS Total Score: 3  Treatment Plan Summary: Daily contact with patient to assess and evaluate symptoms and progress in treatment and Medication management Supportive approach/coping skills Cocaine dependence ;work a relapse prevention plan Pain; will use Tramadol 50 mg Q 6 PRN and continue the Mobic/Soma Will continue to use the Neurontin 600 mg  Depression/anxiety/pain; will increase the Cymbalta to 60 mg daily CBT/mindfulness Medical Decision Making:  Review of Psycho-Social Stressors (1), Review or order clinical lab tests (1), Review of Medication Regimen & Side Effects (2) and Review of New Medication or Change in Dosage (2)     Summer Estrada A 11/27/2014, 5:33 PM
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
## 2014-11-27 NOTE — Progress Notes (Signed)
Patient ID: Summer Estrada, female   DOB: 04/16/1960, 54 y.o.   MRN: [id number]  DAR: Pt. Denies SI/HI and A/V Hallucinations. Patient appears anxious and depressed. Patient reports her pain is "10 OUT OF 10!" Patient received PRN Soma and patient was transitioned to scheduled Tramadol per MD order. Support and encouragement provided to the patient. Patient is reclusive and stays mainly in her room in bed which patient reports is due to the pain. Patient states, "is it because of the withdrawals?" Patient intermittently makes groaning sounds however after retreating to her room and laying in her bed it tends to stop when patient is not aware that she is observed. Q15 minute checks are maintained for safety.

## 2014-11-27 NOTE — Progress Notes (Signed)
Recreation Therapy Notes  Date: 09.12.2016 Time: 9:30am Location: [address] Group room   Group Topic: Stress Management  Goal Area(s) Addresses:  Patient will actively participate in stress management techniques presented during session.   Behavioral Response: Did not attend.   Dilynn Munroe L Agamjot Kilgallon, LRT/CTRS  Astha Probasco L 11/27/2014 11:41 AM 

## 2014-11-27 NOTE — [facility] Group Notes (Signed)
[facility] LCSW Aftercare Discharge Planning Group Note   11/27/2014 10:55 AM  Participation Quality:  Invited-DID NOT ATTEND. Pt reports that she wants to remain in bed. Feeling sick. MD notified.   Smart, [employer]

## 2014-11-27 NOTE — Telephone Encounter (Signed)
Post ED Visit - Positive Culture Follow-up  Culture report reviewed by antimicrobial stewardship pharmacist:   Celedonio Miyamoto, Pharm.D., BCPS  Georgina Pillion, [address].D., BCPS  [place], [address].D., BCPS, AAHIVP  Estella Husk, Pharm.D., BCPS, AAHIVP  [employer], [address].D.  Cassie Meacham, [place].D.  Positive urine culture Treated with macrobid, organism sensitive to the same and no further patient follow-up is required at this time.  [name] 11/27/2014, 9:13 AM

## 2014-11-27 NOTE — Progress Notes (Signed)
D. Pt had been visible in milieu however has remained in room and in bed for much of the evening. Pt has appeared flat and depressed and spoke about how she's been in pain and had requested and received medications for such. Pt has been seen in bed for much of the remainder of the shift and has not verbalized any other complaints. A. Support and encouragement provided. R. Safety maintained, will continue to monitor.

## 2014-11-28 MED ORDER — GUAIFENESIN 100 MG/5ML PO SYRP
200.0000 mg | ORAL_SOLUTION | Freq: Four times a day (QID) | ORAL | Status: DC | PRN
  Administered 2014-11-28 – 2014-11-29 (×3): 200 mg via ORAL
  Filled 2014-11-28 (×4): qty 10

## 2014-11-28 MED ORDER — ALBUTEROL SULFATE HFA 108 (90 BASE) MCG/ACT IN AERS
2.0000 | INHALATION_SPRAY | Freq: Four times a day (QID) | RESPIRATORY_TRACT | Status: DC | PRN
Start: 2014-11-28 — End: 2014-12-01
  Administered 2014-11-28 – 2014-11-30 (×3): 2 via RESPIRATORY_TRACT
  Filled 2014-11-28: qty 6.7

## 2014-11-28 NOTE — Progress Notes (Signed)
Patient ID: Summer Estrada, female   DOB: 04-20-1960, 54 y.o.   MRN: [id number]  DAR: Pt. Denies HI and A/V Hallucinations to this writer but does continue to endorse SI. She is able to contract for safety and is not expressing self harm to Clinical research associate. She reports her sleep is poor, appetite is poor, and energy level is low. She rates her depression 10/10, hopelessness 10/10, and anxiety 5-6/10. Patient continues to report pain and is receiving both scheduled and PRN pain medication. Support and encouragement provided to the patient. Scheduled medications administered to patient per physician's orders and PRN administered to patient for various reports of pain, nausea, and respiratory related issues. Patient was initially irritable this morning however has become more pleasant throughout the day. She reports she is in better spirits due to potentially being able to keep her home. She states her brother is supposed to help. However, she reports, "I don't want to get my hopes up." Q15 minute checks are maintained for safety.

## 2014-11-28 NOTE — Progress Notes (Signed)
Pt has been up and in the dayroom most of the evening.  She reports she is still in pain, but that she wants to be up and participating in the groups.  She states her family is not supportive and that her youngest daughter recently moved across the country.  She says she has been using cocaine to deal with her chronic pain, and her family does not approve of her using drugs.  She is now homeless, because she was recently evicted from her home.  Her husband died 3 yrs ago, and she is still grieving.  Pt says she feels hopeless that there does not seem to be an answer to her situation.  Pt was tearful during our conversation.  Support and encouragement offered.  Pt encouraged to speak to her CSW tomorrow about her concerns.  Pt encouraged to give the med changes that were made today a chance to work.  Medications given as ordered by the MD.  Pt was also given heat packs at bedtime.  Safety maintained with q15 minute checks.

## 2014-11-28 NOTE — B[facility] Group Notes (Signed)
[facility] LCSW Group Therapy  11/28/2014 12:36 PM  Type of Therapy:  Group Therapy  Participation Level:  Active  Participation Quality:  Attentive  Affect:  Appropriate  Cognitive:  Alert and Oriented  Insight:  Engaged  Engagement in Therapy:  Engaged  Modes of Intervention:  Discussion, Education, Exploration, Problem-solving, Rapport Building, Socialization and Support  Summary of Progress/Problems: MHA Speaker came to talk about his personal journey with substance abuse and addiction. The pt processed ways by which to relate to the speaker. MHA speaker provided handouts and educational information pertaining to groups and services offered by the [facility].   Smart, Ruthia Person  LCSWA   11/28/2014, 12:36 PM

## 2014-11-28 NOTE — Progress Notes (Signed)
[facility] Group Notes:  (Nursing/MHT/Case Management/Adjunct)  Date:  11/28/2014  Time:  2:12 PM  Type of Therapy:  Nurse Education  Participation Level:  Active  Participation Quality:  Appropriate  Affect:  Appropriate  Cognitive:  Appropriate  Insight:  Appropriate  Engagement in Group:  Engaged  Modes of Intervention:  Discussion and Education  Summary of Progress/Problems: Patient attended nursing education group and was engaged.   Sebastien Jackson E  

## 2014-11-28 NOTE — Progress Notes (Signed)
Pt attended spiritual care group on grief and loss facilitated by chaplain Damira Kem   Group opened with brief discussion and psycho-social ed around grief and loss in relationships and in relation to self - identifying life patterns, circumstances, changes that cause losses. Established group norm of speaking from own life experience. Group goal of establishing open and affirming space for members to share loss and experience with grief, normalize grief experience and provide psycho social education and grief support.     

## 2014-11-28 NOTE — Plan of Care (Signed)
Problem: Alteration in mood Goal: STG-Patient reports thoughts of self-harm to staff Outcome: Progressing She reports SI, able to contract for safety at this time.

## 2014-11-28 NOTE — Progress Notes (Signed)
[facility] MD Progress Note  11/28/2014 5:24 PM Summer Estrada  MRN:  [id number] Subjective:  Shaasia continues to withdraw from the cocaine. States as she comes off the cocaine the pain is exacerbating. Admits to a sense of hopelessness helpless but she does not want to give up. She sees herself as all alone giving this fight. States she does not blame anyone but herself as  she has burned a lot of bridges. States that the Tramadol/Soma seem to offer some relief but does not take care of all the pain. She states she would like to go to a residential treatment facility as she does not trust she can abstain without any further help.  Principal Problem: Cocaine dependence with cocaine-induced mood disorder Diagnosis:   Patient Active Problem List   Diagnosis Date Noted  . Cocaine dependence with cocaine-induced mood disorder [F14.24] 11/27/2014  . GAD (generalized anxiety disorder) [F41.1] 11/27/2014  . Depression, major, recurrent, moderate [F33.1] 11/27/2014  . Cocaine abuse, continuous use [F14.10] 11/24/2014  . Low back pain [M54.5] 04/21/2013  . Hyperlipidemia [E78.5]   . GERD (gastroesophageal reflux disease) [K21.9]   . Arthritis [M19.90]   . Knee pain [M25.569]   . Wears dentures [Z98.811]   . Chronic pain [G89.29]   . Wears glasses [Z97.3]   . Trochanteric bursitis of right hip [M70.61] 06/22/2012  . Multinodular goiter [E04.2] 05/19/2012  . Medial meniscus tear [S83.249A] 05/07/2012  . Smoker [Z72.0] 04/14/2012  . Eczema [L30.9] 04/14/2012  . Hyperthyroidism [E05.90] 04/14/2012  . Hypotension [I95.9] 04/14/2012  . Cervical spinal stenosis [M48.02] 03/18/2012  . Insomnia [G47.00] 02/20/2012  . Osteoarthritis of both knees [M17.0] 08/08/2011  . Anxiety and depression [F41.8] 08/08/2011  . Cervical spondylosis [M47.812] 06/23/2011  . Patellar tendonitis [M76.50] 06/23/2011  . Myalgia and myositis, unspecified [M79.1, M60.9] 06/23/2011  . Calcifying tendinitis of shoulder [M75.30]  06/23/2011   Total Time spent with patient: 30 minutes   Past Medical History:  Past Medical History  Diagnosis Date  . Knee pain   . Chronic pain   . Wears glasses   . Wears dentures     upper  . GERD (gastroesophageal reflux disease)     occ  . Depression   . Arthritis   . Hyperlipidemia   . Anxiety     Past Surgical History  Procedure Laterality Date  . Total hip arthroplasty  2010    lt hip  . Cesarean section      x2  . Tubal ligation     Family History:  Family History  Problem Relation Age of Onset  . Kidney disease Mother   . Heart disease Father    Social History:  History  Alcohol Use No     History  Drug Use  . Yes  . Special: Cocaine    Social History   Social History  . Marital Status: Widowed    Spouse Name: N/A  . Number of Children: N/A  . Years of Education: N/A   Social History Main Topics  . Smoking status: Current Every Day Smoker -- 1.50 packs/day  . Smokeless tobacco: Never Used  . Alcohol Use: No  . Drug Use: Yes    Special: Cocaine  . Sexual Activity: Yes    Birth Control/ Protection: Post-menopausal     Comment: none   Other Topics Concern  . None   Social History Narrative   WIDOW   CHILDREN; ADULT GIRLS; 3   DISABLED   Additional History:  Sleep: Fair  Appetite:  Fair   Assessment:   Musculoskeletal: Strength & Muscle Tone: within normal limits Gait & Station: affected by her hip-knee pain Patient leans: normal   Psychiatric Specialty Exam: Physical Exam  Review of Systems  Constitutional: Positive for malaise/fatigue.  Respiratory: Negative.   Gastrointestinal: Negative.   Genitourinary: Negative.   Musculoskeletal: Positive for back pain and joint pain.  Neurological: Positive for weakness.  Endo/Heme/Allergies: Negative.   Psychiatric/Behavioral: Positive for depression and substance abuse. The patient is nervous/anxious.     Blood pressure 100/63, pulse 84, temperature 97.7 F (36.5 C),  temperature source Oral, resp. rate 16, height 5\' 3"  (1.6 m), weight 75.297 kg (166 lb), SpO2 99 %.Body mass index is 29.41 kg/(m^2).  General Appearance: Fairly Groomed  Patent attorney::  Fair  Speech:  Clear and Coherent  Volume:  Normal  Mood:  Anxious, Depressed and in pain  Affect:  Labile  Thought Process:  Coherent and Goal Directed  Orientation:  Full (Time, Place, and Person)  Thought Content:  symptoms events worries concerns  Suicidal Thoughts:  No  Homicidal Thoughts:  No  Memory:  Immediate;   Fair Recent;   Fair Remote;   Fair  Judgement:  Fair  Insight:  Present and Shallow  Psychomotor Activity:  Restlessness  Concentration:  Fair  Recall:  [employer] of Knowledge:Fair  Language: Fair  Akathisia:  No  Handed:  Right  AIMS (if indicated):     Assets:  Desire for Improvement  ADL's:  Intact  Cognition: WNL  Sleep:  Number of Hours: 5.75     Current Medications: Current Facility-Administered Medications  Medication Dose Route Frequency Provider Last Rate Last Dose  . acetaminophen (TYLENOL) tablet 650 mg  650 mg Oral Q6H PRN Sanjuana Kava, NP   650 mg at 11/28/14 0258  . albuterol (PROVENTIL HFA;VENTOLIN HFA) 108 (90 BASE) MCG/ACT inhaler 2 puff  2 puff Inhalation Q6H PRN Rachael Fee, MD   2 puff at 11/28/14 [phone]  . alum & mag hydroxide-simeth (MAALOX/MYLANTA) 200-200-20 MG/5ML suspension 30 mL  30 mL Oral Q4H PRN Sanjuana Kava, NP      . atorvastatin (LIPITOR) tablet 20 mg  20 mg Oral Daily Sanjuana Kava, NP   20 mg at 11/28/14 0758  . carisoprodol (SOMA) tablet 350 mg  350 mg Oral Q8H PRN Sanjuana Kava, NP   350 mg at 11/28/14 0803  . DULoxetine (CYMBALTA) DR capsule 60 mg  60 mg Oral Daily Rachael Fee, MD   60 mg at 11/28/14 0758  . famotidine (PEPCID) tablet 10 mg  10 mg Oral Daily Sanjuana Kava, NP   10 mg at 11/28/14 0758  . gabapentin (NEURONTIN) capsule 600 mg  600 mg Oral TID WC & HS Rachael Fee, MD   600 mg at 11/28/14 1700  . guaifenesin  (ROBITUSSIN) 100 MG/5ML syrup 200 mg  200 mg Oral Q6H PRN Rachael Fee, MD   200 mg at 11/28/14 0955  . hydrOXYzine (ATARAX/VISTARIL) tablet 50 mg  50 mg Oral Q4H PRN Sanjuana Kava, NP   50 mg at 11/28/14 0258  . magnesium hydroxide (MILK OF MAGNESIA) suspension 30 mL  30 mL Oral Daily PRN Sanjuana Kava, NP      . meloxicam (MOBIC) tablet 15 mg  15 mg Oral Daily Sanjuana Kava, NP   15 mg at 11/28/14 0758  . nicotine (NICODERM CQ - dosed in mg/24 hours)  patch 21 mg  21 mg Transdermal Daily Rachael Fee, MD   21 mg at 11/28/14 0759  . nitrofurantoin (macrocrystal-monohydrate) (MACROBID) capsule 100 mg  100 mg Oral Q12H Rockey Situ Cobos, MD   100 mg at 11/28/14 0758  . ondansetron (ZOFRAN-ODT) disintegrating tablet 4 mg  4 mg Oral Q8H PRN Rachael Fee, MD   4 mg at 11/28/14 1322  . traMADol (ULTRAM) tablet 50 mg  50 mg Oral 4 times per day Rachael Fee, MD   50 mg at 11/28/14 1158  . traZODone (DESYREL) tablet 100 mg  100 mg Oral QHS Sanjuana Kava, NP   100 mg at 11/27/14 2137    Lab Results: No results found for this or any previous visit (from the past 48 hour(s)).  Physical Findings: AIMS: Facial and Oral Movements Muscles of Facial Expression: None, normal Lips and Perioral Area: None, normal Jaw: None, normal Tongue: None, normal,Extremity Movements Upper (arms, wrists, hands, fingers): None, normal Lower (legs, knees, ankles, toes): None, normal, Trunk Movements Neck, shoulders, hips: None, normal, Overall Severity Severity of abnormal movements (highest score from questions above): None, normal Incapacitation due to abnormal movements: None, normal Patient's awareness of abnormal movements (rate only patient's report): No Awareness, Dental Status Current problems with teeth and/or dentures?: No Does patient usually wear dentures?: Yes (upper dentures)  CIWA:  CIWA-Ar Total: 3 COWS:  COWS Total Score: 3  Treatment Plan Summary: Daily contact with patient to assess and evaluate  symptoms and progress in treatment and Medication management Supportive approach/coping skills Cocaine dependence; continue to work on her relapse prevention plan Mood instability; continue to assess for a mood stabilizer Depression/anxiey/will pursue the Cymbalta  And optimize dose-response Will explore residential treatment options Pain; will continue the Cymbalta/Neurontin combination and optimize response CBT/mindfulness  Medical Decision Making:  Review of Psycho-Social Stressors (1), Review or order clinical lab tests (1), Review of Medication Regimen & Side Effects (2) and Review of New Medication or Change in Dosage (2)     Ophie Burrowes A 11/28/2014, 5:24 PM

## 2014-11-29 MED ORDER — CARISOPRODOL 350 MG PO TABS
350.0000 mg | ORAL_TABLET | Freq: Four times a day (QID) | ORAL | Status: DC | PRN
  Administered 2014-11-29 – 2014-12-01 (×3): 350 mg via ORAL
  Filled 2014-11-29 (×3): qty 1

## 2014-11-29 NOTE — Progress Notes (Signed)
Patient attended N/A group meeting tonight. 

## 2014-11-29 NOTE — Progress Notes (Signed)
Recreation Therapy Notes  Date: 09.14.2016 Time: 9:30am  Location: [address] Group Room   Group Topic: Stress Management  Goal Area(s) Addresses:  Patient will actively participate in stress management techniques presented during session.   Behavioral Response: Did not attend.    Trinadee Verhagen L Chanique Duca, LRT/CTRS  Maysun Meditz L 11/29/2014 11:29 AM 

## 2014-11-29 NOTE — Progress Notes (Signed)
Pt has been in the dayroom most of the evening talking with peers.  At the beginning of the shift when writer was doing assessments, pt started the conversation with asking when she could get her next Reed.  She continues to be focused on pain and medications.  She also reported that she had her hopes up that her brother might help her financially as she also found out he lives close.  Then she said that as the day progressed, she found out that he probably would not help her.  "I'm just going to have to do this on my own."  Pt still having passive suicidal thoughts, but contracts for safety.  She denies HI/AVH.  She is receiving scheduled and prn pain medications.  She makes her needs known to staff.  Discharge plans are in process.  Safety maintained with q15 minute checks.

## 2014-11-29 NOTE — Progress Notes (Signed)
Patient ID: Summer Estrada, female   DOB: 09-30-1960, 54 y.o.   MRN: [id number]  DAR: Pt. Denies SI/HI and A/V Hallucinations to this writer during morning assessment and states that the last time she had SI was last night. Patient continues to report pain that increases with activity. She is continuing to ambulate with her cane on the unit. She reports sleep was fair last night, appetite is fair, energy level is low, and concentration level is good. She rates her depression 6/10, anxiety 6/10, and hopelessness 4/10. Support and encouragement provided to the patient. Scheduled medications administered to patient per physician's orders. PRN medications utilized as well for pain and congestion. Patient appears brighter in affect however states, "I look okay on the outside but feel bad on the inside."  Q15 minute checks are maintained for safety.

## 2014-11-29 NOTE — Progress Notes (Signed)
Summer Estrada  11/29/2014 4:37 PM Summer Estrada  MRN:  [id number] Subjective:  Summer Estrada states she is having a very hard time. States she thought that her brother was planning to help her but he will not. States the way he let her know was in a very cruel way. Woke up this morning feeling hopeless helpless with the thoughts that she would rather be dead. Admits that she knows that is all her own doing but she was trying to have a new start make amends to her family try to establish some sort of relationship with her children but now knows it is not going to happen Principal Problem: Cocaine dependence with cocaine-induced mood disorder Diagnosis:   Patient Active Problem List   Diagnosis Date Noted  . Cocaine dependence with cocaine-induced mood disorder [F14.24] 11/27/2014  . GAD (generalized anxiety disorder) [F41.1] 11/27/2014  . Depression, major, recurrent, moderate [F33.1] 11/27/2014  . Cocaine abuse, continuous use [F14.10] 11/24/2014  . Low back pain [M54.5] 04/21/2013  . Hyperlipidemia [E78.5]   . GERD (gastroesophageal reflux disease) [K21.9]   . Arthritis [M19.90]   . Knee pain [M25.569]   . Wears dentures [Z98.811]   . Chronic pain [G89.29]   . Wears glasses [Z97.3]   . Trochanteric bursitis of right hip [M70.61] 06/22/2012  . Multinodular goiter [E04.2] 05/19/2012  . Medial meniscus tear [S83.249A] 05/07/2012  . Smoker [Z72.0] 04/14/2012  . Eczema [L30.9] 04/14/2012  . Hyperthyroidism [E05.90] 04/14/2012  . Hypotension [I95.9] 04/14/2012  . Cervical spinal stenosis [M48.02] 03/18/2012  . Insomnia [G47.00] 02/20/2012  . Osteoarthritis of both knees [M17.0] 08/08/2011  . Anxiety and depression [F41.8] 08/08/2011  . Cervical spondylosis [M47.812] 06/23/2011  . Patellar tendonitis [M76.50] 06/23/2011  . Myalgia and myositis, unspecified [M79.1, M60.9] 06/23/2011  . Calcifying tendinitis of shoulder [M75.30] 06/23/2011   Total Time spent with patient: 30  minutes   Past Medical History:  Past Medical History  Diagnosis Date  . Knee pain   . Chronic pain   . Wears glasses   . Wears dentures     upper  . GERD (gastroesophageal reflux disease)     occ  . Depression   . Arthritis   . Hyperlipidemia   . Anxiety     Past Surgical History  Procedure Laterality Date  . Total hip arthroplasty  2010    lt hip  . Cesarean section      x2  . Tubal ligation     Family History:  Family History  Problem Relation Age of Onset  . Kidney disease Mother   . Heart disease Father    Social History:  History  Alcohol Use No     History  Drug Use  . Yes  . Special: Cocaine    Social History   Social History  . Marital Status: Widowed    Spouse Name: N/A  . Number of Children: N/A  . Years of Education: N/A   Social History Main Topics  . Smoking status: Current Every Day Smoker -- 1.50 packs/day  . Smokeless tobacco: Never Used  . Alcohol Use: No  . Drug Use: Yes    Special: Cocaine  . Sexual Activity: Yes    Birth Control/ Protection: Post-menopausal     Comment: none   Other Topics Concern  . None   Social History Narrative   WIDOW   CHILDREN; ADULT GIRLS; 3   DISABLED   Additional History:    Sleep: Fair  Appetite:  Poor   Assessment:   Musculoskeletal: Strength & Muscle Tone: within normal limits Gait & Station: affected by hip and knee pain Patient leans: normal   Psychiatric Specialty Exam: Physical Exam  Review of Systems  Constitutional: Positive for malaise/fatigue.  HENT: Negative.   Eyes: Negative.   Respiratory: Negative.   Cardiovascular: Negative.   Gastrointestinal: Negative.   Genitourinary: Negative.   Musculoskeletal: Positive for back pain and joint pain.  Skin: Negative.   Neurological: Negative.   Endo/Heme/Allergies: Negative.   Psychiatric/Behavioral: Positive for depression and substance abuse. The patient is nervous/anxious.     Blood pressure 101/76, pulse 99,  temperature 98.6 F (37 C), temperature source Oral, resp. rate 20, height 5\' 3"  (1.6 m), weight 75.297 kg (166 lb), SpO2 90 %.Body mass index is 29.41 kg/(m^2).  General Appearance: Fairly Groomed  Patent attorney::  Minimal  Speech:  Clear and Coherent and Slow  Volume:  Decreased  Mood:  Anxious and Depressed  Affect:  Depressed and Tearful  Thought Process:  Coherent and Goal Directed  Orientation:  Full (Time, Place, and Person)  Thought Content:  symptoms events worries concerns  Suicidal Thoughts:  Yes no plans or intent  Homicidal Thoughts:  No  Memory:  Immediate;   Fair Recent;   Fair Remote;   Fair  Judgement:  Fair  Insight:  Present  Psychomotor Activity:  Restlessness  Concentration:  Fair  Recall:  [employer] of Knowledge:Fair  Language: Fair  Akathisia:  No  Handed:  Right  AIMS (if indicated):     Assets:  Desire for Improvement  ADL's:  Intact  Cognition: WNL  Sleep:  Number of Hours: 6.75     Current Medications: Current Facility-Administered Medications  Medication Dose Route Frequency Provider Last Rate Last Dose  . acetaminophen (TYLENOL) tablet 650 mg  650 mg Oral Q6H PRN Sanjuana Kava, NP   650 mg at 11/28/14 0258  . albuterol (PROVENTIL HFA;VENTOLIN HFA) 108 (90 BASE) MCG/ACT inhaler 2 puff  2 puff Inhalation Q6H PRN Rachael Fee, MD   2 puff at 11/29/14 [phone]  . alum & mag hydroxide-simeth (MAALOX/MYLANTA) 200-200-20 MG/5ML suspension 30 mL  30 mL Oral Q4H PRN Sanjuana Kava, NP      . atorvastatin (LIPITOR) tablet 20 mg  20 mg Oral Daily Sanjuana Kava, NP   20 mg at 11/29/14 0826  . carisoprodol (SOMA) tablet 350 mg  350 mg Oral Q6H PRN Rachael Fee, MD      . DULoxetine (CYMBALTA) DR capsule 60 mg  60 mg Oral Daily Rachael Fee, MD   60 mg at 11/29/14 0825  . famotidine (PEPCID) tablet 10 mg  10 mg Oral Daily Sanjuana Kava, NP   10 mg at 11/29/14 0826  . gabapentin (NEURONTIN) capsule 600 mg  600 mg Oral TID WC & HS Rachael Fee, MD   600 mg at  11/29/14 1206  . guaifenesin (ROBITUSSIN) 100 MG/5ML syrup 200 mg  200 mg Oral Q6H PRN Rachael Fee, MD   200 mg at 11/29/14 0827  . hydrOXYzine (ATARAX/VISTARIL) tablet 50 mg  50 mg Oral Q4H PRN Sanjuana Kava, NP   50 mg at 11/28/14 2358  . magnesium hydroxide (MILK OF MAGNESIA) suspension 30 mL  30 mL Oral Daily PRN Sanjuana Kava, NP      . meloxicam (MOBIC) tablet 15 mg  15 mg Oral Daily Sanjuana Kava, NP   15 mg at  11/29/14 0825  . nicotine (NICODERM CQ - dosed in mg/24 hours) patch 21 mg  21 mg Transdermal Daily Rachael Fee, MD   21 mg at 11/29/14 0825  . nitrofurantoin (macrocrystal-monohydrate) (MACROBID) capsule 100 mg  100 mg Oral Q12H Craige Cotta, MD   100 mg at 11/29/14 0825  . ondansetron (ZOFRAN-ODT) disintegrating tablet 4 mg  4 mg Oral Q8H PRN Rachael Fee, MD   4 mg at 11/28/14 1322  . traMADol (ULTRAM) tablet 50 mg  50 mg Oral 4 times per day Rachael Fee, MD   50 mg at 11/29/14 1206  . traZODone (DESYREL) tablet 100 mg  100 mg Oral QHS Sanjuana Kava, NP   100 mg at 11/28/14 2120    Lab Results: No results found for this or any previous visit (from the past 48 hour(s)).  Physical Findings: AIMS: Facial and Oral Movements Muscles of Facial Expression: None, normal Lips and Perioral Area: None, normal Jaw: None, normal Tongue: None, normal,Extremity Movements Upper (arms, wrists, hands, fingers): None, normal Lower (legs, knees, ankles, toes): None, normal, Trunk Movements Neck, shoulders, hips: None, normal, Overall Severity Severity of abnormal movements (highest score from questions above): None, normal Incapacitation due to abnormal movements: None, normal Patient's awareness of abnormal movements (rate only patient's report): No Awareness, Dental Status Current problems with teeth and/or dentures?: No Does patient usually wear dentures?: Yes (upper dentures)  CIWA:  CIWA-Ar Total: 3 COWS:  COWS Total Score: 3  Treatment Plan Summary: Daily contact with  patient to assess and evaluate symptoms and progress in treatment and Medication management Supportive approach/coping skills Cocaine dependence; continue to work a relapse prevention plan Depression; work with CBT/mindfulness, consider increasing the Cymbalta Pain; continue the Tramadol with the Soma ( states that both combined offer some relief) Placement; explore residential treatment program as well as more permanent placements like assisted living facilities  Medical Decision Making:  Review of Psycho-Social Stressors (1), Review of Medication Regimen & Side Effects (2) and Review of New Medication or Change in Dosage (2)     Carine Nordgren A 11/29/2014, 4:37 PM

## 2014-11-29 NOTE — Clinical Social Work Note (Signed)
FL2 and PASSR request made per pt request. Pt signed consent. [facility] referral also made on pt behalf. [facility] referral pending.   Trula Slade, LCSWA Clinical Social Worker 11/29/2014 4:20 PM

## 2014-11-29 NOTE — Plan of Care (Signed)
Problem: Diagnosis: Increased Risk For Suicide Attempt Goal: STG-Patient Will Report Suicidal Feelings to Staff Outcome: Progressing Summer Estrada is able to report suicidal ideation and is contracting for safety while at [facility]

## 2014-11-29 NOTE — [facility] Group Notes (Signed)
[facility] LCSW Group Therapy  11/29/2014 12:50 PM  Type of Therapy:  Group Therapy  Participation Level:  Minimal  Participation Quality:  Resistant  Affect:  Depressed and Irritable  Cognitive:  Oriented  Insight:  Lacking  Engagement in Therapy:  Limited  Modes of Intervention:  Confrontation, Discussion, Education, Exploration, Problem-solving, Rapport Building, Socialization and Support  Summary of Progress/Problems: Today's Topic: Overcoming Obstacles. Patients identified one short term goal and potential obstacles in reaching this goal. Patients processed barriers involved in overcoming these obstacles. Patients identified steps necessary for overcoming these obstacles and explored motivation (internal and external) for facing these difficulties head on. Halena was attentive but resistant to active participation in the group setting. Pt stated her biggest obstacle is her lack of family support. "I refuse to be homeless and I won't do it. I'll die first." Towanda reports that she burned bridges with her family but reports being angry because they refuse to help her at this point. She demonstrates limited insight and continues to report passive SI and depression/irritability.   Smart, Tywanda Rice LCSWA  11/29/2014, 12:50 PM

## 2014-11-29 NOTE — [facility] Group Notes (Signed)
[facility] LCSW Aftercare Discharge Planning Group Note   11/29/2014 10:57 AM  Participation Quality: Minimal  Mood/Affect:  Irritable  Depression Rating:  "high"  Anxiety Rating:  6-7  Thoughts of Suicide:  No Will you contract for safety?   NA  Current AVH:  No  Plan for Discharge/Comments:  Pt reports that "I shouldn't have gotten my hopes up about my brother helping me. He didn't even come to see me last night." Pt irritable and depressed this morning. Requesting [facility] referral. CSW assessing.   Transportation Means: unknown at this time.   Supports: none identified by pt.   Smart, [employer]

## 2014-11-30 LAB — CBC WITH DIFFERENTIAL/PLATELET
BASOS ABS: 0 10*3/uL (ref 0.0–0.1)
BASOS PCT: 0 %
EOS PCT: 0 %
Eosinophils Absolute: 0.1 10*3/uL (ref 0.0–0.7)
HCT: 35.2 % — ABNORMAL LOW (ref 36.0–46.0)
Hemoglobin: 11.3 g/dL — ABNORMAL LOW (ref 12.0–15.0)
LYMPHS PCT: 12 %
Lymphs Abs: 2.4 10*3/uL (ref 0.7–4.0)
MCH: 26.5 pg (ref 26.0–34.0)
MCHC: 32.1 g/dL (ref 30.0–36.0)
MCV: 82.6 fL (ref 78.0–100.0)
Monocytes Absolute: 1.6 10*3/uL — ABNORMAL HIGH (ref 0.1–1.0)
Monocytes Relative: 8 %
NEUTROS ABS: 15.9 10*3/uL — AB (ref 1.7–7.7)
Neutrophils Relative %: 80 %
PLATELETS: 342 10*3/uL (ref 150–400)
RBC: 4.26 MIL/uL (ref 3.87–5.11)
RDW: 14.6 % (ref 11.5–15.5)
WBC: 20 10*3/uL — AB (ref 4.0–10.5)

## 2014-11-30 LAB — URINALYSIS, ROUTINE W REFLEX MICROSCOPIC
Bilirubin Urine: NEGATIVE
Glucose, UA: NEGATIVE mg/dL
Ketones, ur: 40 mg/dL — AB
Nitrite: POSITIVE — AB
Protein, ur: 30 mg/dL — AB
Specific Gravity, Urine: 1.014 (ref 1.005–1.030)
Urobilinogen, UA: 4 mg/dL — ABNORMAL HIGH (ref 0.0–1.0)
pH: 6 (ref 5.0–8.0)

## 2014-11-30 LAB — COMPREHENSIVE METABOLIC PANEL
ALBUMIN: 3 g/dL — AB (ref 3.5–5.0)
ALT: 28 U/L (ref 14–54)
AST: 29 U/L (ref 15–41)
Alkaline Phosphatase: 85 U/L (ref 38–126)
Anion gap: 11 (ref 5–15)
BUN: 10 mg/dL (ref 6–20)
CHLORIDE: 94 mmol/L — AB (ref 101–111)
CO2: 29 mmol/L (ref 22–32)
CREATININE: 0.76 mg/dL (ref 0.44–1.00)
Calcium: 9 mg/dL (ref 8.9–10.3)
GFR calc Af Amer: >60 mL/min (ref >60)
GLUCOSE: 148 mg/dL — AB (ref 65–99)
Potassium: 4 mmol/L (ref 3.5–5.1)
Sodium: 134 mmol/L — ABNORMAL LOW (ref 135–145)
Total Bilirubin: 0.5 mg/dL (ref 0.3–1.2)
Total Protein: 7.2 g/dL (ref 6.5–8.1)

## 2014-11-30 LAB — URINE MICROSCOPIC-ADD ON

## 2014-11-30 MED ORDER — DULOXETINE HCL 60 MG PO CPEP
90.0000 mg | ORAL_CAPSULE | Freq: Every day | ORAL | Status: DC
  Administered 2014-12-01: 90 mg via ORAL
  Filled 2014-11-30 (×5): qty 1

## 2014-11-30 MED ORDER — ENSURE ENLIVE PO LIQD
237.0000 mL | Freq: Two times a day (BID) | ORAL | Status: DC
  Administered 2014-11-30 – 2014-12-01 (×2): 237 mL via ORAL
  Filled 2014-11-30: qty 237

## 2014-11-30 MED ORDER — OXYCODONE HCL 5 MG PO TABS
5.0000 mg | ORAL_TABLET | ORAL | Status: DC | PRN
  Administered 2014-11-30 – 2014-12-01 (×3): 5 mg via ORAL
  Filled 2014-11-30 (×3): qty 1

## 2014-11-30 NOTE — Progress Notes (Signed)
[facility] MD Progress Note  11/30/2014 5:00 PM Summer Estrada  MRN:  [id number] Subjective:  Summer Estrada continues to have a hard time she feels hopeless helpless dealing with her pain. States she is trying hard to have some hope but she cant. Continues to state that her lack of empathy from her brother was devastating. She would like to get settle and have some stability in her life. Still endorses thoughts of suicide Principal Problem: Cocaine dependence with cocaine-induced mood disorder Diagnosis:   Patient Active Problem List   Diagnosis Date Noted  . Cocaine dependence with cocaine-induced mood disorder [F14.24] 11/27/2014  . GAD (generalized anxiety disorder) [F41.1] 11/27/2014  . Depression, major, recurrent, moderate [F33.1] 11/27/2014  . Cocaine abuse, continuous use [F14.10] 11/24/2014  . Low back pain [M54.5] 04/21/2013  . Hyperlipidemia [E78.5]   . GERD (gastroesophageal reflux disease) [K21.9]   . Arthritis [M19.90]   . Knee pain [M25.569]   . Wears dentures [Z98.811]   . Chronic pain [G89.29]   . Wears glasses [Z97.3]   . Trochanteric bursitis of right hip [M70.61] 06/22/2012  . Multinodular goiter [E04.2] 05/19/2012  . Medial meniscus tear [S83.249A] 05/07/2012  . Smoker [Z72.0] 04/14/2012  . Eczema [L30.9] 04/14/2012  . Hyperthyroidism [E05.90] 04/14/2012  . Hypotension [I95.9] 04/14/2012  . Cervical spinal stenosis [M48.02] 03/18/2012  . Insomnia [G47.00] 02/20/2012  . Osteoarthritis of both knees [M17.0] 08/08/2011  . Anxiety and depression [F41.8] 08/08/2011  . Cervical spondylosis [M47.812] 06/23/2011  . Patellar tendonitis [M76.50] 06/23/2011  . Myalgia and myositis, unspecified [M79.1, M60.9] 06/23/2011  . Calcifying tendinitis of shoulder [M75.30] 06/23/2011   Total Time spent with patient: 30 minutes   Past Medical History:  Past Medical History  Diagnosis Date  . Knee pain   . Chronic pain   . Wears glasses   . Wears dentures     upper  . GERD  (gastroesophageal reflux disease)     occ  . Depression   . Arthritis   . Hyperlipidemia   . Anxiety     Past Surgical History  Procedure Laterality Date  . Total hip arthroplasty  2010    lt hip  . Cesarean section      x2  . Tubal ligation     Family History:  Family History  Problem Relation Age of Onset  . Kidney disease Mother   . Heart disease Father    Social History:  History  Alcohol Use No     History  Drug Use  . Yes  . Special: Cocaine    Social History   Social History  . Marital Status: Widowed    Spouse Name: N/A  . Number of Children: N/A  . Years of Education: N/A   Social History Main Topics  . Smoking status: Current Every Day Smoker -- 1.50 packs/day  . Smokeless tobacco: Never Used  . Alcohol Use: No  . Drug Use: Yes    Special: Cocaine  . Sexual Activity: Yes    Birth Control/ Protection: Post-menopausal     Comment: none   Other Topics Concern  . None   Social History Narrative   WIDOW   CHILDREN; ADULT GIRLS; 3   DISABLED   Additional History:    Sleep: Fair  Appetite:  Poor   Assessment:   Musculoskeletal: Strength & Muscle Tone: within normal limits Gait & Station: affected by pain in her knee hip Patient leans: normal   Psychiatric Specialty Exam: Physical Exam  ROS  Blood  pressure 103/56, pulse 111, temperature 100.5 F (38.1 C), temperature source Oral, resp. rate 20, height 5\' 3"  (1.6 m), weight 75.297 kg (166 lb), SpO2 90 %.Body mass index is 29.41 kg/(m^2).  General Appearance: Fairly Groomed  Patent attorney::  Minimal  Speech:  Clear and Coherent  Volume:  Decreased  Mood:  Anxious, Depressed and Dysphoric  Affect:  sad, anxious in pain  Thought Process:  Coherent and Goal Directed  Orientation:  Full (Time, Place, and Person)  Thought Content:  symptoms events worries concerns  Suicidal Thoughts:  Yes.  without intent/plan  Homicidal Thoughts:  No  Memory:  Immediate;   Fair Recent;   Fair Remote;    Fair  Judgement:  Fair  Insight:  Present  Psychomotor Activity:  Restlessness  Concentration:  Fair  Recall:  [employer] of Knowledge:Fair  Language: Fair  Akathisia:  No  Handed:  Right  AIMS (if indicated):     Assets:  Desire for Improvement  ADL's:  Intact  Cognition: WNL  Sleep:  Number of Hours: 6.75     Current Medications: Current Facility-Administered Medications  Medication Dose Route Frequency Provider Last Rate Last Dose  . acetaminophen (TYLENOL) tablet 650 mg  650 mg Oral Q6H PRN Sanjuana Kava, NP   650 mg at 11/28/14 0258  . albuterol (PROVENTIL HFA;VENTOLIN HFA) 108 (90 BASE) MCG/ACT inhaler 2 puff  2 puff Inhalation Q6H PRN Rachael Fee, MD   2 puff at 11/29/14 [id number]  . alum & mag hydroxide-simeth (MAALOX/MYLANTA) 200-200-20 MG/5ML suspension 30 mL  30 mL Oral Q4H PRN Sanjuana Kava, NP      . atorvastatin (LIPITOR) tablet 20 mg  20 mg Oral Daily Sanjuana Kava, NP   20 mg at 11/30/14 0827  . carisoprodol (SOMA) tablet 350 mg  350 mg Oral Q6H PRN Rachael Fee, MD   350 mg at 11/30/14 1512  . DULoxetine (CYMBALTA) DR capsule 60 mg  60 mg Oral Daily Rachael Fee, MD   60 mg at 11/30/14 0827  . famotidine (PEPCID) tablet 10 mg  10 mg Oral Daily Sanjuana Kava, NP   10 mg at 11/30/14 0826  . feeding supplement (ENSURE ENLIVE) (ENSURE ENLIVE) liquid 237 mL  237 mL Oral BID BM Rachael Fee, MD      . gabapentin (NEURONTIN) capsule 600 mg  600 mg Oral TID WC & HS Rachael Fee, MD   600 mg at 11/30/14 1646  . guaifenesin (ROBITUSSIN) 100 MG/5ML syrup 200 mg  200 mg Oral Q6H PRN Rachael Fee, MD   200 mg at 11/29/14 1731  . hydrOXYzine (ATARAX/VISTARIL) tablet 50 mg  50 mg Oral Q4H PRN Sanjuana Kava, NP   50 mg at 11/30/14 0043  . magnesium hydroxide (MILK OF MAGNESIA) suspension 30 mL  30 mL Oral Daily PRN Sanjuana Kava, NP      . meloxicam (MOBIC) tablet 15 mg  15 mg Oral Daily Sanjuana Kava, NP   15 mg at 11/30/14 0826  . nicotine (NICODERM CQ - dosed in mg/24 hours)  patch 21 mg  21 mg Transdermal Daily Rachael Fee, MD   21 mg at 11/30/14 0825  . nitrofurantoin (macrocrystal-monohydrate) (MACROBID) capsule 100 mg  100 mg Oral Q12H Craige Cotta, MD   100 mg at 11/30/14 0827  . ondansetron (ZOFRAN-ODT) disintegrating tablet 4 mg  4 mg Oral Q8H PRN Rachael Fee, MD   4 mg  at 11/28/14 1322  . oxyCODONE (Oxy IR/ROXICODONE) immediate release tablet 5 mg  5 mg Oral Q4H PRN Rachael Fee, MD      . traZODone (DESYREL) tablet 100 mg  100 mg Oral QHS Sanjuana Kava, NP   100 mg at 11/29/14 2117    Lab Results: No results found for this or any previous visit (from the past 48 hour(s)).  Physical Findings: AIMS: Facial and Oral Movements Muscles of Facial Expression: None, normal Lips and Perioral Area: None, normal Jaw: None, normal Tongue: None, normal,Extremity Movements Upper (arms, wrists, hands, fingers): None, normal Lower (legs, knees, ankles, toes): None, normal, Trunk Movements Neck, shoulders, hips: None, normal, Overall Severity Severity of abnormal movements (highest score from questions above): None, normal Incapacitation due to abnormal movements: None, normal Patient's awareness of abnormal movements (rate only patient's report): No Awareness, Dental Status Current problems with teeth and/or dentures?: No Does patient usually wear dentures?: Yes (upper dentures)  CIWA:  CIWA-Ar Total: 3 COWS:  COWS Total Score: 3  Treatment Plan Summary: Daily contact with patient to assess and evaluate symptoms and progress in treatment and Medication management Supportive approach/coping skills Cocaine dependence; continue to work a relapse prevention plan Depression; continue to work with the Cymbalta, will increase to 90 mg Pain; as above but also will D/C the Tramadol and have a trial with Oxycodone 5 mg Q 4 H. States this medication was effective in controlling her pain in the past. She was taken off when her UDS was positive for cocaine. The pain  is affecting her ability to participate and benefit from the program Placement; will continue to explore placement options Medical Decision Making:  Review of Psycho-Social Stressors (1), Review or order clinical lab tests (1), Review of Medication Regimen & Side Effects (2) and Review of New Medication or Change in Dosage (2)     Samnang Shugars A 11/30/2014, 5:00 PM

## 2014-11-30 NOTE — Plan of Care (Signed)
Problem: Diagnosis: Increased Risk For Suicide Attempt Goal: LTG-Patient Will Report Improved Mood and Deny Suicidal LTG (by discharge) Patient will report improved mood and deny suicidal ideation.  Outcome: Progressing Pt continues to have irritability and denies si. Goal: STG-Patient Will Report Suicidal Feelings to Staff Outcome: Progressing Pt denies si today.

## 2014-11-30 NOTE — [facility] Group Notes (Signed)
[facility] LCSW Group Therapy  11/30/2014 1:06 PM  Type of Therapy:  Group Therapy  Participation Level:  Did Not Attend-pt chose to remain in bed.   Modes of Intervention:  Confrontation, Discussion, Education, Exploration, Problem-solving, Rapport Building, Socialization and Support  Summary of Progress/Problems: Emotion Regulation: This group focused on both positive and negative emotion identification and allowed group members to process ways to identify feelings, regulate negative emotions, and find healthy ways to manage internal/external emotions. Group members were asked to reflect on a time when their reaction to an emotion led to a negative outcome and explored how alternative responses using emotion regulation would have benefited them. Group members were also asked to discuss a time when emotion regulation was utilized when a negative emotion was experienced.   Smart, Tyshaun Vinzant LCSWA  11/30/2014, 1:06 PM

## 2014-11-30 NOTE — Progress Notes (Signed)
Patient did not attend the evening karaoke group. Pt was notified that group was beginning but remained in bed.    

## 2014-11-30 NOTE — Progress Notes (Signed)
Pt reports that her pain level has been up today.  She says that the tramadol has not seemed to help much today like it did yesterday.  She is having passive suicidal thoughts, but contracts for safety.  She has been in and out of the dayroom this evening, but did chose to go to bed early tonight.  Pt was medicated per orders.  Discharge plans are still in process.  Pt is still unsure where she will be going.  Support and encouragement offered.  Safety maintained with q15 minute checks.

## 2014-11-30 NOTE — [facility] Group Notes (Signed)
[facility] Group Notes:  (Nursing/MHT/Case Management/Adjunct)  Date:  11/30/2014  Time:  11:08 AM  Type of Therapy:  Nurse Education  /  Wellness Group :  The group is focused on helping patients identify why they were hospitalized, what they are doing that is working and what they currently need..influenza order to maintain their wellness.  Participation Level:  Did Not Attend  Participation Quality:    Affect:    Cognitive:    Insight:    Engagement in Group:    Modes of Intervention:    Summary of Progress/Problems:  Summer Estrada 11/30/2014, 11:08 AM

## 2014-11-30 NOTE — Plan of Care (Signed)
Problem: Alteration in mood Goal: LTG-Patient reports reduction in suicidal thoughts (Patient reports reduction in suicidal thoughts and is able to verbalize a safety plan for whenever patient is feeling suicidal)  Outcome: Not Progressing Pt SI-contracts for safety  Problem: Alteration in mood; excessive anxiety as evidenced by: Goal: LTG-Patient's behavior demonstrates decreased anxiety (Patient's behavior demonstrates anxiety and he/she is utilizing learned coping skills to deal with anxiety-producing situations)  Outcome: Not Progressing Pt anxious due to pain and pt feels hopeless  Problem: Ineffective individual coping Goal: LTG: Patient will report a decrease in negative feelings Outcome: Not Progressing Pt continues to feel hopeless due to pain

## 2014-11-30 NOTE — Clinical Social Work Note (Signed)
CSW spoke with [facility] admissions and verified that pt is being reviewed and will be called in the next day or two with update.  Trula Slade, LCSWA Clinical Social Worker 11/30/2014 3:29 PM '

## 2014-11-30 NOTE — Progress Notes (Signed)
D: SI-contracts for safety.  Pt denies HI/AVH. Pt is pleasant and cooperative. Pt very depressed and feels hopeless. Pt continues to focus on pain . Pt believes there is no hope for her situation. Pt informed to explore non-pharmacal solutions to pain relief and discuss things with her doctor. Pt wants to go to an assisted living when she leaves.   A: Pt was offered support and encouragement. Pt was given scheduled medications. Pt was encourage to attend groups. Q 15 minute checks were done for safety.   R: Pt is taking medication. Pt has no complaints.Pt receptive to treatment and safety maintained on unit.

## 2014-11-30 NOTE — Progress Notes (Signed)
D:Pt is irritable and sweaty this morning with generalized pain score of an 8 on 0-10 scale with 10 being the most. Pt reports that her pain only decreases when she is in bed. Pt rates depression as a 10 on 0-10 scale with 10 being the most. Pt's temp was elevated at 100.5. Reported to MD and received orders for labs.    A:Offered support, encouragement and 15 minute checks.  R:Pt denies si and hi. Safety maintained on the unit.

## 2014-12-01 ENCOUNTER — Encounter (HOSPITAL_COMMUNITY): Payer: Self-pay | Admitting: *Deleted

## 2014-12-01 ENCOUNTER — Inpatient Hospital Stay (HOSPITAL_COMMUNITY): Payer: Medicaid Other

## 2014-12-01 ENCOUNTER — Inpatient Hospital Stay (HOSPITAL_COMMUNITY)
Admission: AD | Admit: 2014-12-01 | Discharge: 2014-12-07 | DRG: 871 | Disposition: A | Discharge status: Home / Self Care | Payer: Commercial Managed Care - HMO | Source: Other Acute Inpatient Hospital | Attending: Family Medicine | Admitting: Family Medicine

## 2014-12-01 DIAGNOSIS — Z79899 Other long term (current) drug therapy: Secondary | ICD-10-CM | POA: Diagnosis not present

## 2014-12-01 DIAGNOSIS — R079 Chest pain, unspecified: Secondary | ICD-10-CM | POA: Diagnosis present

## 2014-12-01 DIAGNOSIS — M1990 Unspecified osteoarthritis, unspecified site: Secondary | ICD-10-CM | POA: Diagnosis present

## 2014-12-01 DIAGNOSIS — F411 Generalized anxiety disorder: Secondary | ICD-10-CM

## 2014-12-01 DIAGNOSIS — F329 Major depressive disorder, single episode, unspecified: Secondary | ICD-10-CM | POA: Diagnosis present

## 2014-12-01 DIAGNOSIS — A419 Sepsis, unspecified organism: Secondary | ICD-10-CM | POA: Diagnosis not present

## 2014-12-01 DIAGNOSIS — K219 Gastro-esophageal reflux disease without esophagitis: Secondary | ICD-10-CM

## 2014-12-01 DIAGNOSIS — D638 Anemia in other chronic diseases classified elsewhere: Secondary | ICD-10-CM | POA: Diagnosis present

## 2014-12-01 DIAGNOSIS — Z789 Other specified health status: Secondary | ICD-10-CM

## 2014-12-01 DIAGNOSIS — F1994 Other psychoactive substance use, unspecified with psychoactive substance-induced mood disorder: Secondary | ICD-10-CM | POA: Diagnosis not present

## 2014-12-01 DIAGNOSIS — F419 Anxiety disorder, unspecified: Secondary | ICD-10-CM

## 2014-12-01 DIAGNOSIS — E785 Hyperlipidemia, unspecified: Secondary | ICD-10-CM | POA: Diagnosis present

## 2014-12-01 DIAGNOSIS — I959 Hypotension, unspecified: Secondary | ICD-10-CM | POA: Diagnosis present

## 2014-12-01 DIAGNOSIS — F17210 Nicotine dependence, cigarettes, uncomplicated: Secondary | ICD-10-CM | POA: Diagnosis present

## 2014-12-01 DIAGNOSIS — Z885 Allergy status to narcotic agent status: Secondary | ICD-10-CM | POA: Diagnosis not present

## 2014-12-01 DIAGNOSIS — T450X2A Poisoning by antiallergic and antiemetic drugs, intentional self-harm, initial encounter: Secondary | ICD-10-CM | POA: Diagnosis not present

## 2014-12-01 DIAGNOSIS — F418 Other specified anxiety disorders: Secondary | ICD-10-CM | POA: Diagnosis not present

## 2014-12-01 DIAGNOSIS — Z8249 Family history of ischemic heart disease and other diseases of the circulatory system: Secondary | ICD-10-CM

## 2014-12-01 DIAGNOSIS — J9600 Acute respiratory failure, unspecified whether with hypoxia or hypercapnia: Secondary | ICD-10-CM | POA: Diagnosis not present

## 2014-12-01 DIAGNOSIS — N390 Urinary tract infection, site not specified: Secondary | ICD-10-CM | POA: Diagnosis present

## 2014-12-01 DIAGNOSIS — R45851 Suicidal ideations: Secondary | ICD-10-CM

## 2014-12-01 DIAGNOSIS — J852 Abscess of lung without pneumonia: Secondary | ICD-10-CM | POA: Diagnosis present

## 2014-12-01 DIAGNOSIS — Z7982 Long term (current) use of aspirin: Secondary | ICD-10-CM

## 2014-12-01 DIAGNOSIS — E871 Hypo-osmolality and hyponatremia: Secondary | ICD-10-CM | POA: Diagnosis present

## 2014-12-01 DIAGNOSIS — R6520 Severe sepsis without septic shock: Secondary | ICD-10-CM

## 2014-12-01 DIAGNOSIS — J850 Gangrene and necrosis of lung: Secondary | ICD-10-CM | POA: Diagnosis present

## 2014-12-01 DIAGNOSIS — G8929 Other chronic pain: Secondary | ICD-10-CM | POA: Diagnosis present

## 2014-12-01 DIAGNOSIS — J984 Other disorders of lung: Secondary | ICD-10-CM

## 2014-12-01 DIAGNOSIS — F1410 Cocaine abuse, uncomplicated: Secondary | ICD-10-CM | POA: Diagnosis not present

## 2014-12-01 DIAGNOSIS — F1424 Cocaine dependence with cocaine-induced mood disorder: Secondary | ICD-10-CM | POA: Diagnosis present

## 2014-12-01 DIAGNOSIS — J851 Abscess of lung with pneumonia: Secondary | ICD-10-CM

## 2014-12-01 DIAGNOSIS — G4700 Insomnia, unspecified: Secondary | ICD-10-CM | POA: Diagnosis present

## 2014-12-01 DIAGNOSIS — E876 Hypokalemia: Secondary | ICD-10-CM | POA: Diagnosis present

## 2014-12-01 DIAGNOSIS — T1491 Suicide attempt: Secondary | ICD-10-CM | POA: Diagnosis not present

## 2014-12-01 DIAGNOSIS — F331 Major depressive disorder, recurrent, moderate: Secondary | ICD-10-CM | POA: Diagnosis present

## 2014-12-01 DIAGNOSIS — Z96649 Presence of unspecified artificial hip joint: Secondary | ICD-10-CM | POA: Diagnosis present

## 2014-12-01 DIAGNOSIS — J189 Pneumonia, unspecified organism: Secondary | ICD-10-CM | POA: Diagnosis present

## 2014-12-01 DIAGNOSIS — B961 Klebsiella pneumoniae [K. pneumoniae] as the cause of diseases classified elsewhere: Secondary | ICD-10-CM | POA: Diagnosis present

## 2014-12-01 LAB — CBC WITH DIFFERENTIAL/PLATELET
Basophils Absolute: 0 K/uL (ref 0.0–0.1)
Basophils Relative: 0 %
Eosinophils Absolute: 0.1 K/uL (ref 0.0–0.7)
Eosinophils Relative: 0 %
HCT: 29.8 % — ABNORMAL LOW (ref 36.0–46.0)
Hemoglobin: 9.5 g/dL — ABNORMAL LOW (ref 12.0–15.0)
Lymphocytes Relative: 11 %
Lymphs Abs: 2.1 K/uL (ref 0.7–4.0)
MCH: 26.2 pg (ref 26.0–34.0)
MCHC: 31.9 g/dL (ref 30.0–36.0)
MCV: 82.3 fL (ref 78.0–100.0)
Monocytes Absolute: 1.7 K/uL — ABNORMAL HIGH (ref 0.1–1.0)
Monocytes Relative: 8 %
Neutro Abs: 16.2 K/uL — ABNORMAL HIGH (ref 1.7–7.7)
Neutrophils Relative %: 81 %
Platelets: 270 K/uL (ref 150–400)
RBC: 3.62 MIL/uL — ABNORMAL LOW (ref 3.87–5.11)
RDW: 14.4 % (ref 11.5–15.5)
WBC: 20.1 K/uL — ABNORMAL HIGH (ref 4.0–10.5)

## 2014-12-01 LAB — COMPREHENSIVE METABOLIC PANEL WITH GFR
ALT: 30 U/L (ref 14–54)
AST: 33 U/L (ref 15–41)
Albumin: 2.5 g/dL — ABNORMAL LOW (ref 3.5–5.0)
Alkaline Phosphatase: 76 U/L (ref 38–126)
Anion gap: 8 (ref 5–15)
BUN: 11 mg/dL (ref 6–20)
CO2: 29 mmol/L (ref 22–32)
Calcium: 8.2 mg/dL — ABNORMAL LOW (ref 8.9–10.3)
Chloride: 95 mmol/L — ABNORMAL LOW (ref 101–111)
Creatinine, Ser: 0.57 mg/dL (ref 0.44–1.00)
GFR calc Af Amer: >60 mL/min
GFR calc non Af Amer: >60 mL/min
Glucose, Bld: 110 mg/dL — ABNORMAL HIGH (ref 65–99)
Potassium: 3.9 mmol/L (ref 3.5–5.1)
Sodium: 132 mmol/L — ABNORMAL LOW (ref 135–145)
Total Bilirubin: 0.5 mg/dL (ref 0.3–1.2)
Total Protein: 6.3 g/dL — ABNORMAL LOW (ref 6.5–8.1)

## 2014-12-01 LAB — URINE MICROSCOPIC-ADD ON

## 2014-12-01 LAB — URINALYSIS, ROUTINE W REFLEX MICROSCOPIC
Glucose, UA: NEGATIVE mg/dL
Ketones, ur: 15 mg/dL — AB
Nitrite: POSITIVE — AB
Protein, ur: 100 mg/dL — AB
Specific Gravity, Urine: 1.021 (ref 1.005–1.030)
Urobilinogen, UA: 4 mg/dL — ABNORMAL HIGH (ref 0.0–1.0)
pH: 6 (ref 5.0–8.0)

## 2014-12-01 LAB — LIPASE, BLOOD: LIPASE: 12 U/L — AB (ref 22–51)

## 2014-12-01 LAB — TROPONIN I: Troponin I: <0.03 ng/mL (ref <0.031)

## 2014-12-01 LAB — I-STAT CG4 LACTIC ACID, ED: Lactic Acid, Venous: 1.53 mmol/L (ref 0.5–2.0)

## 2014-12-01 MED ORDER — IOHEXOL 300 MG/ML  SOLN
100.0000 mL | Freq: Once | INTRAMUSCULAR | Status: AC | PRN
  Administered 2014-12-01: 100 mL via INTRAVENOUS

## 2014-12-01 MED ORDER — DEXTROSE 5 % IV SOLN: 1.0000 g | INTRAVENOUS | Status: DC

## 2014-12-01 MED ORDER — ENOXAPARIN SODIUM 40 MG/0.4ML ~~LOC~~ SOLN
40.0000 mg | Freq: Every day | SUBCUTANEOUS | Status: DC
  Administered 2014-12-01: 40 mg via SUBCUTANEOUS
  Filled 2014-12-01 (×3): qty 0.4

## 2014-12-01 MED ORDER — SODIUM CHLORIDE 0.9 % IV BOLUS (SEPSIS)
500.0000 mL | INTRAVENOUS | Status: AC
  Administered 2014-12-01: 500 mL via INTRAVENOUS

## 2014-12-01 MED ORDER — DEXTROSE 5 % IV SOLN
2.0000 g | INTRAVENOUS | Status: DC
  Administered 2014-12-02 – 2014-12-04 (×3): 2 g via INTRAVENOUS
  Filled 2014-12-01 (×4): qty 2

## 2014-12-01 MED ORDER — FAMOTIDINE 10 MG PO TABS
10.0000 mg | ORAL_TABLET | Freq: Every day | ORAL | Status: DC
  Administered 2014-12-01 – 2014-12-02 (×2): 10 mg via ORAL
  Filled 2014-12-01 (×3): qty 1

## 2014-12-01 MED ORDER — CARISOPRODOL 350 MG PO TABS
350.0000 mg | ORAL_TABLET | Freq: Three times a day (TID) | ORAL | Status: DC | PRN
  Administered 2014-12-02 – 2014-12-04 (×7): 350 mg via ORAL
  Filled 2014-12-01 (×7): qty 1

## 2014-12-01 MED ORDER — KETOROLAC TROMETHAMINE 30 MG/ML IJ SOLN
30.0000 mg | Freq: Once | INTRAMUSCULAR | Status: AC
  Administered 2014-12-01: 30 mg via INTRAVENOUS
  Filled 2014-12-01: qty 1

## 2014-12-01 MED ORDER — CLINDAMYCIN PHOSPHATE 600 MG/50ML IV SOLN: 600.0000 mg | Freq: Three times a day (TID) | INTRAVENOUS | Status: DC

## 2014-12-01 MED ORDER — GABAPENTIN 300 MG PO CAPS
600.0000 mg | ORAL_CAPSULE | Freq: Three times a day (TID) | ORAL | Status: DC
  Administered 2014-12-01 – 2014-12-07 (×17): 600 mg via ORAL
  Filled 2014-12-01 (×20): qty 2

## 2014-12-01 MED ORDER — ENOXAPARIN SODIUM 40 MG/0.4ML ~~LOC~~ SOLN
40.0000 mg | SUBCUTANEOUS | Status: DC
  Filled 2014-12-01: qty 0.4

## 2014-12-01 MED ORDER — OXYCODONE HCL 5 MG PO TABS
5.0000 mg | ORAL_TABLET | ORAL | Status: DC | PRN
  Administered 2014-12-01 – 2014-12-04 (×12): 5 mg via ORAL
  Filled 2014-12-01 (×12): qty 1

## 2014-12-01 MED ORDER — POTASSIUM CHLORIDE IN NACL 20-0.9 MEQ/L-% IV SOLN
INTRAVENOUS | Status: DC
  Filled 2014-12-01 (×2): qty 1000

## 2014-12-01 MED ORDER — NICOTINE 21 MG/24HR TD PT24
21.0000 mg | MEDICATED_PATCH | Freq: Every day | TRANSDERMAL | Status: DC
  Administered 2014-12-02 – 2014-12-07 (×6): 21 mg via TRANSDERMAL
  Filled 2014-12-01 (×6): qty 1

## 2014-12-01 MED ORDER — VANCOMYCIN HCL IN DEXTROSE 1-5 GM/200ML-% IV SOLN
1000.0000 mg | Freq: Once | INTRAVENOUS | Status: AC
  Administered 2014-12-01: 1000 mg via INTRAVENOUS
  Filled 2014-12-01: qty 200

## 2014-12-01 MED ORDER — ATORVASTATIN CALCIUM 10 MG PO TABS
20.0000 mg | ORAL_TABLET | Freq: Every day | ORAL | Status: DC
  Administered 2014-12-02 – 2014-12-06 (×4): 20 mg via ORAL
  Filled 2014-12-01: qty 2
  Filled 2014-12-01: qty 1
  Filled 2014-12-01 (×2): qty 2
  Filled 2014-12-01 (×2): qty 1

## 2014-12-01 MED ORDER — SODIUM CHLORIDE 0.9 % IV BOLUS (SEPSIS)
1000.0000 mL | INTRAVENOUS | Status: AC
  Administered 2014-12-01 (×2): 1000 mL via INTRAVENOUS

## 2014-12-01 MED ORDER — CLINDAMYCIN PHOSPHATE 600 MG/50ML IV SOLN
600.0000 mg | Freq: Three times a day (TID) | INTRAVENOUS | Status: DC
  Administered 2014-12-01 – 2014-12-02 (×2): 600 mg via INTRAVENOUS
  Filled 2014-12-01 (×3): qty 50

## 2014-12-01 MED ORDER — SODIUM CHLORIDE 0.9 % IV BOLUS (SEPSIS)
1000.0000 mL | Freq: Once | INTRAVENOUS | Status: AC
  Administered 2014-12-01: 1000 mL via INTRAVENOUS

## 2014-12-01 MED ORDER — IOHEXOL 300 MG/ML  SOLN
25.0000 mL | Freq: Once | INTRAMUSCULAR | Status: AC | PRN
  Administered 2014-12-01: 25 mL via ORAL

## 2014-12-01 MED ORDER — DEXTROSE 5 % IV SOLN: 2.0000 g | INTRAVENOUS | Status: DC

## 2014-12-01 MED ORDER — TRAZODONE HCL 50 MG PO TABS
50.0000 mg | ORAL_TABLET | Freq: Every day | ORAL | Status: DC
  Administered 2014-12-01 – 2014-12-06 (×5): 50 mg via ORAL
  Filled 2014-12-01 (×7): qty 1

## 2014-12-01 MED ORDER — SODIUM CHLORIDE 0.9 % IV SOLN
1000.0000 mL | INTRAVENOUS | Status: DC
  Administered 2014-12-01: 1000 mL via INTRAVENOUS

## 2014-12-01 MED ORDER — DEXTROSE 5 % IV SOLN
2.0000 g | Freq: Once | INTRAVENOUS | Status: AC
  Administered 2014-12-01: 2 g via INTRAVENOUS
  Filled 2014-12-01: qty 2

## 2014-12-01 MED ORDER — LEVOFLOXACIN 750 MG PO TABS
750.0000 mg | ORAL_TABLET | Freq: Every day | ORAL | Status: DC
  Filled 2014-12-01 (×2): qty 1

## 2014-12-01 MED ORDER — ASPIRIN EC 81 MG PO TBEC
81.0000 mg | DELAYED_RELEASE_TABLET | Freq: Every day | ORAL | Status: DC
  Administered 2014-12-02 – 2014-12-07 (×6): 81 mg via ORAL
  Filled 2014-12-01 (×6): qty 1

## 2014-12-01 MED ORDER — SODIUM CHLORIDE 0.9 % IV SOLN
INTRAVENOUS | Status: DC
  Administered 2014-12-02: 16:00:00 via INTRAVENOUS
  Administered 2014-12-02 – 2014-12-03 (×2): 125 mL/h via INTRAVENOUS
  Administered 2014-12-04 – 2014-12-05 (×3): via INTRAVENOUS
  Administered 2014-12-05: 1000 mL via INTRAVENOUS

## 2014-12-01 NOTE — Progress Notes (Signed)
Patient ID: Summer Estrada, female   DOB: 1960-05-23, 54 y.o.   MRN: [id number]    Patient states that she has not been feeling well "It burns and hurts when I pee; it is dark and stinks.  The odor was so bad yesterday I had to take a shower.   Patient also complains of frequency.  Patient is laying in bed.  States that she has not been able to get out of bed or go to the cafeteria because of he back and hip pain.    Patient was treated for urinary tract infection 11/23/17 with Macrodantin but symptoms has gotten worsen.  Since treatment there has also change in lab   CBC Latest Ref Rng 11/30/2014 11/23/2014 11/26/2012  WBC 4.0 - 10.5 K/uL 20.0(H) 16.9(H) 8.1  Hemoglobin 12.0 - 15.0 g/dL 11.3(L) 11.6(L) 13.8  Hematocrit 36.0 - 46.0 % 35.2(L) 36.0 41.5  Platelets 150 - 400 K/uL 342 205 -    Urinalysis    Component Value Date/Time   COLORURINE AMBER* 11/30/2014 1019   APPEARANCEUR CLOUDY* 11/30/2014 1019   LABSPEC 1.014 11/30/2014 1019   PHURINE 6.0 11/30/2014 1019   GLUCOSEU NEGATIVE 11/30/2014 1019   HGBUR SMALL* 11/30/2014 1019   BILIRUBINUR NEGATIVE 11/30/2014 1019   BILIRUBINUR neg 04/21/2013 1623   KETONESUR 40* 11/30/2014 1019   PROTEINUR 30* 11/30/2014 1019   PROTEINUR neg 04/21/2013 1623   UROBILINOGEN 4.0* 11/30/2014 1019   UROBILINOGEN negative 04/21/2013 1623   NITRITE POSITIVE* 11/30/2014 1019   NITRITE neg 04/21/2013 1623   LEUKOCYTESUR TRACE* 11/30/2014 1019    Psychiatric Specialty Exam: Physical Exam  Constitutional: She is oriented to person, place, and time.  Neck: Normal range of motion.  Respiratory: Effort normal.  GI: She exhibits no distension. There is CVA tenderness.  Musculoskeletal: Normal range of motion.  Neurological: She is alert and oriented to person, place, and time.    Review of Systems  Constitutional: Positive for malaise/fatigue and diaphoresis.  Gastrointestinal: Positive for abdominal pain.  Genitourinary: Positive for dysuria,  frequency and flank pain.  Musculoskeletal: Positive for back pain.    Blood pressure 100/65, pulse 99, temperature 99.5 F (37.5 C), temperature source Oral, resp. rate 16, height  (1.6 m), weight 75.297 kg (166 lb), SpO2 90 %.Body mass index is 29.41 kg/(m^2).   Will order  Levaquin 750 mg daily for 5 days for complicated UTI  Addendum:  Instead of starting antibiotic at this time will send patient to ED for evaluation/treatment and IV fluids.  Shuvon B. Rankin FNP-BC   I agree with assessment and plan Madie Reno A. Dub Mikes, M.D.

## 2014-12-01 NOTE — Progress Notes (Addendum)
ANTIBIOTIC CONSULT NOTE - INITIAL  Pharmacy Consult for Rocephin Indication: UTI  Allergies  Allergen Reactions  . Codeine Nausea And Vomiting and Rash    Rash all over body; severe nausea and vomiting.    Patient Measurements: Height: 5\' 3"  (160 cm) Weight: 166 lb (75.297 kg) IBW/kg (Calculated) : 52.4   Vital Signs: Temp: 98.2 F (36.8 C) (09/16 1402) Temp Source: Oral (09/16 1402) BP: 99/63 mmHg (09/16 1402) Pulse Rate: 96 (09/16 1402) Intake/Output from previous day:   Intake/Output from this shift:    Labs:  Recent Labs  11/30/14 1945  WBC 20.0*  HGB 11.3*  PLT 342  CREATININE 0.76   Estimated Creatinine Clearance: 78.2 mL/min (by C-G formula based on Cr of 0.76). No results for input(s): VANCOTROUGH, VANCOPEAK, VANCORANDOM, GENTTROUGH, GENTPEAK, GENTRANDOM, TOBRATROUGH, TOBRAPEAK, TOBRARND, AMIKACINPEAK, AMIKACINTROU, AMIKACIN in the last 72 hours.   Microbiology: Recent Results (from the past 720 hour(s))  Urine culture     Status: None   Collection Time: 11/23/14  7:30 PM  Result Value Ref Range Status   Specimen Description URINE, CLEAN CATCH  Final   Special Requests NONE  Final   Culture   Final    >=100,000 COLONIES/mL KLEBSIELLA PNEUMONIAE Performed at [facility]    Report Status 11/26/2014 FINAL  Final   Organism ID, Bacteria KLEBSIELLA PNEUMONIAE  Final      Susceptibility   Klebsiella pneumoniae - MIC*    AMPICILLIN 16 RESISTANT Resistant     CEFAZOLIN <=4 SENSITIVE Sensitive     CEFTRIAXONE <=1 SENSITIVE Sensitive     CIPROFLOXACIN <=0.25 SENSITIVE Sensitive     GENTAMICIN <=1 SENSITIVE Sensitive     IMIPENEM <=0.25 SENSITIVE Sensitive     NITROFURANTOIN 32 SENSITIVE Sensitive     TRIMETH/SULFA <=20 SENSITIVE Sensitive     AMPICILLIN/SULBACTAM 4 SENSITIVE Sensitive     PIP/TAZO <=4 SENSITIVE Sensitive     * >=100,000 COLONIES/mL KLEBSIELLA PNEUMONIAE  Urine culture     Status: None   Collection Time: 11/24/14  7:45 AM   Result Value Ref Range Status   Specimen Description   Final    URINE, RANDOM Performed at [facility]    Special Requests   Final    NONE Performed at [facility]    Culture   Final    NO GROWTH 1 DAY Performed at [facility]    Report Status 11/26/2014 FINAL  Final    Medical History: Past Medical History  Diagnosis Date  . Knee pain   . Chronic pain   . Wears glasses   . Wears dentures     upper  . GERD (gastroesophageal reflux disease)     occ  . Depression   . Arthritis   . Hyperlipidemia   . Anxiety     Medications:  Scheduled:  . atorvastatin  20 mg Oral Daily  . DULoxetine  90 mg Oral Daily  . famotidine  10 mg Oral Daily  . feeding supplement (ENSURE ENLIVE)  237 mL Oral BID BM  . gabapentin  600 mg Oral TID WC & HS  . ketorolac  30 mg Intravenous Once  . levofloxacin  750 mg Oral Daily  . meloxicam  15 mg Oral Daily  . nicotine  21 mg Transdermal Daily  . nitrofurantoin (macrocrystal-monohydrate)  100 mg Oral Q12H  . traZODone  100 mg Oral QHS   Infusions:  . sodium chloride    . cefTRIAXone (ROCEPHIN)  IV    . sodium chloride     Followed by  . sodium chloride    . sodium chloride     PRN: acetaminophen, albuterol, alum & mag hydroxide-simeth, carisoprodol, guaifenesin, hydrOXYzine, iohexol, magnesium hydroxide, ondansetron, oxyCODONE Assessment: 53[facility]F [facility] whose UA yesterday showed likely urinary infection with significant leukocytosis. Pt is complaining of increased abdominal and lower back pain. Pt had Urine Cx collected on 9/8 growing Klebsiella which is sensitive to Rocephin. Rocephin to be started today.  Goal of Therapy:  Eradication of infection  Plan:  Will give Rocephin 1gm IV Q24h  Will sign off. Please re-consult if needed.  Dorethea Clan 12/01/2014,3:42 PM   9/16  Talked to Dr. Hyacinth Meeker and indication for Rocephin is for urosepsis. Will change Rocephin to 2gm IV  Q24h.  Thanks, Dorethea Clan, PharmD

## 2014-12-01 NOTE — ED Notes (Signed)
Admitting MD at bedside.

## 2014-12-01 NOTE — Progress Notes (Signed)
CSW was consulted to speak with patient regarding homelessness.  CSW met with patient at bedside. There was no family present. Nurse and Nurse tech were present. Patient confirms that she is homeless. Patient informed CSW that she is now receiving treatment at [facility]. However, she states prior to being accepted to [facility] she was living in a trailer but lost it.  CSW offered patient resources for shelter and food pantries. Patient accepted.    Willette Brace [phone] ED CSW 12/01/2014 5:11 PM

## 2014-12-01 NOTE — ED Notes (Signed)
Nurse will draw labs. 

## 2014-12-01 NOTE — Progress Notes (Signed)
D: Patient continues to complain of severe pain.  She reports passive SI with no specific plan.  She has been lying in bed all morning, only coming to med window for meds.  She is not attending groups and is not vested in her treatment.  She denies HI/AVH.  Informed patient we were going to have a good day, she stated, "what's so good about it."  She presents with flat affect and depressed mood.   A: Continue to monitor medication management and MD orders.  Safety checks completed every 15 minutes per protocol.  Offer support and encouragement as needed. R: Patient remains isolative to room.

## 2014-12-01 NOTE — ED Notes (Signed)
Pt reports bila flank pain with urinary frequency x 1 week.  Pt sent from [facility].  States they have given her abx without relief.  Pt reports pain in her back is worse with movement or with inspiration.

## 2014-12-01 NOTE — Progress Notes (Signed)
[facility] MD Progress Note  12/01/2014 1:37 PM Summer Estrada  MRN:  [id number] Subjective:  Summer Estrada has continued to endorse pain in her back. Did not sleep too well last night (as per her roommate restless, moaning) she has not been able to get up much to go to groups and has not been eating well. She was given some Oxycodone IR in lew of the Tramadol.  Principal Problem: Cocaine dependence with cocaine-induced mood disorder Diagnosis:   Patient Active Problem List   Diagnosis Date Noted  . Cocaine dependence with cocaine-induced mood disorder [F14.24] 11/27/2014  . GAD (generalized anxiety disorder) [F41.1] 11/27/2014  . Depression, major, recurrent, moderate [F33.1] 11/27/2014  . Cocaine abuse, continuous use [F14.10] 11/24/2014  . Low back pain [M54.5] 04/21/2013  . Hyperlipidemia [E78.5]   . GERD (gastroesophageal reflux disease) [K21.9]   . Arthritis [M19.90]   . Knee pain [M25.569]   . Wears dentures [Z98.811]   . Chronic pain [G89.29]   . Wears glasses [Z97.3]   . Trochanteric bursitis of right hip [M70.61] 06/22/2012  . Multinodular goiter [E04.2] 05/19/2012  . Medial meniscus tear [S83.249A] 05/07/2012  . Smoker [Z72.0] 04/14/2012  . Eczema [L30.9] 04/14/2012  . Hyperthyroidism [E05.90] 04/14/2012  . Hypotension [I95.9] 04/14/2012  . Cervical spinal stenosis [M48.02] 03/18/2012  . Insomnia [G47.00] 02/20/2012  . Osteoarthritis of both knees [M17.0] 08/08/2011  . Anxiety and depression [F41.8] 08/08/2011  . Cervical spondylosis [M47.812] 06/23/2011  . Patellar tendonitis [M76.50] 06/23/2011  . Myalgia and myositis, unspecified [M79.1, M60.9] 06/23/2011  . Calcifying tendinitis of shoulder [M75.30] 06/23/2011   Total Time spent with patient: 30 minutes   Past Medical History:  Past Medical History  Diagnosis Date  . Knee pain   . Chronic pain   . Wears glasses   . Wears dentures     upper  . GERD (gastroesophageal reflux disease)     occ  . Depression   .  Arthritis   . Hyperlipidemia   . Anxiety     Past Surgical History  Procedure Laterality Date  . Total hip arthroplasty  2010    lt hip  . Cesarean section      x2  . Tubal ligation     Family History:  Family History  Problem Relation Age of Onset  . Kidney disease Mother   . Heart disease Father    Social History:  History  Alcohol Use No     History  Drug Use  . Yes  . Special: Cocaine    Social History   Social History  . Marital Status: Widowed    Spouse Name: N/A  . Number of Children: N/A  . Years of Education: N/A   Social History Main Topics  . Smoking status: Current Every Day Smoker -- 1.50 packs/day  . Smokeless tobacco: Never Used  . Alcohol Use: No  . Drug Use: Yes    Special: Cocaine  . Sexual Activity: Yes    Birth Control/ Protection: Post-menopausal     Comment: none   Other Topics Concern  . None   Social History Narrative   WIDOW   CHILDREN; ADULT GIRLS; 3   DISABLED   Additional History:    Sleep: Poor  Appetite:  Poor   Assessment:   Musculoskeletal: Strength & Muscle Tone: within normal limits Gait & Station: affected by her knee and hip pain Patient leans: normal   Psychiatric Specialty Exam: Physical Exam  Review of Systems  Constitutional: Positive for malaise/fatigue.  Eyes: Negative.   Respiratory: Positive for cough and shortness of breath.   Cardiovascular: Negative.   Gastrointestinal: Negative.   Genitourinary: Positive for dysuria, urgency and flank pain.  Musculoskeletal: Positive for back pain and neck pain.  Skin: Negative.   Neurological: Positive for dizziness and weakness.  Endo/Heme/Allergies: Negative.   Psychiatric/Behavioral: Positive for depression and substance abuse. The patient is nervous/anxious and has insomnia.     Blood pressure 100/65, pulse 99, temperature 99.5 F (37.5 C), temperature source Oral, resp. rate 16, height $RemoveBe'5\' 3"'BfKyawXNW$  (1.6 m), weight 75.297 kg (166 lb), SpO2 90 %.Body mass  index is 29.41 kg/(m^2).  General Appearance: Fairly Groomed  Engineer, water::  Fair  Speech:  Clear and Coherent  Volume:  fluctuates  Mood:  Anxious, Depressed, Dysphoric and in pain  Affect:  anxious worried in pain  Thought Process:  Coherent and Goal Directed  Orientation:  Full (Time, Place, and Person)  Thought Content:  symptoms events worries concerns  Suicidal Thoughts:  on and off when she thinks about her situation  Homicidal Thoughts:  No  Memory:  Immediate;   Fair Recent;   Fair Remote;   Fair  Judgement:  Fair  Insight:  Present and Shallow  Psychomotor Activity:  Restlessness  Concentration:  Fair  Recall:  [employer] of Knowledge:Fair  Language: Fair  Akathisia:  No  Handed:  Right  AIMS (if indicated):     Assets:  Desire for Improvement  ADL's:  Intact  Cognition: WNL  Sleep:  Number of Hours: 6.75     Current Medications: Current Facility-Administered Medications  Medication Dose Route Frequency Provider Last Rate Last Dose  . acetaminophen (TYLENOL) tablet 650 mg  650 mg Oral Q6H PRN Encarnacion Slates, NP   650 mg at 11/28/14 0258  . albuterol (PROVENTIL HFA;VENTOLIN HFA) 108 (90 BASE) MCG/ACT inhaler 2 puff  2 puff Inhalation Q6H PRN Nicholaus Bloom, MD   2 puff at 11/30/14 2245  . alum & mag hydroxide-simeth (MAALOX/MYLANTA) 200-200-20 MG/5ML suspension 30 mL  30 mL Oral Q4H PRN Encarnacion Slates, NP      . atorvastatin (LIPITOR) tablet 20 mg  20 mg Oral Daily Encarnacion Slates, NP   20 mg at 12/01/14 0806  . carisoprodol (SOMA) tablet 350 mg  350 mg Oral Q6H PRN Nicholaus Bloom, MD   350 mg at 12/01/14 0998  . DULoxetine (CYMBALTA) DR capsule 90 mg  90 mg Oral Daily Nicholaus Bloom, MD   90 mg at 12/01/14 0806  . famotidine (PEPCID) tablet 10 mg  10 mg Oral Daily Encarnacion Slates, NP   10 mg at 12/01/14 0805  . feeding supplement (ENSURE ENLIVE) (ENSURE ENLIVE) liquid 237 mL  237 mL Oral BID BM Nicholaus Bloom, MD   237 mL at 12/01/14 0813  . gabapentin (NEURONTIN) capsule  600 mg  600 mg Oral TID WC & HS Nicholaus Bloom, MD   600 mg at 12/01/14 1255  . guaifenesin (ROBITUSSIN) 100 MG/5ML syrup 200 mg  200 mg Oral Q6H PRN Nicholaus Bloom, MD   200 mg at 11/29/14 1731  . hydrOXYzine (ATARAX/VISTARIL) tablet 50 mg  50 mg Oral Q4H PRN Encarnacion Slates, NP   50 mg at 11/30/14 2242  . levofloxacin (LEVAQUIN) tablet 750 mg  750 mg Oral Daily Kerrie Buffalo, NP      . magnesium hydroxide (MILK OF MAGNESIA) suspension 30 mL  30 mL Oral Daily PRN Herbert Pun  I Nwoko, NP      . meloxicam (MOBIC) tablet 15 mg  15 mg Oral Daily Encarnacion Slates, NP   15 mg at 12/01/14 0805  . nicotine (NICODERM CQ - dosed in mg/24 hours) patch 21 mg  21 mg Transdermal Daily Nicholaus Bloom, MD   21 mg at 12/01/14 0804  . nitrofurantoin (macrocrystal-monohydrate) (MACROBID) capsule 100 mg  100 mg Oral Q12H Jenne Campus, MD   100 mg at 12/01/14 0806  . ondansetron (ZOFRAN-ODT) disintegrating tablet 4 mg  4 mg Oral Q8H PRN Nicholaus Bloom, MD   4 mg at 11/28/14 1322  . oxyCODONE (Oxy IR/ROXICODONE) immediate release tablet 5 mg  5 mg Oral Q4H PRN Nicholaus Bloom, MD   5 mg at 12/01/14 0326  . traZODone (DESYREL) tablet 100 mg  100 mg Oral QHS Encarnacion Slates, NP   100 mg at 11/30/14 2242    Lab Results:  Results for orders placed or performed during the hospital encounter of 11/24/14 (from the past 48 hour(s))  Urinalysis, Routine w reflex microscopic (not at [facility])     Status: Abnormal   Collection Time: 11/30/14 10:19 AM  Result Value Ref Range   Color, Urine AMBER (A) YELLOW    Comment: BIOCHEMICALS MAY BE AFFECTED BY COLOR   APPearance CLOUDY (A) CLEAR   Specific Gravity, Urine 1.014 1.005 - 1.030   pH 6.0 5.0 - 8.0   Glucose, UA NEGATIVE NEGATIVE mg/dL   Hgb urine dipstick SMALL (A) NEGATIVE   Bilirubin Urine NEGATIVE NEGATIVE   Ketones, ur 40 (A) NEGATIVE mg/dL   Protein, ur 30 (A) NEGATIVE mg/dL   Urobilinogen, UA 4.0 (H) 0.0 - 1.0 mg/dL   Nitrite POSITIVE (A) NEGATIVE   Leukocytes, UA TRACE (A)  NEGATIVE    Comment: Performed at [facility]  Urine microscopic-add on     Status: Abnormal   Collection Time: 11/30/14 10:19 AM  Result Value Ref Range   Squamous Epithelial / LPF MANY (A) RARE   WBC, UA 7-10 <3 WBC/hpf   Bacteria, UA MANY (A) RARE    Comment: Performed at [facility]  CBC with Differential/Platelet     Status: Abnormal   Collection Time: 11/30/14  7:45 PM  Result Value Ref Range   WBC 20.0 (H) 4.0 - 10.5 K/uL   RBC 4.26 3.87 - 5.11 MIL/uL   Hemoglobin 11.3 (L) 12.0 - 15.0 g/dL   HCT 35.2 (L) 36.0 - 46.0 %   MCV 82.6 78.0 - 100.0 fL   MCH 26.5 26.0 - 34.0 pg   MCHC 32.1 30.0 - 36.0 g/dL   RDW 14.6 11.5 - 15.5 %   Platelets 342 150 - 400 K/uL   Neutrophils Relative % 80 %   Neutro Abs 15.9 (H) 1.7 - 7.7 K/uL   Lymphocytes Relative 12 %   Lymphs Abs 2.4 0.7 - 4.0 K/uL   Monocytes Relative 8 %   Monocytes Absolute 1.6 (H) 0.1 - 1.0 K/uL   Eosinophils Relative 0 %   Eosinophils Absolute 0.1 0.0 - 0.7 K/uL   Basophils Relative 0 %   Basophils Absolute 0.0 0.0 - 0.1 K/uL    Comment: Performed at [facility]  Comprehensive metabolic panel     Status: Abnormal   Collection Time: 11/30/14  7:45 PM  Result Value Ref Range   Sodium 134 (L) 135 - 145 mmol/L   Potassium 4.0 3.5 - 5.1 mmol/L  Chloride 94 (L) 101 - 111 mmol/L   CO2 29 22 - 32 mmol/L   Glucose, Bld 148 (H) 65 - 99 mg/dL   BUN 10 6 - 20 mg/dL   Creatinine, Ser 0.76 0.44 - 1.00 mg/dL   Calcium 9.0 8.9 - 10.3 mg/dL   Total Protein 7.2 6.5 - 8.1 g/dL   Albumin 3.0 (L) 3.5 - 5.0 g/dL   AST 29 15 - 41 U/L   ALT 28 14 - 54 U/L   Alkaline Phosphatase 85 38 - 126 U/L   Total Bilirubin 0.5 0.3 - 1.2 mg/dL   GFR calc non Af Amer >60 >60 mL/min   GFR calc Af Amer >60 >60 mL/min    Comment: (NOTE) The eGFR has been calculated using the CKD EPI equation. This calculation has not been validated in all clinical situations. eGFR's persistently <60  mL/min signify possible Chronic Kidney Disease.    Anion gap 11 5 - 15    Comment: Performed at [facility]    Physical Findings: AIMS: Facial and Oral Movements Muscles of Facial Expression: None, normal Lips and Perioral Area: None, normal Jaw: None, normal Tongue: None, normal,Extremity Movements Upper (arms, wrists, hands, fingers): None, normal Lower (legs, knees, ankles, toes): None, normal, Trunk Movements Neck, shoulders, hips: None, normal, Overall Severity Severity of abnormal movements (highest score from questions above): None, normal Incapacitation due to abnormal movements: None, normal Patient's awareness of abnormal movements (rate only patient's report): No Awareness, Dental Status Current problems with teeth and/or dentures?: No Does patient usually wear dentures?: Yes (upper dentures)  CIWA:  CIWA-Ar Total: 3 COWS:  COWS Total Score: 3  Treatment Plan Summary: Daily contact with patient to assess and evaluate symptoms and progress in treatment and Medication management Supportive approach/coping skills Cocaine dependence; work a relapse prevention plan Depression; continue to work with the Cymbalta at 90 mg Pain; continue to work with the Neurontin the Mobic, the Oxy IR, the SOMA Labs; increased in WBC, worsening of the UTI UTI; not improved and seems worst after a course with Macrodantin NP to assess Medical Decision Making:  Review of Psycho-Social Stressors (1), Review or order clinical lab tests (1), Review of Medication Regimen & Side Effects (2) and Review of New Medication or Change in Dosage (2)     Zell Doucette A 12/01/2014, 1:37 PM

## 2014-12-01 NOTE — ED Provider Notes (Signed)
CSN: [id number]     Arrival date & time 12/01/14  1358 History   First MD Initiated Contact with Patient 12/01/14 1507     Chief Complaint  Patient presents with  . Flank Pain     (Consider location/radiation/quality/duration/timing/severity/associated sxs/prior Treatment) HPI Comments: Summer Estrada is a 54 y.o F with a long-standing history of crack cocaine abuse, psych disorders and recent suicide attempt, sent here from [facility] complaining of bilateral flank pain and urinary frequency 1 week. Patient was recently diagnosed with UTI and placed on Macrobid. Patient reports pain has gotten worse. Pain is located in the bilateral flank area. Pain is 10/10. Denies dysuria and hematuria. However patient states "it feels hot when I pee and I have to go a lot more often". Patient also complaining of nausea without vomiting. Denies fever, chills, headache, abdominal pain, vaginal discharge, vaginal bleeding, numbness, weakness, shortness of breath, diarrhea.  Patient last crack cocaine use was 2 weeks ago. Patient endorses suicidal ideation at this time with a plan to overdose. Denies homicidal ideation, hallucinations.  Patient is a 54 y.o. female presenting with flank pain. The history is provided by the patient.  Flank Pain    Past Medical History  Diagnosis Date  . Knee pain   . Chronic pain   . Wears glasses   . Wears dentures     upper  . GERD (gastroesophageal reflux disease)     occ  . Depression   . Arthritis   . Hyperlipidemia   . Anxiety    Past Surgical History  Procedure Laterality Date  . Total hip arthroplasty  2010    lt hip  . Cesarean section      x2  . Tubal ligation     Family History  Problem Relation Age of Onset  . Kidney disease Mother   . Heart disease Father    Social History  Substance Use Topics  . Smoking status: Current Every Day Smoker -- 1.50 packs/day  . Smokeless tobacco: Never Used  . Alcohol Use: No   OB History    No data available      Review of Systems  Genitourinary: Positive for flank pain.  All other systems reviewed and are negative.     Allergies  Codeine  Home Medications   Prior to Admission medications   Medication Sig Start Date End Date Taking? Authorizing Provider  aspirin EC 81 MG tablet Take 81 mg by mouth daily.    Historical Provider, MD  atorvastatin (LIPITOR) 20 MG tablet TAKE ONE TABLET BY MOUTH EVERY DAY 10/18/14   Ernestina Penna, MD  buPROPion [facility]) 100 MG tablet TAKE ONE TABLET BY MOUTH 3 TIMES DAILY 10/18/14   Ernestina Penna, MD  carisoprodol (SOMA) 350 MG tablet TAKE ONE TABLET BY MOUTH EVERY 8 HOURS AS NEEDED Patient not taking: Reported on 11/23/2014 10/11/14   Junie Spencer, FNP  diclofenac sodium (VOLTAREN) 1 % GEL Apply 2 g topically 4 (four) times daily. 07/19/12   Clydie Braun Prueter, PA-C  FLUoxetine (PROZAC) 40 MG capsule Take 1 capsule (40 mg total) by mouth daily. 05/15/14   Junie Spencer, FNP  gabapentin (NEURONTIN) 300 MG capsule TAKE 2 CAPSULES BY MOUTH 3 TIMES DAILY 10/18/14   Ernestina Penna, MD  meloxicam (MOBIC) 15 MG tablet TAKE 1 TABLET BY MOUTH EVERY DAY 11/22/13   Junie Spencer, FNP  ranitidine (ZANTAC) 150 MG tablet Take 150 mg by mouth daily.    Historical Provider, MD  traZODone (DESYREL) 50 MG tablet TAKE 1 TABLET BY MOUTH EVERY NIGHT AT BEDTIME 09/11/14   Mary-Margaret Daphine Deutscher, FNP   BP 99/63 mmHg  Pulse 96  Temp(Src) 98.2 F (36.8 C) (Oral)  Resp 18  Ht  (1.6 m)  Wt 166 lb (75.297 kg)  BMI 29.41 kg/m2  SpO2 94% Physical Exam  Constitutional: She is oriented to person, place, and time. She appears well-developed and well-nourished. She appears distressed.  HENT:  Head: Normocephalic and atraumatic.  Mouth/Throat: Oropharynx is clear and moist. No oropharyngeal exudate.  Eyes: Conjunctivae and EOM are normal. Pupils are equal, round, and reactive to light. Right eye exhibits no discharge. Left eye exhibits no discharge. No scleral icterus.  Neck: Normal  range of motion. Neck supple. No tracheal deviation present. No thyromegaly present.  Cardiovascular: Normal rate, regular rhythm, normal heart sounds and intact distal pulses.  Exam reveals no gallop and no friction rub.   No murmur heard. Pulmonary/Chest: Effort normal and breath sounds normal. No respiratory distress. She has no wheezes. She has no rales. She exhibits no tenderness.  Abdominal: Soft. Bowel sounds are normal. She exhibits no distension. There is tenderness. There is no guarding.  Bilateral CVA tenderness. Exquisite tenderness to palpation of right upper and right lower quadrant. No peritoneal signs.  Musculoskeletal: Normal range of motion. She exhibits no edema or tenderness.  Lymphadenopathy:    She has no cervical adenopathy.  Neurological: She is alert and oriented to person, place, and time. No cranial nerve deficit.  Strength 5/5 throughout. No sensory deficits.  Negative finger to nose. No pronator drift.  Skin: Skin is warm and dry. No rash noted. She is not diaphoretic. No erythema. No pallor.  Psychiatric: She has a normal mood and affect. Her behavior is normal.  Nursing note and vitals reviewed.   ED Course  Procedures (including critical care time)   Labs Review Labs Reviewed  URINALYSIS W MICROSCOPIC - Abnormal; Notable for the following:    APPearance CLOUDY (*)    Protein, ur 30 (*)    Urobilinogen, UA >8.0 (*)    Leukocytes, UA SMALL (*)    Bacteria, UA FEW (*)    Squamous Epithelial / LPF MANY (*)    All other components within normal limits  CBC WITH DIFFERENTIAL/PLATELET - Abnormal; Notable for the following:    WBC 20.0 (*)    Hemoglobin 11.3 (*)    HCT 35.2 (*)    Neutro Abs 15.9 (*)    Monocytes Absolute 1.6 (*)    All other components within normal limits  COMPREHENSIVE METABOLIC PANEL - Abnormal; Notable for the following:    Sodium 134 (*)    Chloride 94 (*)    Glucose, Bld 148 (*)    Albumin 3.0 (*)    All other components  within normal limits  URINALYSIS, ROUTINE W REFLEX MICROSCOPIC (NOT AT [facility]) - Abnormal; Notable for the following:    Color, Urine AMBER (*)    APPearance CLOUDY (*)    Hgb urine dipstick SMALL (*)    Ketones, ur 40 (*)    Protein, ur 30 (*)    Urobilinogen, UA 4.0 (*)    Nitrite POSITIVE (*)    Leukocytes, UA TRACE (*)    All other components within normal limits  URINE MICROSCOPIC-ADD ON - Abnormal; Notable for the following:    Squamous Epithelial / LPF MANY (*)    Bacteria, UA MANY (*)    All other components within normal  limits  COMPREHENSIVE METABOLIC PANEL - Abnormal; Notable for the following:    Sodium 132 (*)    Chloride 95 (*)    Glucose, Bld 110 (*)    Calcium 8.2 (*)    Total Protein 6.3 (*)    Albumin 2.5 (*)    All other components within normal limits  CBC WITH DIFFERENTIAL/PLATELET - Abnormal; Notable for the following:    WBC 20.1 (*)    RBC 3.62 (*)    Hemoglobin 9.5 (*)    HCT 29.8 (*)    Neutro Abs 16.2 (*)    Monocytes Absolute 1.7 (*)    All other components within normal limits  URINALYSIS, ROUTINE W REFLEX MICROSCOPIC (NOT AT [facility]) - Abnormal; Notable for the following:    Color, Urine ORANGE (*)    APPearance TURBID (*)    Hgb urine dipstick TRACE (*)    Bilirubin Urine SMALL (*)    Ketones, ur 15 (*)    Protein, ur 100 (*)    Urobilinogen, UA 4.0 (*)    Nitrite POSITIVE (*)    Leukocytes, UA MODERATE (*)    All other components within normal limits  LIPASE, BLOOD - Abnormal; Notable for the following:    Lipase 12 (*)    All other components within normal limits  URINE MICROSCOPIC-ADD ON - Abnormal; Notable for the following:    Squamous Epithelial / LPF MANY (*)    Bacteria, UA MANY (*)    All other components within normal limits  URINE CULTURE  CULTURE, BLOOD (ROUTINE X 2)  CULTURE, BLOOD (ROUTINE X 2)  URINE CULTURE  TROPONIN I  I-STAT CG4 LACTIC ACID, ED  I-STAT CG4 LACTIC ACID, ED    Imaging Review Dg Chest 2  View  12/01/2014   CLINICAL DATA:  Chest pain, bilateral flank pain  EXAM: CHEST  2 VIEW  COMPARISON:  None.  FINDINGS: Cardiac size is unremarkable. No pulmonary edema. There is infiltrate or infiltrative process in the left upper lobe suprahilar region medially. Further correlation with enhanced CT scan of the chest is recommended.  IMPRESSION: Infiltrate or infiltrative process in left upper lobe suprahilar region. Further evaluation with enhanced CT scan of the chest is recommended.   Electronically Signed   By: Natasha Mead M.D.   On: 12/01/2014 17:32   Ct Chest W Contrast  12/01/2014   CLINICAL DATA:  Chest pain and bilateral flank pain. Abnormal chest x-ray.  EXAM: CT CHEST, ABDOMEN, AND PELVIS WITH CONTRAST  TECHNIQUE: Multidetector CT imaging of the chest, abdomen and pelvis was performed following the standard protocol during bolus administration of intravenous contrast.  CONTRAST:  25mL OMNIPAQUE IOHEXOL 300 MG/ML SOLN, OMNIPAQUE IOHEXOL 300 MG/ML SOLN  COMPARISON:  Chest x-ray, same date.  FINDINGS: CT CHEST FINDINGS  Chest wall: No breast masses, supraclavicular or axillary adenopathy. Asymmetric breast tissue noted on the left. Nodular thyroid goiter noted. The bony thorax is intact. No destructive bone lesions or spinal canal compromise.  Mediastinum: The heart is normal in size. No pericardial effusion. Scattered mediastinal lymph nodes. 6 mm lymph node located between the left brachiocephalic vein and the left carotid artery on image 12. 7 mm prevascular lymph node on image number 16. There are smaller scattered pretracheal and aortic O pulmonary window nodes.  The aorta is normal in caliber. No dissection. The esophagus is grossly normal.  Lungs/ pleura: Cavitary left upper lobe lung process is most likely pneumonia with a lung abscess. There is significant  surrounding interstitial change in the left upper lobe. Somewhat patchy rounded airspace opacity in the left upper lobe on image 21 is  also likely pneumonia and less likely an nodule. The remainder of the lungs are clear. No pulmonary nodules or pleural effusion. There are underlying emphysematous changes.  CT ABDOMEN AND PELVIS FINDINGS  Hepatobiliary: No focal hepatic lesions or intrahepatic biliary dilatation. The gallbladder demonstrates a calcified gallstone. No findings for acute cholecystitis. No common bile duct dilatation.  Pancreas: No mass, inflammation or ductal dilatation.  Spleen: Normal size.  No focal lesions.  Adrenals/Urinary Tract: The adrenal glands and kidneys are unremarkable. There are scarring changes involving the upper pole region of the left kidney.  Stomach/Bowel: The stomach, duodenum, small bowel and colon are unremarkable. No inflammatory changes, mass lesions or obstructive findings. The terminal ileum is normal. The appendix is normal.  Vascular/Lymphatic: No mesenteric or retroperitoneal mass or adenopathy. The aorta is normal in caliber. Moderate scattered atherosclerotic calcifications. The branch vessels are patent. The major venous structures are patent.  Other: The uterus and ovaries are unremarkable. A small calcified fibroid is noted. The bladder is normal. No pelvic mass, adenopathy or free pelvic fluid collections. No inguinal mass or adenopathy.  Musculoskeletal: No significant bony findings. A left hip prosthesis is noted.  IMPRESSION: Extensive necrotizing left upper lobe pneumonia with cavitations/lung abscesses. Adjacent inflammatory mediastinal lymph nodes.  Mild underlying emphysematous changes.  No acute abdominal/ pelvic findings, mass lesions or adenopathy.  Cholelithiasis.   Electronically Signed   By: Rudie Meyer M.D.   On: 12/01/2014 18:20   Ct Abdomen Pelvis W Contrast  12/01/2014   CLINICAL DATA:  Chest pain and bilateral flank pain. Abnormal chest x-ray.  EXAM: CT CHEST, ABDOMEN, AND PELVIS WITH CONTRAST  TECHNIQUE: Multidetector CT imaging of the chest, abdomen and pelvis was  performed following the standard protocol during bolus administration of intravenous contrast.  CONTRAST:  25mL OMNIPAQUE IOHEXOL 300 MG/ML SOLN, OMNIPAQUE IOHEXOL 300 MG/ML SOLN  COMPARISON:  Chest x-ray, same date.  FINDINGS: CT CHEST FINDINGS  Chest wall: No breast masses, supraclavicular or axillary adenopathy. Asymmetric breast tissue noted on the left. Nodular thyroid goiter noted. The bony thorax is intact. No destructive bone lesions or spinal canal compromise.  Mediastinum: The heart is normal in size. No pericardial effusion. Scattered mediastinal lymph nodes. 6 mm lymph node located between the left brachiocephalic vein and the left carotid artery on image 12. 7 mm prevascular lymph node on image number 16. There are smaller scattered pretracheal and aortic O pulmonary window nodes.  The aorta is normal in caliber. No dissection. The esophagus is grossly normal.  Lungs/ pleura: Cavitary left upper lobe lung process is most likely pneumonia with a lung abscess. There is significant surrounding interstitial change in the left upper lobe. Somewhat patchy rounded airspace opacity in the left upper lobe on image 21 is also likely pneumonia and less likely an nodule. The remainder of the lungs are clear. No pulmonary nodules or pleural effusion. There are underlying emphysematous changes.  CT ABDOMEN AND PELVIS FINDINGS  Hepatobiliary: No focal hepatic lesions or intrahepatic biliary dilatation. The gallbladder demonstrates a calcified gallstone. No findings for acute cholecystitis. No common bile duct dilatation.  Pancreas: No mass, inflammation or ductal dilatation.  Spleen: Normal size.  No focal lesions.  Adrenals/Urinary Tract: The adrenal glands and kidneys are unremarkable. There are scarring changes involving the upper pole region of the left kidney.  Stomach/Bowel: The stomach, duodenum, small  bowel and colon are unremarkable. No inflammatory changes, mass lesions or obstructive findings. The  terminal ileum is normal. The appendix is normal.  Vascular/Lymphatic: No mesenteric or retroperitoneal mass or adenopathy. The aorta is normal in caliber. Moderate scattered atherosclerotic calcifications. The branch vessels are patent. The major venous structures are patent.  Other: The uterus and ovaries are unremarkable. A small calcified fibroid is noted. The bladder is normal. No pelvic mass, adenopathy or free pelvic fluid collections. No inguinal mass or adenopathy.  Musculoskeletal: No significant bony findings. A left hip prosthesis is noted.  IMPRESSION: Extensive necrotizing left upper lobe pneumonia with cavitations/lung abscesses. Adjacent inflammatory mediastinal lymph nodes.  Mild underlying emphysematous changes.  No acute abdominal/ pelvic findings, mass lesions or adenopathy.  Cholelithiasis.   Electronically Signed   By: Rudie Meyer M.D.   On: 12/01/2014 18:20   I have personally reviewed and evaluated these images and lab results as part of my medical decision-making.   EKG Interpretation   Date/Time:  Friday December 01 2014 16:13:25 EDT Ventricular Rate:  94 PR Interval:  155 QRS Duration: 109 QT Interval:  356 QTC Calculation: 445 R Axis:   85 Text Interpretation:  Sinus rhythm Baseline wander in lead(s) I III aVL V4  No old tracing to compare Confirmed by MILLER  MD, BRIAN ([id number]) on  12/01/2014 4:21:02 PM      MDM   Final diagnoses:  Pneumonia, organism unspecified   Pt presenting with persistent bilateral flank pain currently on macrobid.  Blood pressure is borderline at 90/60. CBC yesterday revealed WBC 20. Will initiate sepsis workup, 2g rocephin given. Flank pain is bilateral, unlikely that patient has bilateral renal stones. Concern for renal abscess, will CT with contrast.   Pt also c/o SOB and mild chest pain. Will order CXR. CXR revealed infiltrative process in LUL, recommend CT. CT chest revealed LUL necrotizing PNA. CT abd negative. Spoke with  hospitalist who will admit pt to their service. Pt given 1000 mg vanc. Pt still afebrile. All VSS, in NAD.     Lester Kinsman [place], PA-C 12/01/14 1923  Eber Hong, MD 12/02/14 [id number]

## 2014-12-01 NOTE — ED Notes (Signed)
Blood draw unsuccessful 

## 2014-12-01 NOTE — H&P (Signed)
History and Physical  Summer Estrada ZOX:[id number] DOB: 02/13/1961 DOA: 12/01/2014  Referring physician: Eber Hong, MD PCP: Rudi Heap, MD   Chief Complaint: Cough, chest pain  HPI: Summer Estrada is a 54 y.o. female with a past medical history significant for recent recurrent suicide attempt 1 week ago with sleeping pills who has been admitted to [facility] since then, as well as history of depression, anxiety, GERD and chronic pain who presents with progressive chest discomfort, cough, and fevers.  The patient has been smoking crack again for the last few months.  One week ago, she was evicted from her trailer, and she subsequently committed suicide with sleeping pills, but just passed out all night and woke up the next day.  She subsequently presented for help and was admitted to [facility] for suicide.  Although she thinks she may have had intermittent fever for months, over the past week, she has had fever, flank pain, shoulder pain, and progressive cough, pleuritic pain and shortness of breath until today she was transferred from [facility] over to [facility] ER.  In the ED, she was noted on CT to have a pneumonia with cavitating abscess.  She had leukocytosis, normal lactic acid, was hemodynamically stable, and was started on antibiotics.   Review of Systems:  Patient seen 1910 on 12/01/2014. Pt complains of chest pain, shortness of breath, diaphoresis, sweats, fever. Pt denies any purulent sputum, hemoptysis, weight loss, lymphadenopathy.  Otherwise twelve systems were reviewed and were negative except as noted above in the history of present illness.  Past Medical History  Diagnosis Date  . Knee pain   . Chronic pain   . Wears glasses   . Wears dentures     upper  . GERD (gastroesophageal reflux disease)     occ  . Depression   . Arthritis   . Hyperlipidemia   . Anxiety    Past Surgical History  Procedure Laterality Date  . Total hip arthroplasty  2010    lt hip  .  Cesarean section      x2  . Tubal ligation     Social History:  reports that she has been smoking.  She has never used smokeless tobacco. She reports that she uses illicit drugs (Cocaine). She reports that she does not drink alcohol. Patient lives in [place].  8 days ago she was evicted from her trailer and currently has no place to go.  7 days ago she attempted overdose with sleeping pills, failed/woke up, and presented to [facility] where she has been admitted since then.  She has a son who is her HCPOA.  She is an active smoker.  Denies alcohol.  Has been smoking crack for the last few weeks/months.  Allergies  Allergen Reactions  . Codeine Nausea And Vomiting and Rash    Rash all over body; severe nausea and vomiting.    Family History  Problem Relation Age of Onset  . Kidney disease Mother   . Heart disease Father     Prior to Admission medications   Medication Sig Start Date End Date Taking? Authorizing Burgundy Matuszak  aspirin EC 81 MG tablet Take 81 mg by mouth daily.   Yes Historical Henley Blyth, MD  atorvastatin (LIPITOR) 20 MG tablet TAKE ONE TABLET BY MOUTH EVERY DAY 10/18/14  Yes Ernestina Penna, MD  buPROPion [facility]) 100 MG tablet Take 100 mg by mouth 3 (three) times daily. 10/18/14  Yes Historical Jeriko Kowalke, MD  gabapentin (NEURONTIN) 300 MG capsule TAKE 2  CAPSULES BY MOUTH 3 TIMES DAILY 10/18/14  Yes Ernestina Penna, MD  meloxicam (MOBIC) 15 MG tablet TAKE 1 TABLET BY MOUTH EVERY DAY 11/22/13  Yes Junie Spencer, FNP  ranitidine (ZANTAC) 150 MG tablet Take 150 mg by mouth daily.   Yes Historical Tegan Britain, MD  traZODone (DESYREL) 50 MG tablet TAKE 1 TABLET BY MOUTH EVERY NIGHT AT BEDTIME 09/11/14  Yes Mary-Margaret Daphine Deutscher, FNP  carisoprodol (SOMA) 350 MG tablet TAKE ONE TABLET BY MOUTH EVERY 8 HOURS AS NEEDED Patient not taking: Reported on 12/01/2014 10/11/14   Junie Spencer, FNP    Physical Exam: BP 99/63 mmHg  Pulse 96  Temp(Src) 98.2 F (36.8 C) (Oral)  Resp 18  Ht 5\' 3"  (1.6 m)   Wt 166 lb (75.297 kg)  BMI 29.41 kg/m2  SpO2 94% General: Healthy, alert and in no distress.  Responds appropriately to questions.  Eye contact, dress and hygiene appropriate. HEENT: Corneas clear, conjunctivae and sclerae normal without injection or icterus, lids and lashes normal.  EOMI and PERRL.  Visual tracking smooth.  Nose normal, nasal cannula in place.  OP moist without erythema, exudates, cobblestoning, or ulcers.  No airway deformities.  Neck supple.  No cervical or axillary lymphadenopathy. Cardiac: RRR, nl S1-S2, no murmurs, rubs, gallops.  Capillary refill is less than 2 seconds.  No JVD.  No LE edema. Respiratory: Tachypnea.  Speaks in full sentences.  Rhonchi appreciated in left posterior upper fields, but no rales.  Fine crackles at both bases.  Inspiratory effort weak, poor airmovement overall.   Abdomen: BS present.  Abdomen soft.  Voluntary guarding.  Diffuse TTP, most in RLQ without rebound or mass appreciated.    No ascites, distension. Extremities: No deformities/injuries.  5/5 grip strength and upper extremity flexion/extension, symmetrically.  Extremities are warm and well-perfused. Neuro: Sensorium intact.  Speech is fluent.  Naming is grossly intact, and the patient's recall, recent and remote, as well as general fund of knowledge seem within normal limits.  Muscle tone normal, without fasciculations.  Moves all extremities equally and with normal coordination.  Attention span and concentration are within normal limits.  Psych: Tearful affect.  Normal rate and rhythm of speech.  Thought content appropriate, and thought process linear.  No apparent aural or visual hallucinations or delusions.          Labs on Admission:  Basic Metabolic Panel:  Recent Labs Lab 11/30/14 1945 12/01/14 1710  NA 134* 132*  K 4.0 3.9  CL 94* 95*  CO2 29 29  GLUCOSE 148* 110*  BUN 10 11  CREATININE 0.76 0.57  CALCIUM 9.0 8.2*   Liver Function Tests:  Recent Labs Lab  11/30/14 1945 12/01/14 1710  AST 29 33  ALT 28 30  ALKPHOS 85 76  BILITOT 0.5 0.5  PROT 7.2 6.3*  ALBUMIN 3.0* 2.5*    Recent Labs Lab 12/01/14 1710  LIPASE 12*   CBC:  Recent Labs Lab 11/30/14 1945 12/01/14 1710  WBC 20.0* 20.1*  NEUTROABS 15.9* 16.2*  HGB 11.3* 9.5*  HCT 35.2* 29.8*  MCV 82.6 82.3  PLT 342 270   Cardiac Enzymes:  Recent Labs Lab 12/01/14 1710  TROPONINI <0.03    Radiological Exams on Admission: Dg Chest 2 View Personally reviewed. 12/01/2014    Opacity in left upper lung perihilar.  Ct Chest Abdomen Pelvis W Contrast 12/01/2014  IMPRESSION: Extensive necrotizing left upper lobe pneumonia with cavitations/lung abscesses. Adjacent inflammatory mediastinal lymph nodes.  Mild underlying emphysematous changes.  No acute abdominal/ pelvic findings, mass lesions or adenopathy.  Cholelithiasis.      EKG: Independently reviewed. NSR without ST changes.  Assessment/Plan Present on Admission:  . Pneumonia with lung abscess . GERD (gastroesophageal reflux disease) . Cocaine dependence with cocaine-induced mood disorder . GAD (generalized anxiety disorder) . Depression, major, recurrent, moderate   1. Pneumonia with lung abscess: The patient is admitted from [facility] hospital with several weeks of intermittent fevers and now one week of increasing left shoulder pain, fever, and shortness of breath.  CT imaging shows a dense left upper lobe pneumonia with cavitation.  This was discussed with radiology, who felt that it was likely a lung abscess, not in a pattern typical of TB. - Ceftriaxone 1g daily - Clindamycin 600 mg IV q8hrs - Sputum culture, gram stain, fungal culture - Legionella and S pneumo UAs - Supplemental O2 as needed  2. Hyponatremia: Suspect hypovolemic.   - Trend BMP  3. History of recent suicide attempt: Continue duloxetine 90 mg PO once daily started by Dr. Dub Mikes - Suicide precautions  4. Chronic medical conditions: Continue  baby aspirin and statin for CV prevention Continue carisoprodol, home gabapentin and oxycodone started by Dr. Dub Mikes for pain Continue trazodone for sleep Continue nicotine patch  5. History of hypotension: The patient has "hypotension" listed in her problem list as a chronic problem.  At admission, her BP was 99/63 without tachycardia and with normal mentation.    DVT PPx: Lovenox  Diet: Regular  Code Status: Full  Disposition Plan:  The appropriate admission status for this patient is INPATIENT. Inpatient status is judged to be reasonable and necessary in order to provide the required intensity of service to ensure the patient's safety. The patient's presenting symptoms, physical exam findings, and initial radiographic and laboratory data in the context of their chronic comorbidities is felt to place them at high risk for further clinical deterioration. Furthermore, it is not anticipated that the patient will be medically stable for discharge from the hospital within 2 midnights of admission. The following factors support the admission status of inpatient.   A. The patient's presenting symptoms include fever, cough, dyspnea. B. The worrisome physical exam findings include decreased lung sounds. C. The initial radiographic and laboratory data are worrisome because of lung abscess. D. The chronic co-morbidities include recent suicide attempt, depression, crack use. E. Patient requires inpatient status due to high intensity of service, high risk for further deterioration and high frequency of surveillance required. F. I certify that at the point of admission it is my clinical judgment that the patient will require inpatient hospital care spanning beyond 2 midnights from the point of admission.     Alberteen Sam [facility] Pager [phone]

## 2014-12-01 NOTE — BHH Group Notes (Signed)
[facility] LCSW Aftercare Discharge Planning Group Note   12/01/2014 10:17 AM  Participation Quality:  Invited-DID NOT ATTEND. Pt chose to remain in bed.   [employer]

## 2014-12-01 NOTE — ED Notes (Signed)
Lab draw unsucessful rn made aware

## 2014-12-01 NOTE — ED Provider Notes (Signed)
The patient is a 54 year old female, [facility] after she had been admitted for substance abuse, started to get sick a couple of days ago, yesterday had labs and a urinalysis which showed likely urinary infection with a significant leukocytosis. She has continued to have increased pain both in the abdomen and the lower back, she states that she is having left-sided upper chest pain and a headache as well. She denies any history of IV drug use or alcohol use but does state that she uses cocaine. On my exam the patient has normal heart and lung sounds, she appears very uncomfortable and has significant tenderness in the right upper and the right lower quadrant on abdominal exam. No left lower quadrant tenderness, no peritoneal signs. No peripheral edema, normal conjunctiva, normal speech, normal neurologic exam. We'll obtain labs and a CT scan to further evaluate the source of the patient's abdominal pain, possibly worsening pyelonephritis, would also consider biliary pathology or appendicitis. The patient is in agreement with the plan and understands the process. We will keep her nothing by mouth until CT scan return.  The pt has surprisingly found to have multiple pulmonary abscesses in the lung - cavitary type lesion - will need admission for tx of this and further investigation.  Medical screening examination/treatment/procedure(s) were conducted as a shared visit with non-physician practitioner(s) and myself.  I personally evaluated the patient during the encounter.  Clinical Impression:   Final diagnoses:  Pneumonia, organism unspecified         Eber Hong, MD 12/02/14 1644
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
## 2014-12-02 DIAGNOSIS — A419 Sepsis, unspecified organism: Principal | ICD-10-CM

## 2014-12-02 LAB — EXPECTORATED SPUTUM ASSESSMENT W GRAM STAIN, RFLX TO RESP C

## 2014-12-02 LAB — BASIC METABOLIC PANEL
ANION GAP: 7 (ref 5–15)
BUN: 8 mg/dL (ref 6–20)
CHLORIDE: 102 mmol/L (ref 101–111)
CO2: 26 mmol/L (ref 22–32)
Calcium: 8.3 mg/dL — ABNORMAL LOW (ref 8.9–10.3)
Creatinine, Ser: 0.49 mg/dL (ref 0.44–1.00)
GFR calc Af Amer: >60 mL/min (ref >60)
Glucose, Bld: 103 mg/dL — ABNORMAL HIGH (ref 65–99)
POTASSIUM: 4 mmol/L (ref 3.5–5.1)
SODIUM: 135 mmol/L (ref 135–145)

## 2014-12-02 LAB — EXPECTORATED SPUTUM ASSESSMENT W REFEX TO RESP CULTURE

## 2014-12-02 LAB — STREP PNEUMONIAE URINARY ANTIGEN: STREP PNEUMO URINARY ANTIGEN: NEGATIVE

## 2014-12-02 MED ORDER — METRONIDAZOLE IN NACL 5-0.79 MG/ML-% IV SOLN
500.0000 mg | Freq: Three times a day (TID) | INTRAVENOUS | Status: DC
  Administered 2014-12-02 – 2014-12-04 (×6): 500 mg via INTRAVENOUS
  Filled 2014-12-02 (×7): qty 100

## 2014-12-02 NOTE — Progress Notes (Signed)
Summer Estrada in 1529, with Pneumonia has a temp of 102 this morning.  She is on two antibiotics.  Thanks, Marcia Brash, RN.Marland Kitchen

## 2014-12-02 NOTE — Progress Notes (Signed)
[facility] Progress Note   Summer Estrada  JXB:[id number]  DOB: 20-Apr-1960  DOA: 12/01/2014 PCP: Rudi Heap, MD  Brief narrative: Summer Estrada is a 54 y.o. female with a history of depression, gastroesophageal reflux disease, chronic pain and a suicide attempt one week ago with sleeping pills who was admitted to the behavioral [facility]. She states that she's had a cough ever since she was admitted to behavioral medicine along with chest pain. She is coughing up bloody mucus. CT scan reveals pneumonia with a cavitating abscess.   Subjective: Continues to have cough with bloody mucus-having pain across her upper back and having trouble taking a deep breath.  Assessment/Plan: Principal Problem:   Pneumonia with lung abscess- sepsis-fever 102.2, leukocytosis -Necrotizing left upper lobe pneumonia with cavitation/abscess - Possibly aspirated after she took the sleeping pills in a suicide attempt-  ---Continue Rocephin and Flagyl- sputum culture ordered -Pulmonary consult to determine duration of treatment and outpatient follow-up to ensure complete resolution has occurred  Active Problems:   GERD (gastroesophageal reflux disease) -Continue H2 blocker    Cocaine dependence with cocaine-induced mood disorder   GAD (generalized anxiety disorder)   Depression, major, recurrent, moderate Recent suicide attempt - We'll ask for psychiatry to continue to manage while admitted  Chronic pain -Continue Neurontin soma  Hyperlipidemia - Continue Lipitor-  Smoker -Nicotine patch     Code Status:     Code Status Orders        Start     Ordered   12/01/14 2221  Full code   Continuous     12/01/14 2223     Family Communication: Disposition Plan: Return to behavioral medicine when stable?  DVT prophylaxis: Lovenox  Consultants:Pulmonary critical care  Procedures:  Antibiotics: Anti-infectives    Start     Dose/Rate Route Frequency Ordered Stop    12/02/14 1700  cefTRIAXone (ROCEPHIN) 2 g in dextrose 5 % 50 mL IVPB     2 g 100 mL/hr over 30 Minutes Intravenous Every 24 hours 12/01/14 2223     12/02/14 1200  metroNIDAZOLE (FLAGYL) IVPB 500 mg     500 mg 100 mL/hr over 60 Minutes Intravenous Every 8 hours 12/02/14 0756     12/01/14 2230  clindamycin (CLEOCIN) IVPB 600 mg  Status:  Discontinued     600 mg 100 mL/hr over 30 Minutes Intravenous 3 times per day 12/01/14 2223 12/02/14 0756      Objective: Filed Weights   12/02/14 0500  Weight: 74 kg (163 lb 2.3 oz)    Intake/Output Summary (Last 24 hours) at 12/02/14 1443 Last data filed at 12/02/14 1211  Gross per 24 hour  Intake 1952.92 ml  Output    800 ml  Net 1152.92 ml     Vitals Filed Vitals:   12/01/14 2200 12/01/14 2214 12/02/14 0500 12/02/14 0656  BP: 76/53 102/62  122/66  Pulse: 80 82  91  Temp: 98.7 F (37.1 C)   102.2 F (39 C)  TempSrc: Oral   Oral  Resp: 16   18  Height:   5\' 3"  (1.6 m)   Weight:   74 kg (163 lb 2.3 oz)   SpO2: 100%   95%    Exam:  General:  Pt is alert, not in acute distress  HEENT: No icterus, No thrush, oral mucosa moist  Cardiovascular: regular rate and rhythm, S1/S2 No murmur  Respiratory:Poor air entry and she is unable to take a deep breath, clear to auscultation bilaterally  Abdomen: Soft, +Bowel sounds, non tender, non distended, no guarding  MSK: No LE edema, cyanosis or clubbing  Data Reviewed: Basic Metabolic Panel:  Recent Labs Lab 11/30/14 1945 12/01/14 1710 12/02/14 0610  NA 134* 132* 135  K 4.0 3.9 4.0  CL 94* 95* 102  CO2 29 29 26   GLUCOSE 148* 110* 103*  BUN 10 11 8   CREATININE 0.76 0.57 0.49  CALCIUM 9.0 8.2* 8.3*   Liver Function Tests:  Recent Labs Lab 11/30/14 1945 12/01/14 1710  AST 29 33  ALT 28 30  ALKPHOS 85 76  BILITOT 0.5 0.5  PROT 7.2 6.3*  ALBUMIN 3.0* 2.5*    Recent Labs Lab 12/01/14 1710  LIPASE 12*   No results for input(s): AMMONIA in the last 168  hours. CBC:  Recent Labs Lab 11/30/14 1945 12/01/14 1710  WBC 20.0* 20.1*  NEUTROABS 15.9* 16.2*  HGB 11.3* 9.5*  HCT 35.2* 29.8*  MCV 82.6 82.3  PLT 342 270   Cardiac Enzymes:  Recent Labs Lab 12/01/14 1710  TROPONINI <0.03   BNP (last 3 results) No results for input(s): BNP in the last 8760 hours.  ProBNP (last 3 results) No results for input(s): PROBNP in the last 8760 hours.  CBG: No results for input(s): GLUCAP in the last 168 hours.  Recent Results (from the past 240 hour(s))  Urine culture     Status: None   Collection Time: 11/23/14  7:30 PM  Result Value Ref Range Status   Specimen Description URINE, CLEAN CATCH  Final   Special Requests NONE  Final   Culture   Final    >=100,000 COLONIES/mL KLEBSIELLA PNEUMONIAE Performed at [facility]    Report Status 11/26/2014 FINAL  Final   Organism ID, Bacteria KLEBSIELLA PNEUMONIAE  Final      Susceptibility   Klebsiella pneumoniae - MIC*    AMPICILLIN 16 RESISTANT Resistant     CEFAZOLIN <=4 SENSITIVE Sensitive     CEFTRIAXONE <=1 SENSITIVE Sensitive     CIPROFLOXACIN <=0.25 SENSITIVE Sensitive     GENTAMICIN <=1 SENSITIVE Sensitive     IMIPENEM <=0.25 SENSITIVE Sensitive     NITROFURANTOIN 32 SENSITIVE Sensitive     TRIMETH/SULFA <=20 SENSITIVE Sensitive     AMPICILLIN/SULBACTAM 4 SENSITIVE Sensitive     PIP/TAZO <=4 SENSITIVE Sensitive     * >=100,000 COLONIES/mL KLEBSIELLA PNEUMONIAE  Urine culture     Status: None   Collection Time: 11/24/14  7:45 AM  Result Value Ref Range Status   Specimen Description   Final    URINE, RANDOM Performed at [facility]    Special Requests   Final    NONE Performed at [facility]    Culture   Final    NO GROWTH 1 DAY Performed at [facility]    Report Status 11/26/2014 FINAL  Final  Urine culture     Status: None (Preliminary result)   Collection Time: 12/01/14  4:06 PM  Result Value Ref Range Status    Specimen Description URINE, RANDOM  Final   Special Requests NONE  Final   Culture   Final    CULTURE REINCUBATED FOR BETTER GROWTH Performed at [facility]    Report Status PENDING  Incomplete  Blood Culture (routine x 2)     Status: None (Preliminary result)   Collection Time: 12/01/14  5:02 PM  Result Value Ref Range Status   Specimen Description BLOOD LEFT HAND  Final  Special Requests BOTTLES DRAWN AEROBIC AND ANAEROBIC 5CC  Final   Culture   Final    NO GROWTH < 24 HOURS Performed at [facility]    Report Status PENDING  Incomplete  Blood Culture (routine x 2)     Status: None (Preliminary result)   Collection Time: 12/01/14  5:11 PM  Result Value Ref Range Status   Specimen Description BLOOD RIGHT ANTECUBITAL  Final   Special Requests BOTTLES DRAWN AEROBIC AND ANAEROBIC 5CC  Final   Culture   Final    NO GROWTH < 24 HOURS Performed at [facility]    Report Status PENDING  Incomplete  Culture, sputum-assessment     Status: None   Collection Time: 12/02/14  9:00 AM  Result Value Ref Range Status   Specimen Description SPUTUM  Final   Special Requests NONE  Final   Sputum evaluation   Final    THIS SPECIMEN IS ACCEPTABLE. RESPIRATORY CULTURE REPORT TO FOLLOW.   Report Status 12/02/2014 FINAL  Final     Studies: Dg Chest 2 View  12/01/2014   CLINICAL DATA:  Chest pain, bilateral flank pain  EXAM: CHEST  2 VIEW  COMPARISON:  None.  FINDINGS: Cardiac size is unremarkable. No pulmonary edema. There is infiltrate or infiltrative process in the left upper lobe suprahilar region medially. Further correlation with enhanced CT scan of the chest is recommended.  IMPRESSION: Infiltrate or infiltrative process in left upper lobe suprahilar region. Further evaluation with enhanced CT scan of the chest is recommended.   Electronically Signed   By: Natasha Mead M.D.   On: 12/01/2014 17:32   Ct Chest W Contrast  12/01/2014   CLINICAL DATA:  Chest pain and  bilateral flank pain. Abnormal chest x-ray.  EXAM: CT CHEST, ABDOMEN, AND PELVIS WITH CONTRAST  TECHNIQUE: Multidetector CT imaging of the chest, abdomen and pelvis was performed following the standard protocol during bolus administration of intravenous contrast.  CONTRAST:  25mL OMNIPAQUE IOHEXOL 300 MG/ML SOLN, OMNIPAQUE IOHEXOL 300 MG/ML SOLN  COMPARISON:  Chest x-ray, same date.  FINDINGS: CT CHEST FINDINGS  Chest wall: No breast masses, supraclavicular or axillary adenopathy. Asymmetric breast tissue noted on the left. Nodular thyroid goiter noted. The bony thorax is intact. No destructive bone lesions or spinal canal compromise.  Mediastinum: The heart is normal in size. No pericardial effusion. Scattered mediastinal lymph nodes. 6 mm lymph node located between the left brachiocephalic vein and the left carotid artery on image 12. 7 mm prevascular lymph node on image number 16. There are smaller scattered pretracheal and aortic O pulmonary window nodes.  The aorta is normal in caliber. No dissection. The esophagus is grossly normal.  Lungs/ pleura: Cavitary left upper lobe lung process is most likely pneumonia with a lung abscess. There is significant surrounding interstitial change in the left upper lobe. Somewhat patchy rounded airspace opacity in the left upper lobe on image 21 is also likely pneumonia and less likely an nodule. The remainder of the lungs are clear. No pulmonary nodules or pleural effusion. There are underlying emphysematous changes.  CT ABDOMEN AND PELVIS FINDINGS  Hepatobiliary: No focal hepatic lesions or intrahepatic biliary dilatation. The gallbladder demonstrates a calcified gallstone. No findings for acute cholecystitis. No common bile duct dilatation.  Pancreas: No mass, inflammation or ductal dilatation.  Spleen: Normal size.  No focal lesions.  Adrenals/Urinary Tract: The adrenal glands and kidneys are unremarkable. There are scarring changes involving the upper pole region  of the left kidney.  Stomach/Bowel: The stomach, duodenum, small bowel and colon are unremarkable. No inflammatory changes, mass lesions or obstructive findings. The terminal ileum is normal. The appendix is normal.  Vascular/Lymphatic: No mesenteric or retroperitoneal mass or adenopathy. The aorta is normal in caliber. Moderate scattered atherosclerotic calcifications. The branch vessels are patent. The major venous structures are patent.  Other: The uterus and ovaries are unremarkable. A small calcified fibroid is noted. The bladder is normal. No pelvic mass, adenopathy or free pelvic fluid collections. No inguinal mass or adenopathy.  Musculoskeletal: No significant bony findings. A left hip prosthesis is noted.  IMPRESSION: Extensive necrotizing left upper lobe pneumonia with cavitations/lung abscesses. Adjacent inflammatory mediastinal lymph nodes.  Mild underlying emphysematous changes.  No acute abdominal/ pelvic findings, mass lesions or adenopathy.  Cholelithiasis.   Electronically Signed   By: Rudie Meyer M.D.   On: 12/01/2014 18:20   Ct Abdomen Pelvis W Contrast  12/01/2014   CLINICAL DATA:  Chest pain and bilateral flank pain. Abnormal chest x-ray.  EXAM: CT CHEST, ABDOMEN, AND PELVIS WITH CONTRAST  TECHNIQUE: Multidetector CT imaging of the chest, abdomen and pelvis was performed following the standard protocol during bolus administration of intravenous contrast.  CONTRAST:  25mL OMNIPAQUE IOHEXOL 300 MG/ML SOLN, OMNIPAQUE IOHEXOL 300 MG/ML SOLN  COMPARISON:  Chest x-ray, same date.  FINDINGS: CT CHEST FINDINGS  Chest wall: No breast masses, supraclavicular or axillary adenopathy. Asymmetric breast tissue noted on the left. Nodular thyroid goiter noted. The bony thorax is intact. No destructive bone lesions or spinal canal compromise.  Mediastinum: The heart is normal in size. No pericardial effusion. Scattered mediastinal lymph nodes. 6 mm lymph node located between the left brachiocephalic  vein and the left carotid artery on image 12. 7 mm prevascular lymph node on image number 16. There are smaller scattered pretracheal and aortic O pulmonary window nodes.  The aorta is normal in caliber. No dissection. The esophagus is grossly normal.  Lungs/ pleura: Cavitary left upper lobe lung process is most likely pneumonia with a lung abscess. There is significant surrounding interstitial change in the left upper lobe. Somewhat patchy rounded airspace opacity in the left upper lobe on image 21 is also likely pneumonia and less likely an nodule. The remainder of the lungs are clear. No pulmonary nodules or pleural effusion. There are underlying emphysematous changes.  CT ABDOMEN AND PELVIS FINDINGS  Hepatobiliary: No focal hepatic lesions or intrahepatic biliary dilatation. The gallbladder demonstrates a calcified gallstone. No findings for acute cholecystitis. No common bile duct dilatation.  Pancreas: No mass, inflammation or ductal dilatation.  Spleen: Normal size.  No focal lesions.  Adrenals/Urinary Tract: The adrenal glands and kidneys are unremarkable. There are scarring changes involving the upper pole region of the left kidney.  Stomach/Bowel: The stomach, duodenum, small bowel and colon are unremarkable. No inflammatory changes, mass lesions or obstructive findings. The terminal ileum is normal. The appendix is normal.  Vascular/Lymphatic: No mesenteric or retroperitoneal mass or adenopathy. The aorta is normal in caliber. Moderate scattered atherosclerotic calcifications. The branch vessels are patent. The major venous structures are patent.  Other: The uterus and ovaries are unremarkable. A small calcified fibroid is noted. The bladder is normal. No pelvic mass, adenopathy or free pelvic fluid collections. No inguinal mass or adenopathy.  Musculoskeletal: No significant bony findings. A left hip prosthesis is noted.  IMPRESSION: Extensive necrotizing left upper lobe pneumonia with cavitations/lung  abscesses. Adjacent inflammatory mediastinal lymph nodes.  Mild underlying  emphysematous changes.  No acute abdominal/ pelvic findings, mass lesions or adenopathy.  Cholelithiasis.   Electronically Signed   By: Rudie Meyer M.D.   On: 12/01/2014 18:20    Scheduled Meds:  Scheduled Meds: . aspirin EC  81 mg Oral Daily  . atorvastatin  20 mg Oral q1800  . cefTRIAXone (ROCEPHIN)  IV  2 g Intravenous Q24H  . enoxaparin (LOVENOX) injection  40 mg Subcutaneous QHS  . famotidine  10 mg Oral QHS  . gabapentin  600 mg Oral TID  . metronidazole  500 mg Intravenous Q8H  . nicotine  21 mg Transdermal Daily  . traZODone  50 mg Oral QHS   Continuous Infusions: . sodium chloride 125 mL/hr (12/02/14 0101)    Time spent on care of this patient: 35 minutes  min   RIZWAN,SAIMA, MD 12/02/2014, 2:43 PM  LOS: 1 day   [facility] Office  [phone] Pager - Text Page per [email or web address] If 7PM-7AM, please contact night-coverage [email or web address]

## 2014-12-03 LAB — BASIC METABOLIC PANEL
Anion gap: 10 (ref 5–15)
BUN: 5 mg/dL — AB (ref 6–20)
CALCIUM: 8.7 mg/dL — AB (ref 8.9–10.3)
CO2: 26 mmol/L (ref 22–32)
CREATININE: 0.56 mg/dL (ref 0.44–1.00)
Chloride: 100 mmol/L — ABNORMAL LOW (ref 101–111)
GFR calc Af Amer: >60 mL/min (ref >60)
Glucose, Bld: 101 mg/dL — ABNORMAL HIGH (ref 65–99)
POTASSIUM: 3.5 mmol/L (ref 3.5–5.1)
SODIUM: 136 mmol/L (ref 135–145)

## 2014-12-03 LAB — CBC
HCT: 31.7 % — ABNORMAL LOW (ref 36.0–46.0)
Hemoglobin: 9.9 g/dL — ABNORMAL LOW (ref 12.0–15.0)
MCH: 25.7 pg — ABNORMAL LOW (ref 26.0–34.0)
MCHC: 31.2 g/dL (ref 30.0–36.0)
MCV: 82.3 fL (ref 78.0–100.0)
PLATELETS: 285 10*3/uL (ref 150–400)
RBC: 3.85 MIL/uL — ABNORMAL LOW (ref 3.87–5.11)
RDW: 14.6 % (ref 11.5–15.5)
WBC: 15.5 10*3/uL — AB (ref 4.0–10.5)

## 2014-12-03 LAB — HIV ANTIBODY (ROUTINE TESTING W REFLEX): HIV Screen 4th Generation wRfx: NONREACTIVE

## 2014-12-03 MED ORDER — PANTOPRAZOLE SODIUM 40 MG PO TBEC
40.0000 mg | DELAYED_RELEASE_TABLET | Freq: Every day | ORAL | Status: DC
  Administered 2014-12-04 – 2014-12-07 (×4): 40 mg via ORAL
  Filled 2014-12-03 (×5): qty 1

## 2014-12-03 MED ORDER — DM-GUAIFENESIN ER 30-600 MG PO TB12
2.0000 | ORAL_TABLET | Freq: Two times a day (BID) | ORAL | Status: DC
  Administered 2014-12-03 – 2014-12-07 (×9): 2 via ORAL
  Filled 2014-12-03 (×10): qty 2

## 2014-12-03 MED ORDER — BOOST PLUS PO LIQD
237.0000 mL | Freq: Three times a day (TID) | ORAL | Status: DC
  Filled 2014-12-03 (×14): qty 237

## 2014-12-03 MED ORDER — ALBUTEROL SULFATE (2.5 MG/3ML) 0.083% IN NEBU
5.0000 mg | INHALATION_SOLUTION | Freq: Once | RESPIRATORY_TRACT | Status: AC
  Administered 2014-12-03: 5 mg via RESPIRATORY_TRACT
  Filled 2014-12-03: qty 6

## 2014-12-03 MED ORDER — FAMOTIDINE 20 MG PO TABS
20.0000 mg | ORAL_TABLET | Freq: Every day | ORAL | Status: DC
Start: 2014-12-03 — End: 2014-12-07
  Administered 2014-12-03 – 2014-12-06 (×4): 20 mg via ORAL
  Filled 2014-12-03 (×5): qty 1

## 2014-12-03 NOTE — Consult Note (Signed)
[facility] Consult   Reason for Consult:  Substance abuse, depression and status post suicidal attempt Referring Physician:  Dr. Wynelle Cleveland Patient Identification: Summer Estrada MRN:  [id number] Principal Diagnosis: Cocaine dependence with cocaine-induced mood disorder Diagnosis:   Patient Active Problem List   Diagnosis Date Noted  . Sepsis [A41.9] 12/02/2014  . Cholelithiasis [K80.20] 12/02/2014  . Pneumonia with lung abscess [J85.1] 12/01/2014  . Cocaine dependence with cocaine-induced mood disorder [F14.24] 11/27/2014  . GAD (generalized anxiety disorder) [F41.1] 11/27/2014  . Depression, major, recurrent, moderate [F33.1] 11/27/2014  . Cocaine abuse, continuous use [F14.10] 11/24/2014  . Low back pain [M54.5] 04/21/2013  . Hyperlipidemia [E78.5]   . GERD (gastroesophageal reflux disease) [K21.9]   . Arthritis [M19.90]   . Knee pain [M25.569]   . Wears dentures [Z98.811]   . Chronic pain [G89.29]   . Wears glasses [Z97.3]   . Trochanteric bursitis of right hip [M70.61] 06/22/2012  . Multinodular goiter [E04.2] 05/19/2012  . Medial meniscus tear [S83.249A] 05/07/2012  . Smoker [Z72.0] 04/14/2012  . Eczema [L30.9] 04/14/2012  . Hyperthyroidism [E05.90] 04/14/2012  . Hypotension [I95.9] 04/14/2012  . Cervical spinal stenosis [M48.02] 03/18/2012  . Insomnia [G47.00] 02/20/2012  . Osteoarthritis of both knees [M17.0] 08/08/2011  . Anxiety and depression [F41.8] 08/08/2011  . Cervical spondylosis [M47.812] 06/23/2011  . Patellar tendonitis [M76.50] 06/23/2011  . Myalgia and myositis, unspecified [M79.1, M60.9] 06/23/2011  . Calcifying tendinitis of shoulder [M75.30] 06/23/2011    Total Time spent with patient: 1 hour  Subjective:   Summer Estrada is a 54 y.o. female patient admitted with substance induced mood disorder.  HPI:  Summer Estrada is a 54 y.o. female seen face-to-face psychiatric consultation and evaluation of status post suicidal  attempt and also cocaine introduced mood disorder and was recently admitted to [facility]. Patient reported she has received treatment for her substance abuse and started feeling much better and stable. Before she was discharged to the outpatient care/rehabilitation treatment she becomes physically sick and then found to have pneumonia which required her to be transferred to the [facility]. Patient stated that before admitted to psychiatric hospital she took overdose of medication to kill herself and then next day woke up and stated that guarded given her new birth and she wanted to get help so she came to the hospital. Patient denies current symptoms of depression, anxiety, psychosis, craving for drugs, suicidal/homicidal ideation, intention or plans. Patient contract for safety while in the hospital.  As for the chart: The patient has been smoking crack again for the last few months. One week ago, she was evicted from her trailer, and she subsequently committed suicide with sleeping pills, but just passed out all night and woke up the next day. She subsequently presented for help and was admitted to [facility] for suicide. Although she thinks she may have had intermittent fever for months, over the past week, she has had fever, flank pain, shoulder pain, and progressive cough, pleuritic pain and shortness of breath until today she was transferred from [facility] over to [facility] ER.   HPI Elements:   Location:  Substance-induced mood disorder. Quality:  Fair. Severity:  Status post overdose sleep with sleeping pills but a week ago. Timing:  Currently admitted to medical floor with a pneumonia. Duration:  Few weeks. Context:  Psychosocial stressors, lives alone and disabled.  Past Medical History:  Past Medical History  Diagnosis Date  . Knee pain   .  Chronic pain   . Wears glasses   . Wears dentures     upper  . GERD (gastroesophageal reflux disease)     occ  .  Depression   . Arthritis   . Hyperlipidemia   . Anxiety     Past Surgical History  Procedure Laterality Date  . Total hip arthroplasty  2010    lt hip  . Cesarean section      x2  . Tubal ligation     Family History:  Family History  Problem Relation Age of Onset  . Kidney disease Mother   . Heart disease Father    Social History:  History  Alcohol Use No     History  Drug Use  . Yes  . Special: Cocaine    Social History   Social History  . Marital Status: Widowed    Spouse Name: N/A  . Number of Children: N/A  . Years of Education: N/A   Social History Main Topics  . Smoking status: Current Every Day Smoker -- 1.50 packs/day  . Smokeless tobacco: Never Used  . Alcohol Use: No  . Drug Use: Yes    Special: Cocaine  . Sexual Activity: Yes    Birth Control/ Protection: Post-menopausal     Comment: none   Other Topics Concern  . None   Social History Narrative   WIDOW   CHILDREN; ADULT GIRLS; 3   DISABLED   Additional Social History:                          Allergies:   Allergies  Allergen Reactions  . Codeine Nausea And Vomiting and Rash    Rash all over body; severe nausea and vomiting.    Labs:  Results for orders placed or performed during the hospital encounter of 12/01/14 (from the past 48 hour(s))  Strep pneumoniae urinary antigen     Status: None   Collection Time: 12/02/14  2:28 AM  Result Value Ref Range   Strep Pneumo Urinary Antigen NEGATIVE NEGATIVE    Comment:        Infection due to S. pneumoniae cannot be absolutely ruled out since the antigen present may be below the detection limit of the test.   HIV antibody     Status: None   Collection Time: 12/02/14  6:10 AM  Result Value Ref Range   HIV Screen 4th Generation wRfx Non Reactive Non Reactive    Comment: (NOTE) Performed At: [facility] [place], [place] [id number] Lindon Romp MD ER:[id number]   Basic metabolic panel     Status:  Abnormal   Collection Time: 12/02/14  6:10 AM  Result Value Ref Range   Sodium 135 135 - 145 mmol/L   Potassium 4.0 3.5 - 5.1 mmol/L   Chloride 102 101 - 111 mmol/L   CO2 26 22 - 32 mmol/L   Glucose, Bld 103 (H) 65 - 99 mg/dL   BUN 8 6 - 20 mg/dL   Creatinine, Ser 0.49 0.44 - 1.00 mg/dL   Calcium 8.3 (L) 8.9 - 10.3 mg/dL   GFR calc non Af Amer >60 >60 mL/min   GFR calc Af Amer >60 >60 mL/min    Comment: (NOTE) The eGFR has been calculated using the CKD EPI equation. This calculation has not been validated in all clinical situations. eGFR's persistently <60 mL/min signify possible Chronic Kidney Disease.    Anion gap 7 5 - 15  Culture, sputum-assessment     Status: None   Collection Time: 12/02/14  9:00 AM  Result Value Ref Range   Specimen Description SPUTUM    Special Requests NONE    Sputum evaluation      THIS SPECIMEN IS ACCEPTABLE. RESPIRATORY CULTURE REPORT TO FOLLOW.   Report Status 12/02/2014 FINAL   Culture, respiratory (NON-Expectorated)     Status: None (Preliminary result)   Collection Time: 12/02/14  9:00 AM  Result Value Ref Range   Specimen Description SPUTUM    Special Requests NONE    Gram Stain      ABUNDANT WBC PRESENT, PREDOMINANTLY PMN NO SQUAMOUS EPITHELIAL CELLS SEEN RARE GRAM POSITIVE COCCI IN CLUSTERS Performed at [facility]    Culture      NORMAL OROPHARYNGEAL FLORA Performed at [facility]    Report Status PENDING   Basic metabolic panel     Status: Abnormal   Collection Time: 12/03/14  5:48 AM  Result Value Ref Range   Sodium 136 135 - 145 mmol/L   Potassium 3.5 3.5 - 5.1 mmol/L   Chloride 100 (L) 101 - 111 mmol/L   CO2 26 22 - 32 mmol/L   Glucose, Bld 101 (H) 65 - 99 mg/dL   BUN 5 (L) 6 - 20 mg/dL   Creatinine, Ser 0.56 0.44 - 1.00 mg/dL   Calcium 8.7 (L) 8.9 - 10.3 mg/dL   GFR calc non Af Amer >60 >60 mL/min   GFR calc Af Amer >60 >60 mL/min    Comment: (NOTE) The eGFR has been calculated using the CKD EPI  equation. This calculation has not been validated in all clinical situations. eGFR's persistently <60 mL/min signify possible Chronic Kidney Disease.    Anion gap 10 5 - 15  CBC     Status: Abnormal   Collection Time: 12/03/14  5:48 AM  Result Value Ref Range   WBC 15.5 (H) 4.0 - 10.5 K/uL   RBC 3.85 (L) 3.87 - 5.11 MIL/uL   Hemoglobin 9.9 (L) 12.0 - 15.0 g/dL   HCT 31.7 (L) 36.0 - 46.0 %   MCV 82.3 78.0 - 100.0 fL   MCH 25.7 (L) 26.0 - 34.0 pg   MCHC 31.2 30.0 - 36.0 g/dL   RDW 14.6 11.5 - 15.5 %   Platelets 285 150 - 400 K/uL    Vitals: Blood pressure 109/61, pulse 78, temperature 98.7 F (37.1 C), temperature source Oral, resp. rate 20, height _0  (1.6 m), weight 74 kg (163 lb 2.3 oz), SpO2 97 %.  Risk to Self: Is patient at risk for suicide?: Yes (pt has sitter by bed and on suicide precautions) Risk to Others:   Prior Inpatient Therapy:   Prior Outpatient Therapy:    Current Facility-Administered Medications  Medication Dose Route Frequency Provider Last Rate Last Dose  . 0.9 %  sodium chloride infusion   Intravenous Continuous Edwin Dada, MD 125 mL/hr at 12/03/14 1154 125 mL/hr at 12/03/14 1154  . aspirin EC tablet 81 mg  81 mg Oral Daily Edwin Dada, MD   81 mg at 12/03/14 1053  . atorvastatin (LIPITOR) tablet 20 mg  20 mg Oral q1800 Edwin Dada, MD   20 mg at 12/03/14 1704  . carisoprodol (SOMA) tablet 350 mg  350 mg Oral Q8H PRN Edwin Dada, MD   350 mg at 12/03/14 0629  . cefTRIAXone (ROCEPHIN) 2 g in dextrose 5 % 50 mL IVPB  2 g Intravenous  Q24H Edwin Dada, MD   2 g at 12/03/14 1700  . dextromethorphan-guaiFENesin (MUCINEX DM) 30-600 MG per 12 hr tablet 2 tablet  2 tablet Oral BID Tanda Rockers, MD   2 tablet at 12/03/14 1053  . famotidine (PEPCID) tablet 20 mg  20 mg Oral QHS Tanda Rockers, MD      . gabapentin (NEURONTIN) capsule 600 mg  600 mg Oral TID Edwin Dada, MD   600 mg at 12/03/14 1700  .  lactose free nutrition (BOOST PLUS) liquid 237 mL  237 mL Oral TID WC Debbe Odea, MD   237 mL at 12/03/14 1200  . metroNIDAZOLE (FLAGYL) IVPB 500 mg  500 mg Intravenous Q8H Saima Rizwan, MD   500 mg at 12/03/14 1216  . nicotine (NICODERM CQ - dosed in mg/24 hours) patch 21 mg  21 mg Transdermal Daily Edwin Dada, MD   21 mg at 12/03/14 1054  . oxyCODONE (Oxy IR/ROXICODONE) immediate release tablet 5 mg  5 mg Oral Q4H PRN Edwin Dada, MD   5 mg at 12/03/14 1456  . [START ON 12/04/2014] pantoprazole (PROTONIX) EC tablet 40 mg  40 mg Oral QAC breakfast Tanda Rockers, MD      . traZODone (DESYREL) tablet 50 mg  50 mg Oral QHS Edwin Dada, MD   50 mg at 12/02/14 2108    Musculoskeletal: Strength & Muscle Tone: within normal limits Gait & Station: unable to stand Patient leans: N/A  Psychiatric Specialty Exam: Physical Exam as per history and physical   ROS shortness of breath, generalized chest pain, generalized weakness No Fever-chills, No Headache, No changes with Vision or hearing, reports vertigo No problems swallowing food or Liquids, No Chest pain, Cough or Shortness of Breath, No Abdominal pain, No Nausea or Vommitting, Bowel movements are regular, No Blood in stool or Urine, No dysuria, No new skin rashes or bruises, No new joints pains-aches,  No new weakness, tingling, numbness in any extremity, No recent weight gain or loss, No polyuria, polydypsia or polyphagia,   A full 10 point Review of Systems was done, except as stated above, all other Review of Systems were negative.  Blood pressure 109/61, pulse 78, temperature 98.7 F (37.1 C), temperature source Oral, resp. rate 20, height _0  (1.6 m), weight 74 kg (163 lb 2.3 oz), SpO2 97 %.Body mass index is 28.91 kg/(m^2).  General Appearance: Casual  Eye Contact::  Good  Speech:  Clear and Coherent and Slow  Volume:  Decreased  Mood:  Anxious and Depressed  Affect:  Appropriate and Congruent   Thought Process:  Coherent and Goal Directed  Orientation:  Full (Time, Place, and Person)  Thought Content:  WDL  Suicidal Thoughts:  No  Homicidal Thoughts:  No  Memory:  Immediate;   Good Recent;   Good  Judgement:  Fair  Insight:  Fair  Psychomotor Activity:  Decreased  Concentration:  Good  Recall:  Good  Fund of Knowledge:Good  Language: Good  Akathisia:  Negative  Handed:  Right  AIMS (if indicated):     Assets:  Communication Skills Desire for Improvement Financial Resources/Insurance Housing Leisure Time Resilience Transportation  ADL's:  Impaired  Cognition: WNL  Sleep:      Medical Decision Making: New problem, with additional work up planned, Review of Psycho-Social Stressors (1), Review or order clinical lab tests (1), Established Problem, Worsening (2), Review of Last Therapy Session (1), Review or order medicine tests (1), Review  of Medication Regimen & Side Effects (2) and Review of New Medication or Change in Dosage (2)  Treatment Plan Summary: Patient admitted to [facility] from [facility]. Patient was admitted to behavioral [facility] as a status post suicidal attempt and also substance abuse induced mood disorder. Daily contact with patient to assess and evaluate symptoms and progress in treatment and Medication management  Plan: Safety concerns: [place] Continue Neurontin 600 mg 3 times daily for depression and anxiety and trazodone 50 mg at bedtime for insomnia Recommend psychiatric Inpatient admission when medically cleared. Supportive therapy provided about ongoing stressors. Appreciate psychiatric consultation and follow up as clinically required Please contact [phone] or [phone] if needs further assistance  Disposition: Patient will be referred to the behavioral [facility] when she is medically stable. Psychiatric social service will contact behavioral [facility] for bed  availability.  JONNALAGADDA,JANARDHAHA R. 12/03/2014 5:29 PM

## 2014-12-03 NOTE — Progress Notes (Signed)
[facility] Progress Note   Summer Estrada  ERX:[id number]  DOB: Jul 07, 1960  DOA: 12/01/2014 PCP: Rudi Heap, MD  Brief narrative: Summer Estrada is a 54 y.o. female with a history of depression, gastroesophageal reflux disease, chronic pain and a suicide attempt one week ago with sleeping pills who was admitted to the behavioral medicine Hospital. She states that she's had a cough ever since she was admitted to behavioral medicine along with chest pain. She is coughing up bloody mucus. CT scan reveals pneumonia with a cavitating abscess.   Subjective: No significant change in symptoms-Continues to have cough with bloody mucus-still having pain across her upper back and having trouble taking a deep breath. Able to ambulate to the bathroom without significant dyspnea.  Assessment/Plan: Principal Problem:   Pneumonia with lung abscess- sepsis-fever 102.2, leukocytosis -Necrotizing left upper lobe pneumonia with cavitation/abscess - Possibly aspirated after she took the sleeping pills in a suicide attempt-  ---Continue Rocephin and Flagyl- sputum culture ordered -Pulmonary consult requested- recommends checking sputum for AFB, QuantGold, Legionella In order -Strep pneumonia negative -Obtain home O2 screen tomorrow- have ordered this  Active Problems:   GERD (gastroesophageal reflux disease) -Continue H2 blocker    Cocaine dependence with cocaine-induced mood disorder   GAD (generalized anxiety disorder)   Depression, major, recurrent, moderate   Recent suicide attempt - Requested psychiatry consult on 9/17 to continue to manage her while she is admitted  Chronic pain -Continue Neurontin & soma  Hyperlipidemia - Continue Lipitor-  Smoker -Nicotine patch     Code Status:     Code Status Orders        Start     Ordered   12/01/14 2221  Full code   Continuous     12/01/14 2223     Family Communication: Disposition Plan: Return to behavioral  medicine when stable?  DVT prophylaxis: Lovenox stopped by pulmonary today-place SCDs Consultants:Pulmonary critical care  Procedures:  Antibiotics: Anti-infectives    Start     Dose/Rate Route Frequency Ordered Stop   12/02/14 1700  cefTRIAXone (ROCEPHIN) 2 g in dextrose 5 % 50 mL IVPB     2 g 100 mL/hr over 30 Minutes Intravenous Every 24 hours 12/01/14 2223     12/02/14 1200  metroNIDAZOLE (FLAGYL) IVPB 500 mg     500 mg 100 mL/hr over 60 Minutes Intravenous Every 8 hours 12/02/14 0756     12/01/14 2230  clindamycin (CLEOCIN) IVPB 600 mg  Status:  Discontinued     600 mg 100 mL/hr over 30 Minutes Intravenous 3 times per day 12/01/14 2223 12/02/14 0756      Objective: Filed Weights   12/02/14 0500  Weight: 74 kg (163 lb 2.3 oz)    Intake/Output Summary (Last 24 hours) at 12/03/14 1403 Last data filed at 12/03/14 1300  Gross per 24 hour  Intake   3590 ml  Output   1250 ml  Net   2340 ml     Vitals Filed Vitals:   12/02/14 1506 12/02/14 2125 12/03/14 0516 12/03/14 1329  BP: 96/61 110/77 111/56 109/61  Pulse: 74 80 85 78  Temp: 97.6 F (36.4 C) 98.1 F (36.7 C) 100 F (37.8 C) 98.7 F (37.1 C)  TempSrc: Oral Oral Oral Oral  Resp: 20 16 16 20   Height:      Weight:      SpO2: 100% 100% 95% 97%    Exam:  General:  Pt is alert, not in acute distress  HEENT:  No icterus, No thrush, oral mucosa moist  Cardiovascular: regular rate and rhythm, S1/S2 No murmur  Respiratory:Poor air entry and she is unable to take a deep breath, mild bilateral wheeze today  Abdomen: Soft, +Bowel sounds, non tender, non distended, no guarding  MSK: No LE edema, cyanosis or clubbing  Data Reviewed: Basic Metabolic Panel:  Recent Labs Lab 11/30/14 1945 12/01/14 1710 12/02/14 0610 12/03/14 0548  NA 134* 132* 135 136  K 4.0 3.9 4.0 3.5  CL 94* 95* 102 100*  CO2 GLUCOSE 148* 110* 103* 101*  BUN 5*  CREATININE 0.76 0.57 0.49 0.56  CALCIUM 9.0 8.2*  8.3* 8.7*   Liver Function Tests:  Recent Labs Lab 11/30/14 1945 12/01/14 1710  AST 29 33  ALT 28 30  ALKPHOS 85 76  BILITOT 0.5 0.5  PROT 7.2 6.3*  ALBUMIN 3.0* 2.5*    Recent Labs Lab 12/01/14 1710  LIPASE 12*   No results for input(s): AMMONIA in the last 168 hours. CBC:  Recent Labs Lab 11/30/14 1945 12/01/14 1710 12/03/14 0548  WBC 20.0* 20.1* 15.5*  NEUTROABS 15.9* 16.2*  --   HGB 11.3* 9.5* 9.9*  HCT 35.2* 29.8* 31.7*  MCV 82.6 82.3 82.3  PLT 342 270 285   Cardiac Enzymes:  Recent Labs Lab 12/01/14 1710  TROPONINI <0.03   BNP (last 3 results) No results for input(s): BNP in the last 8760 hours.  ProBNP (last 3 results) No results for input(s): PROBNP in the last 8760 hours.  CBG: No results for input(s): GLUCAP in the last 168 hours.  Recent Results (from the past 240 hour(s))  Urine culture     Status: None   Collection Time: 11/23/14  7:30 PM  Result Value Ref Range Status   Specimen Description URINE, CLEAN CATCH  Final   Special Requests NONE  Final   Culture   Final    >=100,000 COLONIES/mL KLEBSIELLA PNEUMONIAE Performed at [facility]    Report Status 11/26/2014 FINAL  Final   Organism ID, Bacteria KLEBSIELLA PNEUMONIAE  Final      Susceptibility   Klebsiella pneumoniae - MIC*    AMPICILLIN 16 RESISTANT Resistant     CEFAZOLIN <=4 SENSITIVE Sensitive     CEFTRIAXONE <=1 SENSITIVE Sensitive     CIPROFLOXACIN <=0.25 SENSITIVE Sensitive     GENTAMICIN <=1 SENSITIVE Sensitive     IMIPENEM <=0.25 SENSITIVE Sensitive     NITROFURANTOIN 32 SENSITIVE Sensitive     TRIMETH/SULFA <=20 SENSITIVE Sensitive     AMPICILLIN/SULBACTAM 4 SENSITIVE Sensitive     PIP/TAZO <=4 SENSITIVE Sensitive     * >=100,000 COLONIES/mL KLEBSIELLA PNEUMONIAE  Urine culture     Status: None   Collection Time: 11/24/14  7:45 AM  Result Value Ref Range Status   Specimen Description   Final    URINE, RANDOM Performed at [facility]    Special Requests   Final    NONE Performed at [facility]    Culture   Final    NO GROWTH 1 DAY Performed at [facility]    Report Status 11/26/2014 FINAL  Final  Urine culture     Status: None (Preliminary result)   Collection Time: 12/01/14  4:06 PM  Result Value Ref Range Status   Specimen Description URINE, RANDOM  Final   Special Requests NONE  Final   Culture   Final    80,000 COLONIES/ml  GRAM NEGATIVE RODS Performed at [facility]    Report Status PENDING  Incomplete  Blood Culture (routine x 2)     Status: None (Preliminary result)   Collection Time: 12/01/14  5:02 PM  Result Value Ref Range Status   Specimen Description BLOOD LEFT HAND  Final   Special Requests BOTTLES DRAWN AEROBIC AND ANAEROBIC 5CC  Final   Culture   Final    NO GROWTH 2 DAYS Performed at [facility]    Report Status PENDING  Incomplete  Blood Culture (routine x 2)     Status: None (Preliminary result)   Collection Time: 12/01/14  5:11 PM  Result Value Ref Range Status   Specimen Description BLOOD RIGHT ANTECUBITAL  Final   Special Requests BOTTLES DRAWN AEROBIC AND ANAEROBIC 5CC  Final   Culture   Final    NO GROWTH 2 DAYS Performed at [facility]    Report Status PENDING  Incomplete  Culture, sputum-assessment     Status: None   Collection Time: 12/02/14  9:00 AM  Result Value Ref Range Status   Specimen Description SPUTUM  Final   Special Requests NONE  Final   Sputum evaluation   Final    THIS SPECIMEN IS ACCEPTABLE. RESPIRATORY CULTURE REPORT TO FOLLOW.   Report Status 12/02/2014 FINAL  Final  Culture, respiratory (NON-Expectorated)     Status: None (Preliminary result)   Collection Time: 12/02/14  9:00 AM  Result Value Ref Range Status   Specimen Description SPUTUM  Final   Special Requests NONE  Final   Gram Stain PENDING  Incomplete   Culture   Final    NORMAL OROPHARYNGEAL FLORA Performed at [facility]    Report Status PENDING  Incomplete     Studies: Dg Chest 2 View  12/01/2014   CLINICAL DATA:  Chest pain, bilateral flank pain  EXAM: CHEST  2 VIEW  COMPARISON:  None.  FINDINGS: Cardiac size is unremarkable. No pulmonary edema. There is infiltrate or infiltrative process in the left upper lobe suprahilar region medially. Further correlation with enhanced CT scan of the chest is recommended.  IMPRESSION: Infiltrate or infiltrative process in left upper lobe suprahilar region. Further evaluation with enhanced CT scan of the chest is recommended.   Electronically Signed   By: Natasha Mead M.D.   On: 12/01/2014 17:32   Ct Chest W Contrast  12/01/2014   CLINICAL DATA:  Chest pain and bilateral flank pain. Abnormal chest x-ray.  EXAM: CT CHEST, ABDOMEN, AND PELVIS WITH CONTRAST  TECHNIQUE: Multidetector CT imaging of the chest, abdomen and pelvis was performed following the standard protocol during bolus administration of intravenous contrast.  CONTRAST:  25mL OMNIPAQUE IOHEXOL 300 MG/ML SOLN, OMNIPAQUE IOHEXOL 300 MG/ML SOLN  COMPARISON:  Chest x-ray, same date.  FINDINGS: CT CHEST FINDINGS  Chest wall: No breast masses, supraclavicular or axillary adenopathy. Asymmetric breast tissue noted on the left. Nodular thyroid goiter noted. The bony thorax is intact. No destructive bone lesions or spinal canal compromise.  Mediastinum: The heart is normal in size. No pericardial effusion. Scattered mediastinal lymph nodes. 6 mm lymph node located between the left brachiocephalic vein and the left carotid artery on image 12. 7 mm prevascular lymph node on image number 16. There are smaller scattered pretracheal and aortic O pulmonary window nodes.  The aorta is normal in caliber. No dissection. The esophagus is grossly normal.  Lungs/ pleura: Cavitary left upper lobe lung process is most  likely pneumonia with a lung abscess. There is significant surrounding interstitial change in the left upper lobe.  Somewhat patchy rounded airspace opacity in the left upper lobe on image 21 is also likely pneumonia and less likely an nodule. The remainder of the lungs are clear. No pulmonary nodules or pleural effusion. There are underlying emphysematous changes.  CT ABDOMEN AND PELVIS FINDINGS  Hepatobiliary: No focal hepatic lesions or intrahepatic biliary dilatation. The gallbladder demonstrates a calcified gallstone. No findings for acute cholecystitis. No common bile duct dilatation.  Pancreas: No mass, inflammation or ductal dilatation.  Spleen: Normal size.  No focal lesions.  Adrenals/Urinary Tract: The adrenal glands and kidneys are unremarkable. There are scarring changes involving the upper pole region of the left kidney.  Stomach/Bowel: The stomach, duodenum, small bowel and colon are unremarkable. No inflammatory changes, mass lesions or obstructive findings. The terminal ileum is normal. The appendix is normal.  Vascular/Lymphatic: No mesenteric or retroperitoneal mass or adenopathy. The aorta is normal in caliber. Moderate scattered atherosclerotic calcifications. The branch vessels are patent. The major venous structures are patent.  Other: The uterus and ovaries are unremarkable. A small calcified fibroid is noted. The bladder is normal. No pelvic mass, adenopathy or free pelvic fluid collections. No inguinal mass or adenopathy.  Musculoskeletal: No significant bony findings. A left hip prosthesis is noted.  IMPRESSION: Extensive necrotizing left upper lobe pneumonia with cavitations/lung abscesses. Adjacent inflammatory mediastinal lymph nodes.  Mild underlying emphysematous changes.  No acute abdominal/ pelvic findings, mass lesions or adenopathy.  Cholelithiasis.   Electronically Signed   By: Rudie Meyer M.D.   On: 12/01/2014 18:20   Ct Abdomen Pelvis W Contrast  12/01/2014   CLINICAL DATA:  Chest pain and bilateral flank pain. Abnormal chest x-ray.  EXAM: CT CHEST, ABDOMEN, AND PELVIS WITH CONTRAST   TECHNIQUE: Multidetector CT imaging of the chest, abdomen and pelvis was performed following the standard protocol during bolus administration of intravenous contrast.  CONTRAST:  25mL OMNIPAQUE IOHEXOL 300 MG/ML SOLN, OMNIPAQUE IOHEXOL 300 MG/ML SOLN  COMPARISON:  Chest x-ray, same date.  FINDINGS: CT CHEST FINDINGS  Chest wall: No breast masses, supraclavicular or axillary adenopathy. Asymmetric breast tissue noted on the left. Nodular thyroid goiter noted. The bony thorax is intact. No destructive bone lesions or spinal canal compromise.  Mediastinum: The heart is normal in size. No pericardial effusion. Scattered mediastinal lymph nodes. 6 mm lymph node located between the left brachiocephalic vein and the left carotid artery on image 12. 7 mm prevascular lymph node on image number 16. There are smaller scattered pretracheal and aortic O pulmonary window nodes.  The aorta is normal in caliber. No dissection. The esophagus is grossly normal.  Lungs/ pleura: Cavitary left upper lobe lung process is most likely pneumonia with a lung abscess. There is significant surrounding interstitial change in the left upper lobe. Somewhat patchy rounded airspace opacity in the left upper lobe on image 21 is also likely pneumonia and less likely an nodule. The remainder of the lungs are clear. No pulmonary nodules or pleural effusion. There are underlying emphysematous changes.  CT ABDOMEN AND PELVIS FINDINGS  Hepatobiliary: No focal hepatic lesions or intrahepatic biliary dilatation. The gallbladder demonstrates a calcified gallstone. No findings for acute cholecystitis. No common bile duct dilatation.  Pancreas: No mass, inflammation or ductal dilatation.  Spleen: Normal size.  No focal lesions.  Adrenals/Urinary Tract: The adrenal glands and kidneys are unremarkable. There are scarring changes involving the upper pole region of  the left kidney.  Stomach/Bowel: The stomach, duodenum, small bowel and colon are  unremarkable. No inflammatory changes, mass lesions or obstructive findings. The terminal ileum is normal. The appendix is normal.  Vascular/Lymphatic: No mesenteric or retroperitoneal mass or adenopathy. The aorta is normal in caliber. Moderate scattered atherosclerotic calcifications. The branch vessels are patent. The major venous structures are patent.  Other: The uterus and ovaries are unremarkable. A small calcified fibroid is noted. The bladder is normal. No pelvic mass, adenopathy or free pelvic fluid collections. No inguinal mass or adenopathy.  Musculoskeletal: No significant bony findings. A left hip prosthesis is noted.  IMPRESSION: Extensive necrotizing left upper lobe pneumonia with cavitations/lung abscesses. Adjacent inflammatory mediastinal lymph nodes.  Mild underlying emphysematous changes.  No acute abdominal/ pelvic findings, mass lesions or adenopathy.  Cholelithiasis.   Electronically Signed   By: Rudie Meyer M.D.   On: 12/01/2014 18:20    Scheduled Meds:  Scheduled Meds: . aspirin EC  81 mg Oral Daily  . atorvastatin  20 mg Oral q1800  . cefTRIAXone (ROCEPHIN)  IV  2 g Intravenous Q24H  . dextromethorphan-guaiFENesin  2 tablet Oral BID  . famotidine  20 mg Oral QHS  . gabapentin  600 mg Oral TID  . lactose free nutrition  237 mL Oral TID WC  . metronidazole  500 mg Intravenous Q8H  . nicotine  21 mg Transdermal Daily  . [START ON 12/04/2014] pantoprazole  40 mg Oral QAC breakfast  . traZODone  50 mg Oral QHS   Continuous Infusions: . sodium chloride 125 mL/hr (12/03/14 1154)    Time spent on care of this patient: 35 minutes    RIZWAN,SAIMA, MD 12/03/2014, 2:03 PM  LOS: 2 days   [facility] Office  [phone] Pager - Text Page per [email or web address] If 7PM-7AM, please contact night-coverage [email or web address]

## 2014-12-03 NOTE — Consult Note (Signed)
PULMONARY / CRITICAL CARE MEDICINE   Name: Summer Estrada MRN: [id number] DOB: 02-Jul-1960    ADMISSION DATE:  12/01/2014 CONSULTATION DATE:   12/03/14   REFERRING MD :  [facility]  CHIEF COMPLAINT:  Cough/fever  INITIAL PRESENTATION: 53 yowf active cig smoker and crack cocaine smoker admitted by [facility] 9/16 with fever and cough ? Duration p suicide attempt initially admitted to [facility] and found to have necrotizing process in LUL apical segment   STUDIES:  CT chest 9/16 : Extensive necrotizing left upper lobe pneumonia with cavitations/lung abscesses. Adjacent inflammatory mediastinal lymph nodes.  SIGNIFICANT EVENTS:      HISTORY OF PRESENT ILLNESS:    Summer Estrada is a 55 y.o. female with a past medical history significant for recent recurrent suicide attempt 1 week  Prior to admit  with sleeping pills who has been admitted to [facility] since then, as well as history of depression, anxiety, GERD and chronic pain who presents with progressive chest discomfort, cough, and fevers.  The patient has been smoking crack again for the last few months. One week prior to admit, she was evicted from her trailer, and she subsequently committed suicide with sleeping pills, but just passed out all night and woke up the next day. She subsequently presented for help and was admitted to [facility] for suicide. Although she thinks she may have had intermittent fever for months, over the past week, she has had fever, flank pain, shoulder pain, and progressive cough, pleuritic pain and shortness of breath until today she was transferred from [facility] over to [facility] ER.   rx with rocephin and flagyl since 9/16 > sputum less purulent traces of blood on lovenox   No obvious  Variability to symptoms or sob at res/   chest tightness, subjective wheeze or overt sinus or hb symptoms. No unusual exp hx or h/o childhood pna/ asthma or knowledge of premature birth.     Also denies any obvious  fluctuation of symptoms with weather or environmental changes or other aggravating or alleviating factors except as outlined above   Current Medications, Allergies, Complete Past Medical History, Past Surgical History, Family History, and Social History were reviewed in [employer] record.  ROS  The following are not active complaints unless bolded sore throat, dysphagia, dental problems, itching, sneezing,  nasal congestion or excess/ purulent secretions, ear ache,   fever, chills, sweats, unintended wt loss,   exertional cp, hemoptysis,  orthopnea pnd or leg swelling, presyncope, palpitations, abdominal pain, anorexia, nausea, vomiting, diarrhea  or change in bowel or bladder habits, change in stools or urine, dysuria,hematuria,  rash, arthralgias, visual complaints, headache, numbness, weakness or ataxia or problems with walking or coordination,  change in mood/affect or memory.        PAST MEDICAL HISTORY :   has a past medical history of Knee pain; Chronic pain; Wears glasses; Wears dentures; GERD (gastroesophageal reflux disease); Depression; Arthritis; Hyperlipidemia; and Anxiety.  has past surgical history that includes Total hip arthroplasty (2010); Cesarean section; and Tubal ligation. Prior to Admission medications   Medication Sig Start Date End Date Taking? Authorizing Provider  aspirin EC 81 MG tablet Take 81 mg by mouth daily.   Yes Historical Provider, MD  atorvastatin (LIPITOR) 20 MG tablet TAKE ONE TABLET BY MOUTH EVERY DAY 10/18/14  Yes Ernestina Penna, MD  buPROPion [facility]) 100 MG tablet Take 100 mg by mouth 3 (three) times daily. 10/18/14  Yes Historical Provider, MD  gabapentin (NEURONTIN)  300 MG capsule TAKE 2 CAPSULES BY MOUTH 3 TIMES DAILY 10/18/14  Yes Ernestina Penna, MD  meloxicam (MOBIC) 15 MG tablet TAKE 1 TABLET BY MOUTH EVERY DAY 11/22/13  Yes Junie Spencer, FNP  ranitidine (ZANTAC) 150 MG tablet Take 150 mg by mouth daily.   Yes Historical  Provider, MD  traZODone (DESYREL) 50 MG tablet TAKE 1 TABLET BY MOUTH EVERY NIGHT AT BEDTIME 09/11/14  Yes Mary-Margaret Daphine Deutscher, FNP  carisoprodol (SOMA) 350 MG tablet TAKE ONE TABLET BY MOUTH EVERY 8 HOURS AS NEEDED Patient not taking: Reported on 12/01/2014 10/11/14   Junie Spencer, FNP   Allergies  Allergen Reactions  . Codeine Nausea And Vomiting and Rash    Rash all over body; severe nausea and vomiting.    FAMILY HISTORY:  indicated that her mother is deceased. She indicated that her father is deceased.  SOCIAL HISTORY:  reports that she has been smoking.  She has never used smokeless tobacco. She reports that she uses illicit drugs (Cocaine). She reports that she does not drink alcohol.     SUBJECTIVE:  Angry appearing wf nad   VITAL SIGNS: Temp:  [97.6 F (36.4 C)-100 F (37.8 C)] 100 F (37.8 C) (09/18 0516) Pulse Rate:  [74-85] 85 (09/18 0516) Resp:  [16-20] 16 (09/18 0516) BP: (96-111)/(56-77) 111/56 mmHg (09/18 0516) SpO2:  [95 %-100 %] 95 % (09/18 0516)  FIO2  2 2lpm   HEMODYNAMICS:   VENTILATOR SETTINGS:   INTAKE / OUTPUT:  Intake/Output Summary (Last 24 hours) at 12/03/14 0848 Last data filed at 12/03/14 0600  Gross per 24 hour  Intake   3940 ml  Output    800 ml  Net   3140 ml    PHYSICAL EXAMINATION: General:  Animated wf clutching L chest when coughing  Pt alert, approp nad @ 45degreees No jvd Oropharynx clear - dentition poor Neck supple Lungs with a few scattered exp > insp rhonchi bilaterally RRR no s3 or or sign murmur Abd sof with nl   excursion >> Extr wam with no edema or clubbing noted Neuro  No motor deficits   LABS:  CBC  Recent Labs Lab 11/30/14 1945 12/01/14 1710 12/03/14 0548  WBC 20.0* 20.1* 15.5*  HGB 11.3* 9.5* 9.9*  HCT 35.2* 29.8* 31.7*  PLT 342 270 285   Coag's No results for input(s): APTT, INR in the last 168 hours. BMET  Recent Labs Lab 12/01/14 1710 12/02/14 0610 12/03/14 0548  NA 132* 135 136  K  3.9 4.0 3.5  CL 95* 102 100*  CO2 BUN 11 8 5*  CREATININE 0.57 0.49 0.56  GLUCOSE 110* 103* 101*   Electrolytes  Recent Labs Lab 12/01/14 1710 12/02/14 0610 12/03/14 0548  CALCIUM 8.2* 8.3* 8.7*   Sepsis Markers  Recent Labs Lab 12/01/14 1704  LATICACIDVEN 1.53   ABG No results for input(s): PHART, PCO2ART, PO2ART in the last 168 hours. Liver Enzymes  Recent Labs Lab 11/30/14 1945 12/01/14 1710  AST 29 33  ALT 28 30  ALKPHOS 85 76  BILITOT 0.5 0.5  ALBUMIN 3.0* 2.5*   Cardiac Enzymes  Recent Labs Lab 12/01/14 1710  TROPONINI <0.03   Glucose No results for input(s): GLUCAP in the last 168 hours.  Imaging No results found.   ASSESSMENT / PLAN:  Necrotizing pna LUL apically ? Onset in setting of suicide attempt/ transient LOC/crack cocaine inhalation - Rx Rocephin/Flagyl 9/16 >>>  The ddx for necrotizing pna  does include aspiration but unless she was prone the location so antieriorly and medially would be usual    -  ddx could include Ca and TB but pt vehemently denies any resp symptoms prior to admit to [facility]  Rec: Check sputum for afb and check quant GOLD to be complete but believe this is low prob so no isolation Since fever and wbc trending down ok with roc/flagyl for now and switch over to augmentin x 2 weeks then f/u ov if doing well, if not may need fob at some point though typically it may takes weeks to improve some necrosis with abscess formation has occurred.  Stop lovenox and just use pas in bed since beginning to cough up blood.  Will need leginonella antigen checked when available as well as legionella can cause necrosis as well MRSA is in ddx but should be able to tell from the culture submitted and would expect to be much sicker systemically but might change to clindamycin to cover both staph and anaerobes better if deteriorates at all.       Sandrea Hughs, MD Pulmonary and Critical Care Medicine [facility] Cell  [phone] After 5:30 PM or weekends, call [phone]

## 2014-12-03 NOTE — Progress Notes (Signed)
Pt refused her Lovenox.  She stated that "it hurt too much".

## 2014-12-03 NOTE — Progress Notes (Signed)
Pt having mutiple episodes of sputum that is pink and clear colored. Will continue to monitior.

## 2014-12-03 NOTE — Progress Notes (Signed)
Pt c/o not being able to take a deep breath, and she says it feels like she has to cough but is unable to.  When I asked her to take deep breaths so I could listen to her lungs, she was unable to do so. Notified provider on call, received order for albuter nebulizer treatments.

## 2014-12-04 LAB — CULTURE, RESPIRATORY: CULTURE: NORMAL

## 2014-12-04 LAB — URINE CULTURE: Culture: 80000

## 2014-12-04 LAB — EXPECTORATED SPUTUM ASSESSMENT W REFEX TO RESP CULTURE

## 2014-12-04 LAB — EXPECTORATED SPUTUM ASSESSMENT W GRAM STAIN, RFLX TO RESP C

## 2014-12-04 LAB — CULTURE, RESPIRATORY W GRAM STAIN

## 2014-12-04 LAB — LEGIONELLA ANTIGEN, URINE

## 2014-12-04 MED ORDER — IPRATROPIUM-ALBUTEROL 0.5-2.5 (3) MG/3ML IN SOLN
3.0000 mL | RESPIRATORY_TRACT | Status: DC | PRN
  Administered 2014-12-04 – 2014-12-05 (×2): 3 mL via RESPIRATORY_TRACT
  Filled 2014-12-04 (×2): qty 3

## 2014-12-04 MED ORDER — MORPHINE SULFATE (PF) 2 MG/ML IV SOLN: 1.0000 mg | INTRAVENOUS | Status: DC | PRN

## 2014-12-04 MED ORDER — LORAZEPAM 1 MG PO TABS
1.0000 mg | ORAL_TABLET | Freq: Four times a day (QID) | ORAL | Status: DC | PRN
  Administered 2014-12-04 – 2014-12-05 (×2): 1 mg via ORAL
  Filled 2014-12-04 (×2): qty 1

## 2014-12-04 MED ORDER — HYDROCODONE-ACETAMINOPHEN 5-325 MG PO TABS
1.0000 | ORAL_TABLET | ORAL | Status: DC | PRN
Start: 2014-12-04 — End: 2014-12-07
  Administered 2014-12-04: 2 via ORAL
  Administered 2014-12-04: 1 via ORAL
  Administered 2014-12-04: 2 via ORAL
  Administered 2014-12-05: 1 via ORAL
  Administered 2014-12-05 – 2014-12-07 (×7): 2 via ORAL
  Filled 2014-12-04 (×9): qty 2
  Filled 2014-12-04: qty 1
  Filled 2014-12-04: qty 2
  Filled 2014-12-04: qty 1

## 2014-12-04 MED ORDER — METRONIDAZOLE 500 MG PO TABS
500.0000 mg | ORAL_TABLET | Freq: Three times a day (TID) | ORAL | Status: DC
  Administered 2014-12-04 – 2014-12-05 (×4): 500 mg via ORAL
  Filled 2014-12-04 (×6): qty 1

## 2014-12-04 MED ORDER — GUAIFENESIN-DM 100-10 MG/5ML PO SYRP
5.0000 mL | ORAL_SOLUTION | ORAL | Status: DC | PRN
  Administered 2014-12-04 – 2014-12-06 (×2): 5 mL via ORAL
  Filled 2014-12-04 (×2): qty 10

## 2014-12-04 NOTE — Progress Notes (Signed)
Patient ID: Summer Estrada, female   DOB: 05-25-1960, 54 y.o.   MRN: [id number]  [facility] PROGRESS NOTE  Summer Estrada WUJ:[id number] DOB: 1961-02-09 DOA: 12/01/2014 PCP: Rudi Heap, MD   Brief narrative:    46 yowf active cig smoker and crack cocaine smoker admitted 9/16 with fever and cough ? Duration. Pt also with suicide attempt initially admitted to [facility] and found to have necrotizing process in LUL apical segment   Assessment/Plan:    Principal Problem:   Cocaine dependence with cocaine-induced mood disorder - discussed cessation with pt in detail - will need close outpatient monitoring   Active Problems:  Necrotizing PNA LUL apically  - ? Onset in setting of suicide attempt/ transient LOC/crack cocaine inhalation - continue Rocephin/Flagyl 9/16 >>>  Plan to change to Augmentin in AM - follow up on sputum AFB, quantiferon gold - place on respiratory isolation   Sepsis secondary to necrotizing LUL PNA, Klebsiella UTI - ABX as noted above, Rocephin is adequate for UTI - pt remains afebrile and WNL trending down - repeat CBC in AM  GERD (gastroesophageal reflux disease) - continue PPI  Depression, major, recurrent, moderate - appreciate psych following    DVT prophylaxis  Code Status: Full.  Family Communication:  plan of care discussed with the patient Disposition Plan: Home when stable.   IV access:  Peripheral IV  Procedures and diagnostic studies:    Dg Chest 2 View  12/01/2014   CLINICAL DATA:  Chest pain, bilateral flank pain  EXAM: CHEST  2 VIEW  COMPARISON:  None.  FINDINGS: Cardiac size is unremarkable. No pulmonary edema. There is infiltrate or infiltrative process in the left upper lobe suprahilar region medially. Further correlation with enhanced CT scan of the chest is recommended.  IMPRESSION: Infiltrate or infiltrative process in left upper lobe suprahilar region. Further evaluation with enhanced CT scan of the chest is recommended.    Electronically Signed   By: Natasha Mead M.D.   On: 12/01/2014 17:32   Ct Chest W Contrast  12/01/2014   CLINICAL DATA:  Chest pain and bilateral flank pain. Abnormal chest x-ray.  EXAM: CT CHEST, ABDOMEN, AND PELVIS WITH CONTRAST  TECHNIQUE: Multidetector CT imaging of the chest, abdomen and pelvis was performed following the standard protocol during bolus administration of intravenous contrast.  CONTRAST:  25mL OMNIPAQUE IOHEXOL 300 MG/ML SOLN, OMNIPAQUE IOHEXOL 300 MG/ML SOLN  COMPARISON:  Chest x-ray, same date.  FINDINGS: CT CHEST FINDINGS  Chest wall: No breast masses, supraclavicular or axillary adenopathy. Asymmetric breast tissue noted on the left. Nodular thyroid goiter noted. The bony thorax is intact. No destructive bone lesions or spinal canal compromise.  Mediastinum: The heart is normal in size. No pericardial effusion. Scattered mediastinal lymph nodes. 6 mm lymph node located between the left brachiocephalic vein and the left carotid artery on image 12. 7 mm prevascular lymph node on image number 16. There are smaller scattered pretracheal and aortic O pulmonary window nodes.  The aorta is normal in caliber. No dissection. The esophagus is grossly normal.  Lungs/ pleura: Cavitary left upper lobe lung process is most likely pneumonia with a lung abscess. There is significant surrounding interstitial change in the left upper lobe. Somewhat patchy rounded airspace opacity in the left upper lobe on image 21 is also likely pneumonia and less likely an nodule. The remainder of the lungs are clear. No pulmonary nodules or pleural effusion. There are underlying emphysematous changes.  CT ABDOMEN AND PELVIS FINDINGS  Hepatobiliary: No focal hepatic lesions or intrahepatic biliary dilatation. The gallbladder demonstrates a calcified gallstone. No findings for acute cholecystitis. No common bile duct dilatation.  Pancreas: No mass, inflammation or ductal dilatation.  Spleen: Normal size.  No focal  lesions.  Adrenals/Urinary Tract: The adrenal glands and kidneys are unremarkable. There are scarring changes involving the upper pole region of the left kidney.  Stomach/Bowel: The stomach, duodenum, small bowel and colon are unremarkable. No inflammatory changes, mass lesions or obstructive findings. The terminal ileum is normal. The appendix is normal.  Vascular/Lymphatic: No mesenteric or retroperitoneal mass or adenopathy. The aorta is normal in caliber. Moderate scattered atherosclerotic calcifications. The branch vessels are patent. The major venous structures are patent.  Other: The uterus and ovaries are unremarkable. A small calcified fibroid is noted. The bladder is normal. No pelvic mass, adenopathy or free pelvic fluid collections. No inguinal mass or adenopathy.  Musculoskeletal: No significant bony findings. A left hip prosthesis is noted.  IMPRESSION: Extensive necrotizing left upper lobe pneumonia with cavitations/lung abscesses. Adjacent inflammatory mediastinal lymph nodes.  Mild underlying emphysematous changes.  No acute abdominal/ pelvic findings, mass lesions or adenopathy.  Cholelithiasis.   Electronically Signed   By: Rudie Meyer M.D.   On: 12/01/2014 18:20   Ct Abdomen Pelvis W Contrast  12/01/2014   CLINICAL DATA:  Chest pain and bilateral flank pain. Abnormal chest x-ray.  EXAM: CT CHEST, ABDOMEN, AND PELVIS WITH CONTRAST  TECHNIQUE: Multidetector CT imaging of the chest, abdomen and pelvis was performed following the standard protocol during bolus administration of intravenous contrast.  CONTRAST:  25mL OMNIPAQUE IOHEXOL 300 MG/ML SOLN, OMNIPAQUE IOHEXOL 300 MG/ML SOLN  COMPARISON:  Chest x-ray, same date.  FINDINGS: CT CHEST FINDINGS  Chest wall: No breast masses, supraclavicular or axillary adenopathy. Asymmetric breast tissue noted on the left. Nodular thyroid goiter noted. The bony thorax is intact. No destructive bone lesions or spinal canal compromise.  Mediastinum: The  heart is normal in size. No pericardial effusion. Scattered mediastinal lymph nodes. 6 mm lymph node located between the left brachiocephalic vein and the left carotid artery on image 12. 7 mm prevascular lymph node on image number 16. There are smaller scattered pretracheal and aortic O pulmonary window nodes.  The aorta is normal in caliber. No dissection. The esophagus is grossly normal.  Lungs/ pleura: Cavitary left upper lobe lung process is most likely pneumonia with a lung abscess. There is significant surrounding interstitial change in the left upper lobe. Somewhat patchy rounded airspace opacity in the left upper lobe on image 21 is also likely pneumonia and less likely an nodule. The remainder of the lungs are clear. No pulmonary nodules or pleural effusion. There are underlying emphysematous changes.  CT ABDOMEN AND PELVIS FINDINGS  Hepatobiliary: No focal hepatic lesions or intrahepatic biliary dilatation. The gallbladder demonstrates a calcified gallstone. No findings for acute cholecystitis. No common bile duct dilatation.  Pancreas: No mass, inflammation or ductal dilatation.  Spleen: Normal size.  No focal lesions.  Adrenals/Urinary Tract: The adrenal glands and kidneys are unremarkable. There are scarring changes involving the upper pole region of the left kidney.  Stomach/Bowel: The stomach, duodenum, small bowel and colon are unremarkable. No inflammatory changes, mass lesions or obstructive findings. The terminal ileum is normal. The appendix is normal.  Vascular/Lymphatic: No mesenteric or retroperitoneal mass or adenopathy. The aorta is normal in caliber. Moderate scattered atherosclerotic calcifications. The branch vessels are patent. The major venous structures are patent.  Other: The  uterus and ovaries are unremarkable. A small calcified fibroid is noted. The bladder is normal. No pelvic mass, adenopathy or free pelvic fluid collections. No inguinal mass or adenopathy.  Musculoskeletal: No  significant bony findings. A left hip prosthesis is noted.  IMPRESSION: Extensive necrotizing left upper lobe pneumonia with cavitations/lung abscesses. Adjacent inflammatory mediastinal lymph nodes.  Mild underlying emphysematous changes.  No acute abdominal/ pelvic findings, mass lesions or adenopathy.  Cholelithiasis.   Electronically Signed   By: Rudie Meyer M.D.   On: 12/01/2014 18:20    Medical Consultants:    Other Consultants:    IAnti-Infectives:     Debbora Presto, MD  [facility] Pager [phone]  If 7PM-7AM, please contact night-coverage [email or web address] Password [email or web address] 12/04/2014, 2:03 PM   LOS: 3 days   HPI/Subjective: No events overnight.   Objective: Filed Vitals:   12/03/14 2230 12/03/14 2334 12/04/14 0605 12/04/14 0745  BP: 115/80  115/69   Pulse: 73  80   Temp: 98.1 F (36.7 C)  98.9 F (37.2 C)   TempSrc: Oral  Oral   Resp: 16  16   Height:      Weight:      SpO2: 100% 98% 99% 90%    Intake/Output Summary (Last 24 hours) at 12/04/14 1403 Last data filed at 12/04/14 0333  Gross per 24 hour  Intake   3070 ml  Output   1200 ml  Net   1870 ml    Exam:   General:  Pt is alert, follows commands appropriately, not in acute distress  Cardiovascular: Regular rate and rhythm, S1/S2, no murmurs, no rubs, no gallops  Respiratory: Clear to auscultation bilaterally, no wheezing, no crackles, no rhonchi  Abdomen: Soft, non tender, non distended, bowel sounds present, no guarding  Extremities: No edema, pulses DP and PT palpable bilaterally  Neuro: Grossly nonfocal  Data Reviewed: Basic Metabolic Panel:  Recent Labs Lab 11/30/14 1945 12/01/14 1710 12/02/14 0610 12/03/14 0548  NA 134* 132* 135 136  K 4.0 3.9 4.0 3.5  CL 94* 95* 102 100*  CO2 GLUCOSE 148* 110* 103* 101*  BUN 5*  CREATININE 0.76 0.57 0.49 0.56  CALCIUM 9.0 8.2* 8.3* 8.7*   Liver Function Tests:  Recent Labs Lab 11/30/14 1945 12/01/14 1710  AST 29 33   ALT 28 30  ALKPHOS 85 76  BILITOT 0.5 0.5  PROT 7.2 6.3*  ALBUMIN 3.0* 2.5*    Recent Labs Lab 12/01/14 1710  LIPASE 12*   No results for input(s): AMMONIA in the last 168 hours. CBC:  Recent Labs Lab 11/30/14 1945 12/01/14 1710 12/03/14 0548  WBC 20.0* 20.1* 15.5*  NEUTROABS 15.9* 16.2*  --   HGB 11.3* 9.5* 9.9*  HCT 35.2* 29.8* 31.7*  MCV 82.6 82.3 82.3  PLT 342 270 285   Cardiac Enzymes:  Recent Labs Lab 12/01/14 1710  TROPONINI <0.03   BNP: Invalid input(s): POCBNP CBG: No results for input(s): GLUCAP in the last 168 hours.  Recent Results (from the past 240 hour(s))  Urine culture     Status: None   Collection Time: 12/01/14  4:06 PM  Result Value Ref Range Status   Specimen Description URINE, RANDOM  Final   Special Requests NONE  Final   Culture   Final    80,000 COLONIES/ml KLEBSIELLA PNEUMONIAE Performed at [facility]    Report Status 12/04/2014 FINAL  Final   Organism ID, Bacteria KLEBSIELLA PNEUMONIAE  Final  Susceptibility   Klebsiella pneumoniae - MIC*    AMPICILLIN >=32 RESISTANT Resistant     CEFAZOLIN <=4 SENSITIVE Sensitive     CEFTRIAXONE <=1 SENSITIVE Sensitive     CIPROFLOXACIN 0.5 SENSITIVE Sensitive     GENTAMICIN <=1 SENSITIVE Sensitive     IMIPENEM <=0.25 SENSITIVE Sensitive     NITROFURANTOIN 128 RESISTANT Resistant     TRIMETH/SULFA <=20 SENSITIVE Sensitive     AMPICILLIN/SULBACTAM 8 SENSITIVE Sensitive     PIP/TAZO 8 SENSITIVE Sensitive     * 80,000 COLONIES/ml KLEBSIELLA PNEUMONIAE  Blood Culture (routine x 2)     Status: None (Preliminary result)   Collection Time: 12/01/14  5:02 PM  Result Value Ref Range Status   Specimen Description BLOOD LEFT HAND  Final   Special Requests BOTTLES DRAWN AEROBIC AND ANAEROBIC 5CC  Final   Culture   Final    NO GROWTH 2 DAYS Performed at [facility]    Report Status PENDING  Incomplete  Blood Culture (routine x 2)     Status: None (Preliminary result)    Collection Time: 12/01/14  5:11 PM  Result Value Ref Range Status   Specimen Description BLOOD RIGHT ANTECUBITAL  Final   Special Requests BOTTLES DRAWN AEROBIC AND ANAEROBIC 5CC  Final   Culture   Final    NO GROWTH 2 DAYS Performed at [facility]    Report Status PENDING  Incomplete  Culture, sputum-assessment     Status: None   Collection Time: 12/02/14  9:00 AM  Result Value Ref Range Status   Specimen Description SPUTUM  Final   Special Requests NONE  Final   Sputum evaluation   Final    THIS SPECIMEN IS ACCEPTABLE. RESPIRATORY CULTURE REPORT TO FOLLOW.   Report Status 12/02/2014 FINAL  Final  Culture, respiratory (NON-Expectorated)     Status: None   Collection Time: 12/02/14  9:00 AM  Result Value Ref Range Status   Specimen Description SPUTUM  Final   Special Requests NONE  Final   Gram Stain   Final    ABUNDANT WBC PRESENT, PREDOMINANTLY PMN NO SQUAMOUS EPITHELIAL CELLS SEEN RARE GRAM POSITIVE COCCI IN CLUSTERS Performed at [facility]    Culture   Final    NORMAL OROPHARYNGEAL FLORA Performed at [facility]    Report Status 12/04/2014 FINAL  Final     Scheduled Meds: . aspirin EC  81 mg Oral Daily  . atorvastatin  20 mg Oral q1800  . cefTRIAXone (ROCEPHIN)  IV  2 g Intravenous Q24H  . dextromethorphan-guaiFENesin  2 tablet Oral BID  . famotidine  20 mg Oral QHS  . gabapentin  600 mg Oral TID  . lactose free nutrition  237 mL Oral TID WC  . metroNIDAZOLE  500 mg Oral 3 times per day  . nicotine  21 mg Transdermal Daily  . pantoprazole  40 mg Oral QAC breakfast  . traZODone  50 mg Oral QHS   Continuous Infusions: . sodium chloride 125 mL/hr at 12/04/14 0848

## 2014-12-04 NOTE — Progress Notes (Signed)
PULMONARY / CRITICAL CARE MEDICINE   Name: Summer Estrada MRN: [id number] DOB: 1960/11/17    ADMISSION DATE:  12/01/2014 CONSULTATION DATE:   12/03/14   REFERRING MD :  [facility]  CHIEF COMPLAINT:  Cough/fever  INITIAL PRESENTATION: 53 yowf active cig smoker and crack cocaine smoker admitted by [facility] 9/16 with fever and cough ? Duration p suicide attempt initially admitted to [facility] and found to have necrotizing process in LUL apical segment   STUDIES:  CT chest 9/16 : Extensive necrotizing left upper lobe pneumonia with cavitations/lung abscesses. Adjacent inflammatory mediastinal lymph nodes.  SIGNIFICANT EVENTS:       SUBJECTIVE:  Comfortable, some cough overnight, no shortness of breath now Reports to me that she has not lost weight or had fevers or chills recently  VITAL SIGNS: Temp:  [98.1 F (36.7 C)-98.9 F (37.2 C)] 98.9 F (37.2 C) (09/19 0605) Pulse Rate:  [73-80] 80 (09/19 0605) Resp:  [16] 16 (09/19 0605) BP: (115)/(69-80) 115/69 mmHg (09/19 0605) SpO2:  [90 %-100 %] 90 % (09/19 0745)  FIO2  2 2lpm   HEMODYNAMICS:   VENTILATOR SETTINGS:   INTAKE / OUTPUT:  Intake/Output Summary (Last 24 hours) at 12/04/14 1354 Last data filed at 12/04/14 0333  Gross per 24 hour  Intake   3070 ml  Output   1200 ml  Net   1870 ml    PHYSICAL EXAMINATION: Gen: no distress HENT: OP clear,  neck supple PULM: CTA B, normal percussion CV: RRR, no mgr, trace edema GI: BS+, soft, nontender Derm: no cyanosis or rash Psyche: complains of pain while awake   LABS:  CBC  Recent Labs Lab 11/30/14 1945 12/01/14 1710 12/03/14 0548  WBC 20.0* 20.1* 15.5*  HGB 11.3* 9.5* 9.9*  HCT 35.2* 29.8* 31.7*  PLT 342 270 285   Coag's No results for input(s): APTT, INR in the last 168 hours. BMET  Recent Labs Lab 12/01/14 1710 12/02/14 0610 12/03/14 0548  NA 132* 135 136  K 3.9 4.0 3.5  CL 95* 102 100*  CO2 BUN 11 8 5*  CREATININE 0.57 0.49  0.56  GLUCOSE 110* 103* 101*   Electrolytes  Recent Labs Lab 12/01/14 1710 12/02/14 0610 12/03/14 0548  CALCIUM 8.2* 8.3* 8.7*   Sepsis Markers  Recent Labs Lab 12/01/14 1704  LATICACIDVEN 1.53   ABG No results for input(s): PHART, PCO2ART, PO2ART in the last 168 hours. Liver Enzymes  Recent Labs Lab 11/30/14 1945 12/01/14 1710  AST 29 33  ALT 28 30  ALKPHOS 85 76  BILITOT 0.5 0.5  ALBUMIN 3.0* 2.5*   Cardiac Enzymes  Recent Labs Lab 12/01/14 1710  TROPONINI <0.03   Glucose No results for input(s): GLUCAP in the last 168 hours.  Imaging CT chest from 916 first layer reviewed by me showing a thick-walled cavity in the left upper lobe with surrounding groundglass   ASSESSMENT / PLAN:  Necrotizing pna LUL apically ? Onset in setting of suicide attempt/ transient LOC/crack cocaine inhalation - Rx Rocephin/Flagyl 9/16 >>>  I agree with the differential diagnosis as laid out by Dr. Sherene Sires. This is most likely an acute infectious problem considering the surrounding groundglass in the thick-walled nature of the cavity. However, I do agree that tuberculosis and/or malignancy are in the differential diagnosis. Rec: Follow-up sputum AFB I have ordered quantiferon Gold Respiratory isolation Continue antibiotics therapy as you're doing now, would change to Augmentin tomorrow Agree with Dr. Thurston Hole plan of having her  complete 2 weeks of antibiotics and then have follow-up imaging as an outpatient   Heber Browning, MD [facility] Pager: [phone] Cell: [phone] After 3pm or if no response, call [phone]

## 2014-12-04 NOTE — Progress Notes (Signed)
PHARMACIST - PHYSICIAN COMMUNICATION DR:   Butler Denmark CONCERNING: Antibiotic IV to Oral Route Change Policy  RECOMMENDATION: This patient is receiving Flagyl by the intravenous route.  Based on criteria approved by the Pharmacy and Therapeutics Committee, the antibiotic(s) is/are being converted to the equivalent oral dose form(s).   DESCRIPTION: These criteria include:  Patient being treated for a respiratory tract infection, urinary tract infection, cellulitis or clostridium difficile associated diarrhea if on metronidazole  The patient is not neutropenic and does not exhibit a GI malabsorption state  The patient is eating (either orally or via tube) and/or has been taking other orally administered medications for a least 24 hours  The patient is improving clinically and has a Tmax < 100.5  If you have questions about this conversion, please contact the Pharmacy Department    [phone] )  [facility]   [phone] )  [facility]   [phone] )  [facility]   [phone] )  [facility]   [phone] )  [facility]   Junita Push, PharmD, BCPS Pager: [phone] 12/04/2014@7 :46 AM

## 2014-12-05 DIAGNOSIS — J189 Pneumonia, unspecified organism: Secondary | ICD-10-CM

## 2014-12-05 DIAGNOSIS — R6520 Severe sepsis without septic shock: Secondary | ICD-10-CM

## 2014-12-05 DIAGNOSIS — R45851 Suicidal ideations: Secondary | ICD-10-CM

## 2014-12-05 DIAGNOSIS — F418 Other specified anxiety disorders: Secondary | ICD-10-CM

## 2014-12-05 DIAGNOSIS — G4700 Insomnia, unspecified: Secondary | ICD-10-CM

## 2014-12-05 DIAGNOSIS — J850 Gangrene and necrosis of lung: Secondary | ICD-10-CM

## 2014-12-05 DIAGNOSIS — J9600 Acute respiratory failure, unspecified whether with hypoxia or hypercapnia: Secondary | ICD-10-CM

## 2014-12-05 DIAGNOSIS — Z720 Tobacco use: Secondary | ICD-10-CM

## 2014-12-05 DIAGNOSIS — A419 Sepsis, unspecified organism: Secondary | ICD-10-CM | POA: Diagnosis present

## 2014-12-05 DIAGNOSIS — F1410 Cocaine abuse, uncomplicated: Secondary | ICD-10-CM

## 2014-12-05 MED ORDER — AMOXICILLIN-POT CLAVULANATE 875-125 MG PO TABS
1.0000 | ORAL_TABLET | Freq: Two times a day (BID) | ORAL | Status: DC
  Administered 2014-12-05 – 2014-12-07 (×5): 1 via ORAL
  Filled 2014-12-05 (×5): qty 1

## 2014-12-05 MED ORDER — MORPHINE SULFATE (PF) 2 MG/ML IV SOLN: 1.0000 mg | Freq: Three times a day (TID) | INTRAVENOUS | Status: DC | PRN

## 2014-12-05 MED ORDER — DULOXETINE HCL 60 MG PO CPEP
90.0000 mg | ORAL_CAPSULE | Freq: Once | ORAL | Status: AC
  Administered 2014-12-05: 90 mg via ORAL
  Filled 2014-12-05: qty 1

## 2014-12-05 MED ORDER — MELOXICAM 15 MG PO TABS
15.0000 mg | ORAL_TABLET | Freq: Every day | ORAL | Status: DC
  Administered 2014-12-05 – 2014-12-07 (×3): 15 mg via ORAL
  Filled 2014-12-05 (×3): qty 1

## 2014-12-05 NOTE — Care Management Note (Signed)
Case Management Note  Patient Details  Name: Summer Estrada MRN: [id number] Date of Birth: [birth year]  Subjective/Objective:      54 yo admitted with Cocaine dependence with cocaine-induced mood disorder              Action/Plan: From home  Expected Discharge Date:   (unknown)               Expected Discharge Plan:  Home/Self Care  In-House Referral:     Discharge planning Services  CM Consult  Post Acute Care Choice:    Choice offered to:     DME Arranged:    DME Agency:     HH Arranged:    HH Agency:     Status of Service:  In process, will continue to follow  Medicare Important Message Given:    Date Medicare IM Given:    Medicare IM give by:    Date Additional Medicare IM Given:    Additional Medicare Important Message give by:     If discussed at Long Length of Stay Meetings, dates discussed:    Additional Comments: Chart reviewed and CM following for DC needs. Bartholome Bill, RN 12/05/2014, 2:35 PM

## 2014-12-05 NOTE — Progress Notes (Signed)
Patient ID: Summer Estrada, female   DOB: 1961/03/07, 54 y.o.   MRN: [id number]  [facility] PROGRESS NOTE  TERRE HANNEMAN OZH:[id number] DOB: [birth year] DOA: 12/01/2014 PCP: Rudi Heap, MD   Brief narrative:    54 yo female, active cigarette, crack cocaine smoker, admitted 9/16 with fever and cough of questionable duration. Pt also with suicide attempt and initially admitted to [facility] and found to have necrotizing process in LUL apical segment   Assessment/Plan:    Principal Problem:   Acute respiratory failure secondary to necrotizing PNA LUL apically  - ? Onset in setting of suicide attempt/ transient LOC/crack cocaine inhalation - continued Rocephin/Flagyl 9/16 - 9/20 and transitioned to oral Augmentin 9/20 per PCCM    - follow up on sputum AFB, quantiferon gold which are both still pending  - keep in resp isolation room for now until more data is back     Cocaine dependence with cocaine-induced mood disorder - discussed cessation with pt in detail - will need [facility] placement for further treatment once medically ready   Active Problems:   Sepsis secondary to necrotizing LUL PNA, Klebsiella UTI - for LUL PNA, Rocephin and flagyl changed to Augmentin on Sept 20th as noted above  - for Klebsiella UTI, today is day #5 of ABX which should be adequate, was sensitive to Rocephin  - pt remains afebrile, WBC trending down  - repeat CBC in AM    GERD (gastroesophageal reflux disease) - continue PPI    Depression, major, recurrent, moderate with anxiety and insomnia  - appreciate psych following  - safety continues to remain concern - per psych, will continue Neurontin 600 mg TID PO for depression and anxiety - will continue Trazodone 50 mg QHS for insomnia  - Recommended psychiatric Inpatient admission when medically cleared      HLD - continue statin     Anemia of chronic disease, IDA - no signs of active bleeding - Hg remains ~ 9    Tobacco abuse - continue to  provide Nicotine patch   DVT prophylaxis - SCD's, has reused Lovenox SQ that was initially recommended   Code Status: Full.  Family Communication:  plan of care discussed with the patient Disposition Plan: Will need to be discharged to [facility] once medically cleared   IV access:  Peripheral IV  Procedures and diagnostic studies:    Dg Chest 2 View 12/01/2014 Infiltrate or infiltrative process in left upper lobe suprahilar region.  Ct Chest W Contrast 12/01/2014  Extensive necrotizing left upper lobe pneumonia with cavitations/lung abscesses. Adjacent inflammatory mediastinal lymph nodes.  Mild underlying emphysematous changes.  No acute abdominal/ pelvic findings, mass lesions or adenopathy.    Medical Consultants:  PCCM Psych   Other Consultants:  Sitter at bedside   IAnti-Infectives:   Rocephin and Flagyl from 9/16 - 9/20 Augmentin 9/20 -->  Debbora Presto, MD  [facility] Pager [phone]  If 7PM-7AM, please contact night-coverage [email or web address] Password [email or web address] 12/05/2014, 5:55 PM   LOS: 4 days   HPI/Subjective: No events overnight.   Objective: Filed Vitals:   12/04/14 2134 12/05/14 0105 12/05/14 0555 12/05/14 1255  BP: 116/52 121/65 121/98 118/68  Pulse: 58 65 81 60  Temp: 97.8 F (36.6 C) 97.8 F (36.6 C) 98.4 F (36.9 C) 98.2 F (36.8 C)  TempSrc: Axillary Oral Oral Oral  Resp: Height:      Weight:      SpO2: 97% 97% 98% 100%    Intake/Output  Summary (Last 24 hours) at 12/05/14 1755 Last data filed at 12/05/14 1435  Gross per 24 hour  Intake 5686.67 ml  Output      0 ml  Net 5686.67 ml    Exam:   General:  Pt is alert, follows commands appropriately, not in acute distress  Cardiovascular: Regular rate and rhythm, S1/S2, no murmurs, no rubs, no gallops  Respiratory: Clear to auscultation bilaterally, no wheezing, no crackles, no rhonchi  Abdomen: Soft, non tender, non distended, bowel sounds present, no guarding  Data Reviewed: Basic  Metabolic Panel:  Recent Labs Lab 11/30/14 1945 12/01/14 1710 12/02/14 0610 12/03/14 0548  NA 134* 132* 135 136  K 4.0 3.9 4.0 3.5  CL 94* 95* 102 100*  CO2 GLUCOSE 148* 110* 103* 101*  BUN 5*  CREATININE 0.76 0.57 0.49 0.56  CALCIUM 9.0 8.2* 8.3* 8.7*   Liver Function Tests:  Recent Labs Lab 11/30/14 1945 12/01/14 1710  AST 29 33  ALT 28 30  ALKPHOS 85 76  BILITOT 0.5 0.5  PROT 7.2 6.3*  ALBUMIN 3.0* 2.5*    Recent Labs Lab 12/01/14 1710  LIPASE 12*   No results for input(s): AMMONIA in the last 168 hours. CBC:  Recent Labs Lab 11/30/14 1945 12/01/14 1710 12/03/14 0548  WBC 20.0* 20.1* 15.5*  NEUTROABS 15.9* 16.2*  --   HGB 11.3* 9.5* 9.9*  HCT 35.2* 29.8* 31.7*  MCV 82.6 82.3 82.3  PLT 342 270 285   Cardiac Enzymes:  Recent Labs Lab 12/01/14 1710  TROPONINI <0.03    Recent Results (from the past 240 hour(s))  Urine culture     Status: None   Collection Time: 12/01/14  4:06 PM  Result Value Ref Range Status   Specimen Description URINE, RANDOM  Final   Special Requests NONE  Final   Culture   Final    80,000 COLONIES/ml KLEBSIELLA PNEUMONIAE Performed at [facility]    Report Status 12/04/2014 FINAL  Final   Organism ID, Bacteria KLEBSIELLA PNEUMONIAE  Final      Susceptibility   Klebsiella pneumoniae - MIC*    AMPICILLIN >=32 RESISTANT Resistant     CEFAZOLIN <=4 SENSITIVE Sensitive     CEFTRIAXONE <=1 SENSITIVE Sensitive     CIPROFLOXACIN 0.5 SENSITIVE Sensitive     GENTAMICIN <=1 SENSITIVE Sensitive     IMIPENEM <=0.25 SENSITIVE Sensitive     NITROFURANTOIN 128 RESISTANT Resistant     TRIMETH/SULFA <=20 SENSITIVE Sensitive     AMPICILLIN/SULBACTAM 8 SENSITIVE Sensitive     PIP/TAZO 8 SENSITIVE Sensitive     * 80,000 COLONIES/ml KLEBSIELLA PNEUMONIAE  Blood Culture (routine x 2)     Status: None (Preliminary result)   Collection Time: 12/01/14  5:02 PM  Result Value Ref Range Status   Specimen  Description BLOOD LEFT HAND  Final   Special Requests BOTTLES DRAWN AEROBIC AND ANAEROBIC 5CC  Final   Culture   Final    NO GROWTH 4 DAYS Performed at [facility]    Report Status PENDING  Incomplete  Blood Culture (routine x 2)     Status: None (Preliminary result)   Collection Time: 12/01/14  5:11 PM  Result Value Ref Range Status   Specimen Description BLOOD RIGHT ANTECUBITAL  Final   Special Requests BOTTLES DRAWN AEROBIC AND ANAEROBIC 5CC  Final   Culture   Final    NO GROWTH 4 DAYS Performed at [facility]  Report Status PENDING  Incomplete  Culture, sputum-assessment     Status: None   Collection Time: 12/02/14  9:00 AM  Result Value Ref Range Status   Specimen Description SPUTUM  Final   Special Requests NONE  Final   Sputum evaluation   Final    THIS SPECIMEN IS ACCEPTABLE. RESPIRATORY CULTURE REPORT TO FOLLOW.   Report Status 12/02/2014 FINAL  Final  Culture, respiratory (NON-Expectorated)     Status: None   Collection Time: 12/02/14  9:00 AM  Result Value Ref Range Status   Specimen Description SPUTUM  Final   Special Requests NONE  Final   Gram Stain   Final    ABUNDANT WBC PRESENT, PREDOMINANTLY PMN NO SQUAMOUS EPITHELIAL CELLS SEEN RARE GRAM POSITIVE COCCI IN CLUSTERS Performed at [facility]    Culture   Final    NORMAL OROPHARYNGEAL FLORA Performed at [facility]    Report Status 12/04/2014 FINAL  Final  AFB culture with smear     Status: None (Preliminary result)   Collection Time: 12/04/14  2:46 PM  Result Value Ref Range Status   Specimen Description SPUTUM  Final   Special Requests Normal  Final   Acid Fast Smear   Final    NO ACID FAST BACILLI SEEN Performed at [facility]    Culture   Final    CULTURE WILL BE EXAMINED FOR 6 WEEKS BEFORE ISSUING A FINAL REPORT Performed at [facility]    Report Status PENDING  Incomplete  Fungus Culture with Smear     Status: None (Preliminary result)    Collection Time: 12/04/14  2:48 PM  Result Value Ref Range Status   Specimen Description SPUTUM  Final   Special Requests Normal  Final   Fungal Smear RARE YEAST Performed at [facility]   Final   Culture   Final    CULTURE IN PROGRESS FOR FOUR WEEKS Performed at [facility]    Report Status PENDING  Incomplete  Culture, expectorated sputum-assessment     Status: None   Collection Time: 12/04/14  4:28 PM  Result Value Ref Range Status   Specimen Description SPUTUM  Final   Special Requests Immunocompromised  Final   Sputum evaluation   Final    MICROSCOPIC FINDINGS SUGGEST THAT THIS SPECIMEN IS NOT REPRESENTATIVE OF LOWER RESPIRATORY SECRETIONS. PLEASE RECOLLECT. RESULTS CALLED TO FRED HERNDON,RN [id number] @ 1738 BY J SCOTTON    Report Status 12/04/2014 FINAL  Final     Scheduled Meds: . amoxicillin-clavulanate  1 tablet Oral Q12H  . aspirin EC  81 mg Oral Daily  . atorvastatin  20 mg Oral q1800  . dextromethorphan-guaiFENesin  2 tablet Oral BID  . famotidine  20 mg Oral QHS  . gabapentin  600 mg Oral TID  . lactose free nutrition  237 mL Oral TID WC  . nicotine  21 mg Transdermal Daily  . pantoprazole  40 mg Oral QAC breakfast  . traZODone  50 mg Oral QHS   Continuous Infusions: . sodium chloride 1,000 mL (12/05/14 1344)

## 2014-12-05 NOTE — Progress Notes (Signed)
PULMONARY / CRITICAL CARE MEDICINE   Name: Summer Estrada MRN: [id number] DOB: 04/03/1960    ADMISSION DATE:  12/01/2014 CONSULTATION DATE:   12/03/14   REFERRING MD :  [facility]  CHIEF COMPLAINT:  Cough/fever  INITIAL PRESENTATION: 53 yowf active cig smoker and crack cocaine smoker admitted by [facility] 9/16 with fever and cough ? Duration p suicide attempt initially [facility] to [facility] and found to have necrotizing process in LUL apical segment.  STUDIES:  CT chest 9/16 >> Extensive necrotizing left upper lobe pneumonia with cavitations/lung abscesses. Adjacent inflammatory mediastinal lymphnodes.  SIGNIFICANT EVENTS: 9/16  Admit with fever, cough.  Found to have necrotizing process in LUL      SUBJECTIVE: No acute events, sitter at bedside.    VITAL SIGNS: Temp:  [97.2 F (36.2 C)-98.4 F (36.9 C)] 98.2 F (36.8 C) (09/20 1255) Pulse Rate:  [58-81] 60 (09/20 1255) Resp:  [16-18] 18 (09/20 1255) BP: (105-121)/(52-98) 118/68 mmHg (09/20 1255) SpO2:  [97 %-100 %] 100 % (09/20 1255) Weight:  [150 lb (68.04 kg)] 150 lb (68.04 kg) (09/19 1524)  FIO2  2 2lpm   INTAKE / OUTPUT:  Intake/Output Summary (Last 24 hours) at 12/05/14 1441 Last data filed at 12/05/14 1435  Gross per 24 hour  Intake 5736.67 ml  Output      0 ml  Net 5736.67 ml    PHYSICAL EXAMINATION: Gen: no distress HENT: OP clear, neck supple PULM: CTA B, normal work of breathing CV: RRR, no mgr, trace edema GI: BS+, soft, nontender Derm: no cyanosis or rash Psyche: complains of pain when awake   LABS:  CBC  Recent Labs Lab 11/30/14 1945 12/01/14 1710 12/03/14 0548  WBC 20.0* 20.1* 15.5*  HGB 11.3* 9.5* 9.9*  HCT 35.2* 29.8* 31.7*  PLT 342 270 285   BMET  Recent Labs Lab 12/01/14 1710 12/02/14 0610 12/03/14 0548  NA 132* 135 136  K 3.9 4.0 3.5  CL 95* 102 100*  CO2 BUN 11 8 5*  CREATININE 0.57 0.49 0.56  GLUCOSE 110* 103* 101*   Electrolytes  Recent Labs Lab  12/01/14 1710 12/02/14 0610 12/03/14 0548  CALCIUM 8.2* 8.3* 8.7*   Sepsis Markers  Recent Labs Lab 12/01/14 1704  LATICACIDVEN 1.53   Liver Enzymes  Recent Labs Lab 11/30/14 1945 12/01/14 1710  AST 29 33  ALT 28 30  ALKPHOS 85 76  BILITOT 0.5 0.5  ALBUMIN 3.0* 2.5*   Cardiac Enzymes  Recent Labs Lab 12/01/14 1710  TROPONINI <0.03    Imaging CT chest from 9/16 >> thick-walled cavity in the left upper lobe with surrounding groundglass   ASSESSMENT / PLAN:  Necrotizing LUL / Apical PNA  - ? Onset in setting of suicide attempt/ transient LOC/crack cocaine inhalation.  Suspect acute infectious process with the surrounding ground glass / thick-walled nature of the cavity.  However, TB and / or malignancy also in the differential.   Plan: Rocephin/Flagyl 9/16 >> 9/20  Augmentin >>  Sputum AFB >>  Quantiferon Gold >>   Respiratory isolation Narrow abx 9/20 Complete 2 weeks of antibiotics and then have follow-up imaging as an outpatient Pulmonary hygiene Intermittent CXR Minimize sedating medications as able   Canary Brim, NP-C [facility] Pgr: [phone] or if no answer [phone] 12/05/2014, 2:41 PM

## 2014-12-06 DIAGNOSIS — E876 Hypokalemia: Secondary | ICD-10-CM

## 2014-12-06 DIAGNOSIS — T450X2A Poisoning by antiallergic and antiemetic drugs, intentional self-harm, initial encounter: Secondary | ICD-10-CM

## 2014-12-06 DIAGNOSIS — J851 Abscess of lung with pneumonia: Secondary | ICD-10-CM

## 2014-12-06 DIAGNOSIS — T1491 Suicide attempt: Secondary | ICD-10-CM

## 2014-12-06 DIAGNOSIS — F1994 Other psychoactive substance use, unspecified with psychoactive substance-induced mood disorder: Secondary | ICD-10-CM

## 2014-12-06 LAB — QUANTIFERON IN TUBE
QFT TB AG MINUS NIL VALUE: 0 IU/mL
QFT TB AG MINUS NIL VALUE: 0 [IU]/mL
QUANTIFERON MITOGEN VALUE: 0.41 IU/mL
QUANTIFERON MITOGEN VALUE: 0.72 [IU]/mL
QUANTIFERON NIL VALUE: 0.02 [IU]/mL
QUANTIFERON NIL VALUE: 0.03 [IU]/mL
QUANTIFERON TB AG VALUE: 0.02 IU/mL
QUANTIFERON TB AG VALUE: 0.03 IU/mL
QUANTIFERON TB GOLD: UNDETERMINED
QUANTIFERON TB GOLD: NEGATIVE

## 2014-12-06 LAB — QUANTIFERON TB GOLD ASSAY (BLOOD)

## 2014-12-06 LAB — BASIC METABOLIC PANEL
Anion gap: 8 (ref 5–15)
CHLORIDE: 103 mmol/L (ref 101–111)
CO2: 29 mmol/L (ref 22–32)
Calcium: 8.5 mg/dL — ABNORMAL LOW (ref 8.9–10.3)
Creatinine, Ser: 0.45 mg/dL (ref 0.44–1.00)
Glucose, Bld: 94 mg/dL (ref 65–99)
POTASSIUM: 3.2 mmol/L — AB (ref 3.5–5.1)
SODIUM: 140 mmol/L (ref 135–145)

## 2014-12-06 LAB — CULTURE, BLOOD (ROUTINE X 2)
Culture: NO GROWTH
Culture: NO GROWTH

## 2014-12-06 LAB — CBC
HCT: 30.5 % — ABNORMAL LOW (ref 36.0–46.0)
HEMOGLOBIN: 9.5 g/dL — AB (ref 12.0–15.0)
MCH: 25.7 pg — AB (ref 26.0–34.0)
MCHC: 31.1 g/dL (ref 30.0–36.0)
MCV: 82.4 fL (ref 78.0–100.0)
PLATELETS: 375 10*3/uL (ref 150–400)
RBC: 3.7 MIL/uL — AB (ref 3.87–5.11)
RDW: 14.6 % (ref 11.5–15.5)
WBC: 8.8 10*3/uL (ref 4.0–10.5)

## 2014-12-06 NOTE — Progress Notes (Signed)
Pharmacy has been consulted to assist with renal dosing of antibiotics (Augmentin for pneumonia) Dosage remains stable at 875/125mg  BID and need for further dosage adjustment appears unlikely at present.  Will sign off at this time. Please reconsult if a change in clinical status warrants re-evaluation of dosage  Loralee Pacas, PharmD, BCPS Pager: [phone] 12/06/2014 8:51 AM

## 2014-12-06 NOTE — Consult Note (Signed)
[facility] Consult   Reason for Consult:  Substance induced mood disorder  Referring Physician:  Dr. Wendee Beavers Patient Identification: ROCHEL PRIVETT MRN:  [id number] Principal Diagnosis: Sepsis due to undetermined organism with acute respiratory failure Diagnosis:   Patient Active Problem List   Diagnosis Date Noted  . Hypokalemia [E87.6] 12/06/2014  . Necrotizing pneumonia [J85.0] 12/05/2014  . Sepsis due to undetermined organism with acute respiratory failure [A41.9, R65.20, J96.00] 12/05/2014  . Left upper lobe pneumonia [J18.9] 12/05/2014  . Suicidal intent [R45.851] 12/05/2014  . Cocaine dependence with cocaine-induced mood disorder [F14.24] 11/27/2014  . Depression, major, recurrent, moderate [F33.1] 11/27/2014  . Cocaine abuse, continuous use [F14.10] 11/24/2014  . Hyperlipidemia [E78.5]   . GERD (gastroesophageal reflux disease) [K21.9]   . Smoker [Z72.0] 04/14/2012  . Insomnia [G47.00] 02/20/2012  . Anxiety and depression [F41.8] 08/08/2011    Total Time spent with patient: 1 hour  Subjective:   Summer Estrada is a 54 y.o. female patient admitted with substance induced mood disorder.  HPI:  Summer Estrada is a 54 y.o. female seen face-to-face psychiatric consultation and evaluation of status post suicidal attempt and also cocaine introduced mood disorder and was recently admitted to [facility]. Patient reported she has received treatment for her substance abuse and started feeling much better and stable. Before she was discharged to the outpatient care/rehabilitation treatment she becomes physically sick and then found to have pneumonia which required her to be transferred to the [facility]. Patient stated that before admitted to psychiatric hospital she took overdose of medication to kill herself and then next day woke up and stated that guarded given her new birth and she wanted to get help so she came to the  hospital. Patient denies current symptoms of depression, anxiety, psychosis, craving for drugs, suicidal/homicidal ideation, intention or plans. Patient contract for safety while in the hospital. As for the chart: The patient has been smoking crack again for the last few months. One week ago, she was evicted from her trailer, and she subsequently committed suicide with sleeping pills, but just passed out all night and woke up the next day. She subsequently presented for help and was admitted to [facility] for suicide. Although she thinks she may have had intermittent fever for months, over the past week, she has had fever, flank pain, shoulder pain, and progressive cough, pleuritic pain and shortness of breath until today she was transferred from [facility] over to [facility].   HPI Elements:   Location:  Substance-induced mood disorder. Quality:  Fair. Severity:  Status post overdose sleep with sleeping pills but a week ago. Timing:  Currently admitted to medical floor with a pneumonia. Duration:  Few weeks. Context:  Psychosocial stressors, lives alone and disabled.  Interval history: Patient admitted to behavioral [place] initially for suicidal attempt and substance abuse induced mood disorder and than transferred to [facility]. Patient has received reevaluation today. She is calm, cooperative and pleasant during this visit. She is anxious about making disposition plans like place to live and take care of herself, reportedly wants to be responsible. She has been stable without emotional and behavioral problems and willing to contract for safety and participate in substance rehab treatment after discharged from medical hospital. Case discussed with physician, safety sitter and staff RN and has no safety concerns or suicide behaviors.   Past Medical History:  Past Medical History  Diagnosis Date  . Knee pain   . Chronic  pain   . Wears glasses   . Wears dentures     upper  . GERD (gastroesophageal reflux disease)      occ  . Depression   . Arthritis   . Hyperlipidemia   . Anxiety     Past Surgical History  Procedure Laterality Date  . Total hip arthroplasty  2010    lt hip  . Cesarean section      x2  . Tubal ligation     Family History:  Family History  Problem Relation Age of Onset  . Kidney disease Mother   . Heart disease Father    Social History:  History  Alcohol Use No     History  Drug Use  . Yes  . Special: Cocaine    Social History   Social History  . Marital Status: Widowed    Spouse Name: N/A  . Number of Children: N/A  . Years of Education: N/A   Social History Main Topics  . Smoking status: Current Every Day Smoker -- 1.50 packs/day  . Smokeless tobacco: Never Used  . Alcohol Use: No  . Drug Use: Yes    Special: Cocaine  . Sexual Activity: Yes    Birth Control/ Protection: Post-menopausal     Comment: none   Other Topics Concern  . None   Social History Narrative   WIDOW   CHILDREN; ADULT GIRLS; 3   DISABLED   Additional Social History:                          Allergies:   Allergies  Allergen Reactions  . Codeine Nausea And Vomiting and Rash    Rash all over body; severe nausea and vomiting.    Labs:  Results for orders placed or performed during the hospital encounter of 12/01/14 (from the past 48 hour(s))  AFB culture with smear     Status: None (Preliminary result)   Collection Time: 12/04/14  2:46 PM  Result Value Ref Range   Specimen Description SPUTUM    Special Requests Normal    Acid Fast Smear      NO ACID FAST BACILLI SEEN Performed at [place] 6 WEEKS BEFORE ISSUING A FINAL REPORT Performed at [facility]    Report Status PENDING   Fungus Culture with Smear     Status: None (Preliminary result)   Collection Time: 12/04/14  2:48 PM  Result Value Ref Range   Specimen Description SPUTUM    Special Requests Normal    Fungal Smear RARE  YEAST Performed at [place] Performed at [facility]    Report Status PENDING   Culture, expectorated sputum-assessment     Status: None   Collection Time: 12/04/14  4:28 PM  Result Value Ref Range   Specimen Description SPUTUM    Special Requests Immunocompromised    Sputum evaluation      MICROSCOPIC FINDINGS SUGGEST THAT THIS SPECIMEN IS NOT REPRESENTATIVE OF LOWER RESPIRATORY SECRETIONS. PLEASE RECOLLECT. RESULTS CALLED TO FRED HERNDON,RN [id number] @ [id number] BY J SCOTTON    Report Status 12/04/2014 FINAL   CBC     Status: Abnormal   Collection Time: 12/06/14  4:02 AM  Result Value Ref Range   WBC 8.8 4.0 - 10.5 K/uL  RBC 3.70 (L) 3.87 - 5.11 MIL/uL   Hemoglobin 9.5 (L) 12.0 - 15.0 g/dL   HCT 30.5 (L) 36.0 - 46.0 %   MCV 82.4 78.0 - 100.0 fL   MCH 25.7 (L) 26.0 - 34.0 pg   MCHC 31.1 30.0 - 36.0 g/dL   RDW 14.6 11.5 - 15.5 %   Platelets 375 150 - 400 K/uL  Basic metabolic panel     Status: Abnormal   Collection Time: 12/06/14  4:02 AM  Result Value Ref Range   Sodium 140 135 - 145 mmol/L   Potassium 3.2 (L) 3.5 - 5.1 mmol/L   Chloride 103 101 - 111 mmol/L   CO2 29 22 - 32 mmol/L   Glucose, Bld 94 65 - 99 mg/dL   BUN <5 (L) 6 - 20 mg/dL   Creatinine, Ser 0.45 0.44 - 1.00 mg/dL   Calcium 8.5 (L) 8.9 - 10.3 mg/dL   GFR calc non Af Amer >60 >60 mL/min   GFR calc Af Amer >60 >60 mL/min    Comment: (NOTE) The eGFR has been calculated using the CKD EPI equation. This calculation has not been validated in all clinical situations. eGFR's persistently <60 mL/min signify possible Chronic Kidney Disease.    Anion gap 8 5 - 15    Vitals: Blood pressure 108/96, pulse 60, temperature 97.4 F (36.3 C), temperature source Oral, resp. rate 18, height $RemoveBe'5\' 3"'BfdxTaeZh$  (1.6 m), weight 68.04 kg (150 lb), SpO2 98 %.  Risk to Self: Is patient at risk for suicide?: Yes Risk to Others:   Prior Inpatient Therapy:   Prior  Outpatient Therapy:    Current Facility-Administered Medications  Medication Dose Route Frequency Provider Last Rate Last Dose  . amoxicillin-clavulanate (AUGMENTIN) 875-125 MG per tablet 1 tablet  1 tablet Oral Q12H Donita Brooks, NP   1 tablet at 12/06/14 0956  . aspirin EC tablet 81 mg  81 mg Oral Daily Edwin Dada, MD   81 mg at 12/06/14 0956  . atorvastatin (LIPITOR) tablet 20 mg  20 mg Oral q1800 Edwin Dada, MD   20 mg at 12/05/14 1731  . carisoprodol (SOMA) tablet 350 mg  350 mg Oral Q8H PRN Edwin Dada, MD   350 mg at 12/04/14 1437  . dextromethorphan-guaiFENesin (MUCINEX DM) 30-600 MG per 12 hr tablet 2 tablet  2 tablet Oral BID Tanda Rockers, MD   2 tablet at 12/06/14 [phone]  . famotidine (PEPCID) tablet 20 mg  20 mg Oral QHS Tanda Rockers, MD   20 mg at 12/05/14 2220  . gabapentin (NEURONTIN) capsule 600 mg  600 mg Oral TID Edwin Dada, MD   600 mg at 12/06/14 0956  . guaiFENesin-dextromethorphan (ROBITUSSIN DM) 100-10 MG/5ML syrup 5 mL  5 mL Oral Q4H PRN Theodis Blaze, MD   5 mL at 12/04/14 1713  . HYDROcodone-acetaminophen (NORCO/VICODIN) 5-325 MG per tablet 1-2 tablet  1-2 tablet Oral Q4H PRN Theodis Blaze, MD   2 tablet at 12/06/14 1000  . ipratropium-albuterol (DUONEB) 0.5-2.5 (3) MG/3ML nebulizer solution 3 mL  3 mL Nebulization Q4H PRN Jeryl Columbia, NP   3 mL at 12/05/14 0627  . lactose free nutrition (BOOST PLUS) liquid 237 mL  237 mL Oral TID WC Debbe Odea, MD   237 mL at 12/03/14 1200  . meloxicam (MOBIC) tablet 15 mg  15 mg Oral Daily Rhetta Mura Schorr, NP   15 mg at 12/06/14 0956  . nicotine (NICODERM  CQ - dosed in mg/24 hours) patch 21 mg  21 mg Transdermal Daily Edwin Dada, MD   21 mg at 12/06/14 0956  . pantoprazole (PROTONIX) EC tablet 40 mg  40 mg Oral QAC breakfast Tanda Rockers, MD   40 mg at 12/06/14 7741  . traZODone (DESYREL) tablet 50 mg  50 mg Oral QHS Edwin Dada, MD   50 mg at 12/05/14 2220     Musculoskeletal: Strength & Muscle Tone: within normal limits Gait & Station: unable to stand Patient leans: N/A  Psychiatric Specialty Exam: Physical Exam as per history and physical   ROS shortness of breath, generalized chest pain, generalized weakness No Fever-chills, No Headache, No changes with Vision or hearing, reports vertigo No problems swallowing food or Liquids, No Chest pain, Cough or Shortness of Breath, No Abdominal pain, No Nausea or Vommitting, Bowel movements are regular, No Blood in stool or Urine, No dysuria, No new skin rashes or bruises, No new joints pains-aches,  No new weakness, tingling, numbness in any extremity, No recent weight gain or loss, No polyuria, polydypsia or polyphagia,   A full 10 point Review of Systems was done, except as stated above, all other Review of Systems were negative.  Blood pressure 108/96, pulse 60, temperature 97.4 F (36.3 C), temperature source Oral, resp. rate 18, height $RemoveBe'5\' 3"'IbBSaRrpl$  (1.6 m), weight 68.04 kg (150 lb), SpO2 98 %.Body mass index is 26.58 kg/(m^2).  General Appearance: Casual  Eye Contact::  Good  Speech:  Clear and Coherent and Slow  Volume:  Decreased  Mood:  Anxious and Depressed  Affect:  Appropriate and Congruent  Thought Process:  Coherent and Goal Directed  Orientation:  Full (Time, Place, and Person)  Thought Content:  WDL  Suicidal Thoughts:  No  Homicidal Thoughts:  No  Memory:  Immediate;   Good Recent;   Good  Judgement:  Fair  Insight:  Fair  Psychomotor Activity:  Decreased  Concentration:  Good  Recall:  Good  Fund of Knowledge:Good  Language: Good  Akathisia:  Negative  Handed:  Right  AIMS (if indicated):     Assets:  Communication Skills Desire for Improvement Financial Resources/Insurance Housing Leisure Time Resilience Transportation  ADL's:  Impaired  Cognition: WNL  Sleep:      Medical Decision Making: New problem, with additional work up planned, Review of  Psycho-Social Stressors (1), Review or order clinical lab tests (1), Established Problem, Worsening (2), Review of Last Therapy Session (1), Review or order medicine tests (1), Review of Medication Regimen & Side Effects (2) and Review of New Medication or Change in Dosage (2)  Treatment Plan Summary: Patient admitted to behavioral [facility] for suicidal attempt and substance abuse induced mood disorder. She has been stable without emotional and behavioral problems and willing to contract for safety and participate in substance rehab treatment after discharged from medical hospital. Daily contact with patient to assess and evaluate symptoms and progress in treatment and Medication management  Plan: Discontinuous safety sitter as patient started feeling better and contract for safety Continue Neurontin 600 mg 3 times daily for depression  Continue Trazodone 50 mg at bedtime for insomnia No evidence of imminent risk to self or others at present.   Patient does not meet criteria for psychiatric inpatient admission. Supportive therapy provided about ongoing stressors. Appreciate psychiatric consultation and will sign off at this time Please contact [phone] or [phone] if needs further assistance  Disposition: Patient will be referred  to psychiatric social service for residential substance abuse treatment.  JONNALAGADDA,JANARDHAHA R. 12/06/2014 2:25 PM

## 2014-12-06 NOTE — Clinical Social Work Psych Assess (Signed)
Clinical Social Work Librarian, academic  Clinical Social Worker:  Loleta Dicker, LCSW Date/Time:  12/06/2014, 3:46 PM Referred By:  Physician Date Referred:    Reason for Referral:  Substance Abuse   Presenting Symptoms/Problems  Presenting Symptoms/Problems(in person's/family's own words): Per patient, she is here at [facility] for substance abuse. Patient states she smokes crack cocaine and feels that is her main concern.    Abuse/Neglect/Trauma History  Abuse/Neglect/Trauma History:  Denies History (No history of abuse reported) Abuse/Neglect/Trauma History Comments (indicate dates):  N/A   Psychiatric History  Psychiatric History:  Inpatient/Hospitalization Psychiatric Medication:   . traZODone  50 mg Oral QHS     Current Mental Health Hospitalizations/Previous Mental Health History: Patient recently admitted to [facility] for SI with plan to overdose. No other hospitalizations reported at this time.    Current Provider:  Family Medicine  Place and Date:  [facility]  Current Medications:   . amoxicillin-clavulanate  1 tablet Oral Q12H  . aspirin EC  81 mg Oral Daily  . atorvastatin  20 mg Oral q1800  . dextromethorphan-guaiFENesin  2 tablet Oral BID  . famotidine  20 mg Oral QHS  . gabapentin  600 mg Oral TID  . lactose free nutrition  237 mL Oral TID WC  . meloxicam  15 mg Oral Daily  . nicotine  21 mg Transdermal Daily  . pantoprazole  40 mg Oral QAC breakfast  . traZODone  50 mg Oral QHS     Previous Inpatient Admission/Date/Reason: Patient recently admitted to [facility] for SI with plan to overdose (September 16th-September 19th).   Emotional Health/Current Symptoms  Suicide/Self Harm: Suicide Attempt in the Past (date/description), Suicidal Ideation (ex. "I can't take anymore, I wish I could disappear") Suicide Attempt in Past (date/description):  Per patient, no prior suicide attempts.   Other Harmful Behavior (ex. homicidal ideation)  (describe):  N/A   Psychotic/Dissociative Symptoms  Psychotic/Dissociative Symptoms: None Reported Other Psychotic/Dissociative Symptoms:  N/A   Attention/Behavioral Symptoms  Attention/Behavioral Symptoms: Within Normal Limits Other Attention/Behavioral Symptoms:  N/A   Cognitive Impairment  Cognitive Impairment:  Within Normal Limits Other Cognitive Impairment:  N/A   Mood and Adjustment  Mood and Adjustment:  Mood Congruent   Stress, Anxiety, Trauma, Any Recent Loss/Stressor  Stress, Anxiety, Trauma, Any Recent Loss/Stressor: Grief/Loss (recent or history), Other - See Comment (Patient reports the lost of her husband and his ashes, and the lost of their house as stressors ) Anxiety (frequency):  Per patient, she reports feelings of depression and hopelessness every time she thinks of her husband.  Phobia (specify):  N/A  Compulsive Behavior (specify):  N/A  Obsessive Behavior (specify):  N/A  Other Stress, Anxiety, Trauma, Any Recent Loss/Stressor:  N/A   Substance Abuse/Use  Substance Abuse/Use: Current Substance Use, Substance Abuse Treatment Needed SBIRT Completed (please refer for detailed history): No Self-reported Substance Use (last use and frequency):  Patient reports smoking crack cocaine daily. Patient reports that she has been smoking crack cocaine since the loss of her husband in three years ago. Patient denies the use of any other drugs.   Urinary Drug Screen Completed: Yes Alcohol Level:  N/A   Environment/Housing/Living Arrangement  Environmental/Housing/Living Arrangement: Homeless Who is in the Home:  Per patient, she has been living in her home in [place] for the past two months. Patient later reported losing her home.   Emergency Contact:   Dalton,Ed    [phone]      Financial  Financial: Medicaid  Patient's Strengths and Goals  Patient's Strengths and Goals (patient's own words):  Patient reports her seeking treatment  is a strength.    Clinical Social Worker's Interpretive Summary  Clinical Social Workers Interpretive Summary:  CSW went to speak with patient regarding substance abuse and mental health concerns. Patient reports being admitted to [facility] on September 16th for SI with plan to overdose. Right before being discharged to outpatient/rehabilitation, the patient became very sick.  Patient was transferred from [facility] to [facility] long ED where she was found to have pnuemonia. Patient reports before being admitted to [facility], she had took a handful of medication to kill herself. Patient stated, "she wanted to die but if God woke her up then she knew she needed to get her life in order". Patient denies any SI/HI/AVH. Patient reports smoking crack cocaine for the past 3 years. Patient reports losing her husband around the same time. Patient reports being diagnosed with Manic Depressive Disorder in 2010 by a Psychiatrist in [place]. Patient reports this incident as her first attempt to overdose. No prior hospitalizations reported. Patient reports her main concern is substance abuse. Patient reports receiving substance abuse treatment at [facility] prior to being admitted into [facility].  Patient expressed to CSW that she does not want to go back to [facility]. Patient states she just wants to start a new life elsewhere. Patient listed an ALF, low-income based housing, or extensive rehab. CSW and MD to continue following.    Disposition  Disposition: Recommend Psych CSW Continuing To Support While In [facility]

## 2014-12-06 NOTE — Progress Notes (Signed)
I spoke with Summer Estrada,infection control and asked if we can d/c airborne precaution,she told me to call Dr. Kendrick Fries. MD gave  the order to d/c airborne precaution.

## 2014-12-06 NOTE — Progress Notes (Signed)
12/06/14 Received call from Hulda Marin, RN wanting to know if it was ok to take the patient off Airborne Precautions - I reviewed chart and determined that from an Infection Prevention standpoint it is all right to discontinue the Precautions.  I provided this information to Lupita Leash but, ask that she call the patient's physician to get an order for the discontinuation of Airborne Precautions. Leana Gamer, RN, C[facility] Infection Prevention Specialitst

## 2014-12-06 NOTE — Progress Notes (Signed)
I called SW per patient' request to speak to her. SW will follow-up.

## 2014-12-06 NOTE — Progress Notes (Signed)
Patient ID: Summer Estrada, female   DOB: December 14, 1960, 54 y.o.   MRN: [id number]  [facility] PROGRESS NOTE  Summer Estrada:[id number] DOB: 1960/03/20 DOA: 12/01/2014 PCP: Rudi Heap, MD   Brief narrative:    54 yo female, active cigarette, crack cocaine smoker, admitted 9/16 with fever and cough of questionable duration. Pt also with suicide attempt and initially admitted to [facility] and found to have necrotizing process in LUL apical segment.  Today patient denies any suicide ideation. Psychiatry reconsulted.  Assessment/Plan:    Principal Problem:   Acute respiratory failure secondary to necrotizing PNA LUL apically  - ? Onset in setting of suicide attempt/ transient LOC/crack cocaine inhalation - continued Rocephin/Flagyl 9/16 - 9/20 and transitioned to oral Augmentin 9/20 per PCCM. Patient per their note will require 14 days of antibiotic therapy and f/u with Dr. Sherene Sires in 2 wks  - sputum AFB negative, quantiferon gold reported no TB Ag    Cocaine dependence with cocaine-induced mood disorder - Will recommend cessation.  Active Problems:   Sepsis secondary to necrotizing LUL PNA, Klebsiella UTI - for LUL PNA, Rocephin and flagyl changed to Augmentin on Sept 20th as noted above  - for Klebsiella UTI, today is day #6 of ABX which should be adequate, was sensitive to Rocephin  - pt remains afebrile, WBC trending down     GERD (gastroesophageal reflux disease) - continue PPI    Depression, major, recurrent, moderate with anxiety and insomnia  - appreciate psych following  - Patient reevaluated by psychiatry and plan will be for patient to be referred to psychiatric social services for residential substance abuse treatment.      HLD - continue statin     Anemia of chronic disease, IDA - no signs of active bleeding - Hg remains ~ 9    Tobacco abuse - continue to provide Nicotine patch   DVT prophylaxis - SCD's, has reused Lovenox SQ that was initially  recommended   Code Status: Full.  Family Communication:  plan of care discussed with the patient Disposition Plan: We'll discharge to home with outpatient psychiatric follow-up for residential substance abuse treatment  IV access:  Peripheral IV  Procedures and diagnostic studies:    Dg Chest 2 View 12/01/2014 Infiltrate or infiltrative process in left upper lobe suprahilar region.  Ct Chest W Contrast 12/01/2014  Extensive necrotizing left upper lobe pneumonia with cavitations/lung abscesses. Adjacent inflammatory mediastinal lymph nodes.  Mild underlying emphysematous changes.  No acute abdominal/ pelvic findings, mass lesions or adenopathy.    Medical Consultants:  PCCM Psych   Other Consultants:  Sitter at bedside   IAnti-Infectives:   Rocephin and Flagyl from 9/16 - 9/20 Augmentin 9/20 -->  Penny Pia, MD  [facility] Pager [phone]  If 7PM-7AM, please contact night-coverage [email or web address] Password [email or web address] 12/06/2014, 4:02 PM   LOS: 5 days   HPI/Subjective: No events overnight.   Objective: Filed Vitals:   12/05/14 2118 12/06/14 0500 12/06/14 0523 12/06/14 1417  BP: 110/57 136/71  108/96  Pulse: 63 58 73 60  Temp: 98.4 F (36.9 C) 97.6 F (36.4 C)  97.4 F (36.3 C)  TempSrc: Oral Oral  Oral  Resp: Height:      Weight:      SpO2: 99% 98% 95% 98%    Intake/Output Summary (Last 24 hours) at 12/06/14 1602 Last data filed at 12/06/14 1117  Gross per 24 hour  Intake   1200 ml  Output  0 ml  Net   1200 ml    Exam:   General:  Pt is alert, follows commands appropriately, not in acute distress  Cardiovascular: Regular rate and rhythm, S1/S2, no murmurs, no rubs, no gallops  Respiratory: Clear to auscultation bilaterally, no wheezing, no crackles, no rhonchi, no increased wob  Abdomen: Soft, non tender, non distended, bowel sounds present, no guarding  Data Reviewed: Basic Metabolic Panel:  Recent Labs Lab 11/30/14 1945 12/01/14 1710  12/02/14 0610 12/03/14 0548 12/06/14 0402  NA 134* 132* 135 136 140  K 4.0 3.9 4.0 3.5 3.2*  CL 94* 95* 102 100* 103  CO2 GLUCOSE 148* 110* 103* 101* 94  BUN 5* <5*  CREATININE 0.76 0.57 0.49 0.56 0.45  CALCIUM 9.0 8.2* 8.3* 8.7* 8.5*   Liver Function Tests:  Recent Labs Lab 11/30/14 1945 12/01/14 1710  AST 29 33  ALT 28 30  ALKPHOS 85 76  BILITOT 0.5 0.5  PROT 7.2 6.3*  ALBUMIN 3.0* 2.5*    Recent Labs Lab 12/01/14 1710  LIPASE 12*   No results for input(s): AMMONIA in the last 168 hours. CBC:  Recent Labs Lab 11/30/14 1945 12/01/14 1710 12/03/14 0548 12/06/14 0402  WBC 20.0* 20.1* 15.5* 8.8  NEUTROABS 15.9* 16.2*  --   --   HGB 11.3* 9.5* 9.9* 9.5*  HCT 35.2* 29.8* 31.7* 30.5*  MCV 82.6 82.3 82.3 82.4  PLT 342 270 285 375   Cardiac Enzymes:  Recent Labs Lab 12/01/14 1710  TROPONINI <0.03    Recent Results (from the past 240 hour(s))  Urine culture     Status: None   Collection Time: 12/01/14  4:06 PM  Result Value Ref Range Status   Specimen Description URINE, RANDOM  Final   Special Requests NONE  Final   Culture   Final    80,000 COLONIES/ml KLEBSIELLA PNEUMONIAE Performed at [facility]    Report Status 12/04/2014 FINAL  Final   Organism ID, Bacteria KLEBSIELLA PNEUMONIAE  Final      Susceptibility   Klebsiella pneumoniae - MIC*    AMPICILLIN >=32 RESISTANT Resistant     CEFAZOLIN <=4 SENSITIVE Sensitive     CEFTRIAXONE <=1 SENSITIVE Sensitive     CIPROFLOXACIN 0.5 SENSITIVE Sensitive     GENTAMICIN <=1 SENSITIVE Sensitive     IMIPENEM <=0.25 SENSITIVE Sensitive     NITROFURANTOIN 128 RESISTANT Resistant     TRIMETH/SULFA <=20 SENSITIVE Sensitive     AMPICILLIN/SULBACTAM 8 SENSITIVE Sensitive     PIP/TAZO 8 SENSITIVE Sensitive     * 80,000 COLONIES/ml KLEBSIELLA PNEUMONIAE  Blood Culture (routine x 2)     Status: None   Collection Time: 12/01/14  5:02 PM  Result Value Ref Range Status   Specimen  Description BLOOD LEFT HAND  Final   Special Requests BOTTLES DRAWN AEROBIC AND ANAEROBIC 5CC  Final   Culture   Final    NO GROWTH 5 DAYS Performed at [facility]    Report Status 12/06/2014 FINAL  Final  Blood Culture (routine x 2)     Status: None   Collection Time: 12/01/14  5:11 PM  Result Value Ref Range Status   Specimen Description BLOOD RIGHT ANTECUBITAL  Final   Special Requests BOTTLES DRAWN AEROBIC AND ANAEROBIC 5CC  Final   Culture   Final    NO GROWTH 5 DAYS Performed at [facility]    Report Status 12/06/2014 FINAL  Final  Culture, sputum-assessment     Status: None   Collection Time: 12/02/14  9:00 AM  Result Value Ref Range Status   Specimen Description SPUTUM  Final   Special Requests NONE  Final   Sputum evaluation   Final    THIS SPECIMEN IS ACCEPTABLE. RESPIRATORY CULTURE REPORT TO FOLLOW.   Report Status 12/02/2014 FINAL  Final  Culture, respiratory (NON-Expectorated)     Status: None   Collection Time: 12/02/14  9:00 AM  Result Value Ref Range Status   Specimen Description SPUTUM  Final   Special Requests NONE  Final   Gram Stain   Final    ABUNDANT WBC PRESENT, PREDOMINANTLY PMN NO SQUAMOUS EPITHELIAL CELLS SEEN RARE GRAM POSITIVE COCCI IN CLUSTERS Performed at [facility]    Culture   Final    NORMAL OROPHARYNGEAL FLORA Performed at [facility]    Report Status 12/04/2014 FINAL  Final  AFB culture with smear     Status: None (Preliminary result)   Collection Time: 12/04/14  2:46 PM  Result Value Ref Range Status   Specimen Description SPUTUM  Final   Special Requests Normal  Final   Acid Fast Smear   Final    NO ACID FAST BACILLI SEEN Performed at [facility]    Culture   Final    CULTURE WILL BE EXAMINED FOR 6 WEEKS BEFORE ISSUING A FINAL REPORT Performed at [facility]    Report Status PENDING  Incomplete  Fungus Culture with Smear     Status: None (Preliminary result)   Collection  Time: 12/04/14  2:48 PM  Result Value Ref Range Status   Specimen Description SPUTUM  Final   Special Requests Normal  Final   Fungal Smear RARE YEAST Performed at [facility]   Final   Culture   Final    CULTURE IN PROGRESS FOR FOUR WEEKS Performed at [facility]    Report Status PENDING  Incomplete  Culture, expectorated sputum-assessment     Status: None   Collection Time: 12/04/14  4:28 PM  Result Value Ref Range Status   Specimen Description SPUTUM  Final   Special Requests Immunocompromised  Final   Sputum evaluation   Final    MICROSCOPIC FINDINGS SUGGEST THAT THIS SPECIMEN IS NOT REPRESENTATIVE OF LOWER RESPIRATORY SECRETIONS. PLEASE RECOLLECT. RESULTS CALLED TO FRED HERNDON,RN [id number] @ 1738 BY J SCOTTON    Report Status 12/04/2014 FINAL  Final     Scheduled Meds: . amoxicillin-clavulanate  1 tablet Oral Q12H  . aspirin EC  81 mg Oral Daily  . atorvastatin  20 mg Oral q1800  . dextromethorphan-guaiFENesin  2 tablet Oral BID  . famotidine  20 mg Oral QHS  . gabapentin  600 mg Oral TID  . lactose free nutrition  237 mL Oral TID WC  . meloxicam  15 mg Oral Daily  . nicotine  21 mg Transdermal Daily  . pantoprazole  40 mg Oral QAC breakfast  . traZODone  50 mg Oral QHS   Continuous Infusions:

## 2014-12-06 NOTE — Progress Notes (Signed)
PULMONARY / CRITICAL CARE MEDICINE   Name: AHLANI WICKES MRN: [id number] DOB: [birth year]    ADMISSION DATE:  12/01/2014 CONSULTATION DATE:   12/03/14   REFERRING MD :  [facility]  CHIEF COMPLAINT:  Cough/fever  INITIAL PRESENTATION: 53 yowf active cig smoker and crack cocaine smoker admitted by [facility] 9/16 with fever and cough ? Duration p suicide attempt initially admitted to [facility] and found to have necrotizing process in LUL apical segment.  STUDIES:  CT chest 9/16 >> Extensive necrotizing left upper lobe pneumonia with cavitations/lung abscesses. Adjacent inflammatory mediastinal lymphnodes.  SIGNIFICANT EVENTS: 9/16  Admit with fever, cough.  Found to have necrotizing process in LUL      SUBJECTIVE: No acute events, sitter at bedside.  Pt more alert today.  Asking to see PSY.    VITAL SIGNS: Temp:  [97.6 F (36.4 C)-98.4 F (36.9 C)] 97.6 F (36.4 C) (09/21 0500) Pulse Rate:  [58-73] 73 (09/21 0523) Resp:  [17-18] 18 (09/21 0500) BP: (110-136)/(57-71) 136/71 mmHg (09/21 0500) SpO2:  [95 %-100 %] 95 % (09/21 0523)  Room Air  INTAKE / OUTPUT:  Intake/Output Summary (Last 24 hours) at 12/06/14 1238 Last data filed at 12/06/14 1117  Gross per 24 hour  Intake 2421.25 ml  Output      0 ml  Net 2421.25 ml    PHYSICAL EXAMINATION: Gen: no distress HENT: OP clear, neck supple PULM: CTA B, normal work of breathing on room air CV: RRR, no mgr, trace edema GI: BS+, soft, nontender Derm: no cyanosis or rash Psyche: complains of pain when awake, easily agitated during conversation   LABS:  CBC  Recent Labs Lab 12/01/14 1710 12/03/14 0548 12/06/14 0402  WBC 20.1* 15.5* 8.8  HGB 9.5* 9.9* 9.5*  HCT 29.8* 31.7* 30.5*  PLT 270 285 375   BMET  Recent Labs Lab 12/02/14 0610 12/03/14 0548 12/06/14 0402  NA 135 136 140  K 4.0 3.5 3.2*  CL 102 100* 103  CO2 BUN 8 5* <5*  CREATININE 0.49 0.56 0.45  GLUCOSE 103* 101* 94    Electrolytes  Recent Labs Lab 12/02/14 0610 12/03/14 0548 12/06/14 0402  CALCIUM 8.3* 8.7* 8.5*   Sepsis Markers  Recent Labs Lab 12/01/14 1704  LATICACIDVEN 1.53   Liver Enzymes  Recent Labs Lab 11/30/14 1945 12/01/14 1710  AST 29 33  ALT 28 30  ALKPHOS 85 76  BILITOT 0.5 0.5  ALBUMIN 3.0* 2.5*   Cardiac Enzymes  Recent Labs Lab 12/01/14 1710  TROPONINI <0.03    Imaging CT chest from 9/16 >> thick-walled cavity in the left upper lobe with surrounding groundglass   ASSESSMENT / PLAN:  Necrotizing LUL / Apical PNA  - ? Onset in setting of suicide attempt/ transient LOC/crack cocaine inhalation.  Suspect acute infectious process with the surrounding ground glass / thick-walled nature of the cavity.  However, TB and / or malignancy also in the differential.   Plan: Rocephin/Flagyl 9/16 >> 9/20  Augmentin 9/20 >>  Sputum AFB 9/19 >> neg for AFB on smear >>  Sputum yeast 9/19 >> neg on smear >>  Quantiferon Gold 9/19 >> negative, reads indeterminate but reflects elevated mitogen value which can be related to immune suppression OK to d/c respiratory isolation Complete 2 weeks of antibiotics and then have follow-up imaging as an outpatient Pulmonary hygiene Intermittent CXR, f/u in am  Minimize sedating medications as able   PCCM will sign off.  Please call back  if new needs arise.  Canary Brim, NP-C [facility] Pgr: [phone] or if no answer [phone] 12/06/2014, 12:38 PM

## 2014-12-07 ENCOUNTER — Inpatient Hospital Stay (HOSPITAL_COMMUNITY): Payer: Commercial Managed Care - HMO

## 2014-12-07 ENCOUNTER — Encounter (HOSPITAL_COMMUNITY): Payer: Self-pay

## 2014-12-07 MED ORDER — AMOXICILLIN-POT CLAVULANATE 875-125 MG PO TABS: 1.0000 | ORAL_TABLET | Freq: Two times a day (BID) | ORAL | Status: DC

## 2014-12-07 MED ORDER — POTASSIUM CHLORIDE CRYS ER 20 MEQ PO TBCR
40.0000 meq | EXTENDED_RELEASE_TABLET | Freq: Once | ORAL | Status: AC
  Administered 2014-12-07: 40 meq via ORAL
  Filled 2014-12-07: qty 2

## 2014-12-07 NOTE — Evaluation (Signed)
Physical Therapy One Time Evaluation Patient Details Name: Summer Estrada MRN: [id number] DOB: 1960-12-10 Today's Date: 12/07/2014   History of Present Illness  54 yo female with hx of cigarette use, crack cocaine abuse, L THA, depression, anxiety admitted 9/16 with fever and cough of questionable duration. Pt also with suicide attempt and initially admitted to [facility] and found to have necrotizing process in LUL apical segment  Clinical Impression  Patient evaluated by Physical Therapy with no further acute PT needs identified. All education has been completed and the patient has no further questions.  See below for any follow-up Physical Therapy or equipment needs. PT is signing off. Thank you for this referral. Pt mobilizing close to her baseline, a little slower then normal.  Pt's SpO2 on room air >95% during entire session so left off O2 [place] (NT aware).  Pt had no further questions and agreeable to d/c from acute PT.      Follow Up Recommendations No PT follow up    Equipment Recommendations  None recommended by PT    Recommendations for Other Services       Precautions / Restrictions Precautions Precautions: Fall Restrictions Weight Bearing Restrictions: No      Mobility  Bed Mobility Overal bed mobility: Modified Independent                Transfers Overall transfer level: Modified independent                  Ambulation/Gait Ambulation/Gait assistance: Supervision Ambulation Distance (Feet): 160 Feet Assistive device: Straight cane Gait Pattern/deviations: Antalgic;Step-through pattern     General Gait Details: 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, reports mild SOB, SpO2 >95% room air during gait and upon returning to room  Stairs            Wheelchair Mobility    Modified Rankin (Stroke Patients Only)       Balance Overall balance assessment:  (pt reports no falls if using SPC)                                            Pertinent Vitals/Pain Pain Assessment: 0-10 Pain Score:  (not rated) Pain Location: chest, c/o coughing Pain Descriptors / Indicators: Sore Pain Intervention(s): Limited activity within patient's tolerance;Premedicated before session;Repositioned    Home Living Family/patient expects to be discharged to:: Shelter/Homeless Living Arrangements: Alone               Additional Comments: says she has no home, per chart review pt admitted from [facility], CSW reports working on homeless shelter upon d/c    Prior Function Level of Independence: Independent with assistive device(s)         Comments: uses SPC     Hand Dominance        Extremity/Trunk Assessment   Upper Extremity Assessment: Overall WFL for tasks assessed           Lower Extremity Assessment: Overall WFL for tasks assessed      Cervical / Trunk Assessment: Normal  Communication   Communication: No difficulties  Cognition Arousal/Alertness: Awake/alert Behavior During Therapy: WFL for tasks assessed/performed Overall Cognitive Status: Within Functional Limits for tasks assessed                      General Comments      Exercises  Assessment/Plan    PT Assessment Patent does not need any further PT services  PT Diagnosis     PT Problem List    PT Treatment Interventions     PT Goals (Current goals can be found in the Care Plan section) Acute Rehab PT Goals PT Goal Formulation: All assessment and education complete, DC therapy    Frequency     Barriers to discharge        Co-evaluation               End of Session   Activity Tolerance: Patient tolerated treatment well Patient left: in bed;with call bell/phone within reach;Other (comment) Environmental consultant) Nurse Communication:  (NT aware pt did well with ambulation, left off O2 [place])         Time: 1610-9604 PT Time Calculation (min) (ACUTE ONLY): 10 min   Charges:   PT Evaluation $Initial PT Evaluation Tier  I: 1 Procedure     PT G Codes:        Renne Cornick,KATHrine E 12/07/2014, 12:14 PM Zenovia Jarred, PT, DPT 12/07/2014 Pager: [phone]

## 2014-12-07 NOTE — Discharge Summary (Signed)
Physician Discharge Summary  Summer Estrada:[id number] DOB: [birth year] DOA: 12/01/2014  PCP: Rudi Heap, MD  Admit date: 12/01/2014 Discharge date: 12/07/2014  Time spent: > 35 minutes  Recommendations for Outpatient Follow-up:  1. Please monitor K levels 2. Ensure patient has f/u with pulmonologist in 2 weeks  Discharge Diagnoses:  Principal Problem:   Sepsis due to undetermined organism with acute respiratory failure Active Problems:   Hypokalemia   Anxiety and depression   Depression, major, recurrent, moderate   Left upper lobe pneumonia   Suicidal intent   Insomnia   Smoker   GERD (gastroesophageal reflux disease)   Hyperlipidemia   Cocaine abuse, continuous use   Cocaine dependence with cocaine-induced mood disorder   Necrotizing pneumonia   Discharge Condition: stable  Diet recommendation: heart healthy diet  Filed Weights   12/02/14 0500 12/04/14 1524  Weight: 74 kg (163 lb 2.3 oz) 68.04 kg (150 lb)    History of present illness:  From original HPI: 54 y.o. female with a past medical history significant for recent recurrent suicide attempt 1 week ago with sleeping pills who has been admitted to [facility] since then, as well as history of depression, anxiety, GERD and chronic pain who presents with progressive chest discomfort, cough, and fevers.  Hospital Course:  Principal Problem:  Acute respiratory failure secondary to necrotizing PNA LUL apically  - ? Onset in setting of suicide attempt/ transient LOC/crack cocaine inhalation - continued Rocephin/Flagyl 9/16 - 9/20 and transitioned to oral Augmentin 9/20 per PCCM. Patient per their note will require 14 days of antibiotic therapy and f/u with Dr. Sherene Sires in 2 wks  - sputum AFB negative, quantiferon gold reported no TB Ag   Cocaine dependence with cocaine-induced mood disorder - Will recommend cessation. - Case manager has provided information to assist patient in abstaining   Active  Problems:  Sepsis secondary to necrotizing LUL PNA, Klebsiella UTI - for LUL PNA, Rocephin and flagyl changed to Augmentin on Sept 20th as noted above  - for Klebsiella UTI, today is day #6 Patient will be discharged with 2 wks worth of antibiotics as recommended by pulmonology   GERD (gastroesophageal reflux disease) - stable, may continue PPI as outpatient    Depression, major, recurrent, moderate with anxiety and insomnia  - appreciate psych following  - Patient reevaluated by psychiatry and plan will be for patient to be referred to psychiatric social services for residential substance abuse treatment.    HLD - continue statin    Anemia of chronic disease, IDA - no signs of active bleeding - Hg remains ~ 9   Tobacco abuse - recommended cessation  Procedures:  None  Consultations:  Psychiatry  Discharge Exam: Filed Vitals:   12/07/14 0506  BP: 135/79  Pulse: 81  Temp: 98.1 F (36.7 C)  Resp: 20    General: Pt in nad, alert and awake Cardiovascular: rrr, no mrg Respiratory: cta bl, no wheezes, no increased wob, equal chest rise.  Discharge Instructions   Discharge Instructions    Call MD for:  difficulty breathing, headache or visual disturbances    Complete by:  As directed      Call MD for:  temperature >100.4    Complete by:  As directed      Diet - low sodium heart healthy    Complete by:  As directed      Discharge instructions    Complete by:  As directed   Please follow up with Dr. Sherene Sires  in 2 weeks.     Increase activity slowly    Complete by:  As directed           Current Discharge Medication List    START taking these medications   Details  amoxicillin-clavulanate (AUGMENTIN) 875-125 MG per tablet Take 1 tablet by mouth every 12 (twelve) hours. Qty: 28 tablet, Refills: 0      CONTINUE these medications which have NOT CHANGED   Details  aspirin EC 81 MG tablet Take 81 mg by mouth daily.    atorvastatin (LIPITOR) 20 MG  tablet TAKE ONE TABLET BY MOUTH EVERY DAY Qty: 30 tablet, Refills: 3    buPROPion (WELLBUTRIN) 100 MG tablet Take 100 mg by mouth 3 (three) times daily.    gabapentin (NEURONTIN) 300 MG capsule TAKE 2 CAPSULES BY MOUTH 3 TIMES DAILY Qty: 180 capsule, Refills: 0    meloxicam (MOBIC) 15 MG tablet TAKE 1 TABLET BY MOUTH EVERY DAY Qty: 30 tablet, Refills: 3    ranitidine (ZANTAC) 150 MG tablet Take 150 mg by mouth daily.    traZODone (DESYREL) 50 MG tablet TAKE 1 TABLET BY MOUTH EVERY NIGHT AT BEDTIME Qty: 30 tablet, Refills: 2    carisoprodol (SOMA) 350 MG tablet TAKE ONE TABLET BY MOUTH EVERY 8 HOURS AS NEEDED Qty: 60 tablet, Refills: 2       Allergies  Allergen Reactions  . Codeine Nausea And Vomiting and Rash    Rash all over body; severe nausea and vomiting.      The results of significant diagnostics from this hospitalization (including imaging, microbiology, ancillary and laboratory) are listed below for reference.    Significant Diagnostic Studies: Dg Chest 2 View  12/07/2014   CLINICAL DATA:  Follow-up cavitary pneumonia  EXAM: CHEST  2 VIEW  COMPARISON:  12/01/2014  FINDINGS: Cardiomediastinal silhouette is stable. Persistent cavitary infiltrate in left upper lobe. No pulmonary edema.  IMPRESSION: Persistent cavitary infiltrate in left upper lobe. No pulmonary edema.   Electronically Signed   By: Natasha Mead M.D.   On: 12/07/2014 08:17   Dg Chest 2 View  12/01/2014   CLINICAL DATA:  Chest pain, bilateral flank pain  EXAM: CHEST  2 VIEW  COMPARISON:  None.  FINDINGS: Cardiac size is unremarkable. No pulmonary edema. There is infiltrate or infiltrative process in the left upper lobe suprahilar region medially. Further correlation with enhanced CT scan of the chest is recommended.  IMPRESSION: Infiltrate or infiltrative process in left upper lobe suprahilar region. Further evaluation with enhanced CT scan of the chest is recommended.   Electronically Signed   By: Natasha Mead M.D.    On: 12/01/2014 17:32   Ct Chest W Contrast  12/01/2014   CLINICAL DATA:  Chest pain and bilateral flank pain. Abnormal chest x-ray.  EXAM: CT CHEST, ABDOMEN, AND PELVIS WITH CONTRAST  TECHNIQUE: Multidetector CT imaging of the chest, abdomen and pelvis was performed following the standard protocol during bolus administration of intravenous contrast.  CONTRAST:  25mL OMNIPAQUE IOHEXOL 300 MG/ML SOLN, OMNIPAQUE IOHEXOL 300 MG/ML SOLN  COMPARISON:  Chest x-ray, same date.  FINDINGS: CT CHEST FINDINGS  Chest wall: No breast masses, supraclavicular or axillary adenopathy. Asymmetric breast tissue noted on the left. Nodular thyroid goiter noted. The bony thorax is intact. No destructive bone lesions or spinal canal compromise.  Mediastinum: The heart is normal in size. No pericardial effusion. Scattered mediastinal lymph nodes. 6 mm lymph node located between the left brachiocephalic vein and the left  carotid artery on image 12. 7 mm prevascular lymph node on image number 16. There are smaller scattered pretracheal and aortic O pulmonary window nodes.  The aorta is normal in caliber. No dissection. The esophagus is grossly normal.  Lungs/ pleura: Cavitary left upper lobe lung process is most likely pneumonia with a lung abscess. There is significant surrounding interstitial change in the left upper lobe. Somewhat patchy rounded airspace opacity in the left upper lobe on image 21 is also likely pneumonia and less likely an nodule. The remainder of the lungs are clear. No pulmonary nodules or pleural effusion. There are underlying emphysematous changes.  CT ABDOMEN AND PELVIS FINDINGS  Hepatobiliary: No focal hepatic lesions or intrahepatic biliary dilatation. The gallbladder demonstrates a calcified gallstone. No findings for acute cholecystitis. No common bile duct dilatation.  Pancreas: No mass, inflammation or ductal dilatation.  Spleen: Normal size.  No focal lesions.  Adrenals/Urinary Tract: The adrenal  glands and kidneys are unremarkable. There are scarring changes involving the upper pole region of the left kidney.  Stomach/Bowel: The stomach, duodenum, small bowel and colon are unremarkable. No inflammatory changes, mass lesions or obstructive findings. The terminal ileum is normal. The appendix is normal.  Vascular/Lymphatic: No mesenteric or retroperitoneal mass or adenopathy. The aorta is normal in caliber. Moderate scattered atherosclerotic calcifications. The branch vessels are patent. The major venous structures are patent.  Other: The uterus and ovaries are unremarkable. A small calcified fibroid is noted. The bladder is normal. No pelvic mass, adenopathy or free pelvic fluid collections. No inguinal mass or adenopathy.  Musculoskeletal: No significant bony findings. A left hip prosthesis is noted.  IMPRESSION: Extensive necrotizing left upper lobe pneumonia with cavitations/lung abscesses. Adjacent inflammatory mediastinal lymph nodes.  Mild underlying emphysematous changes.  No acute abdominal/ pelvic findings, mass lesions or adenopathy.  Cholelithiasis.   Electronically Signed   By: Rudie Meyer M.D.   On: 12/01/2014 18:20   Ct Abdomen Pelvis W Contrast  12/01/2014   CLINICAL DATA:  Chest pain and bilateral flank pain. Abnormal chest x-ray.  EXAM: CT CHEST, ABDOMEN, AND PELVIS WITH CONTRAST  TECHNIQUE: Multidetector CT imaging of the chest, abdomen and pelvis was performed following the standard protocol during bolus administration of intravenous contrast.  CONTRAST:  25mL OMNIPAQUE IOHEXOL 300 MG/ML SOLN, OMNIPAQUE IOHEXOL 300 MG/ML SOLN  COMPARISON:  Chest x-ray, same date.  FINDINGS: CT CHEST FINDINGS  Chest wall: No breast masses, supraclavicular or axillary adenopathy. Asymmetric breast tissue noted on the left. Nodular thyroid goiter noted. The bony thorax is intact. No destructive bone lesions or spinal canal compromise.  Mediastinum: The heart is normal in size. No pericardial  effusion. Scattered mediastinal lymph nodes. 6 mm lymph node located between the left brachiocephalic vein and the left carotid artery on image 12. 7 mm prevascular lymph node on image number 16. There are smaller scattered pretracheal and aortic O pulmonary window nodes.  The aorta is normal in caliber. No dissection. The esophagus is grossly normal.  Lungs/ pleura: Cavitary left upper lobe lung process is most likely pneumonia with a lung abscess. There is significant surrounding interstitial change in the left upper lobe. Somewhat patchy rounded airspace opacity in the left upper lobe on image 21 is also likely pneumonia and less likely an nodule. The remainder of the lungs are clear. No pulmonary nodules or pleural effusion. There are underlying emphysematous changes.  CT ABDOMEN AND PELVIS FINDINGS  Hepatobiliary: No focal hepatic lesions or intrahepatic biliary dilatation. The gallbladder  demonstrates a calcified gallstone. No findings for acute cholecystitis. No common bile duct dilatation.  Pancreas: No mass, inflammation or ductal dilatation.  Spleen: Normal size.  No focal lesions.  Adrenals/Urinary Tract: The adrenal glands and kidneys are unremarkable. There are scarring changes involving the upper pole region of the left kidney.  Stomach/Bowel: The stomach, duodenum, small bowel and colon are unremarkable. No inflammatory changes, mass lesions or obstructive findings. The terminal ileum is normal. The appendix is normal.  Vascular/Lymphatic: No mesenteric or retroperitoneal mass or adenopathy. The aorta is normal in caliber. Moderate scattered atherosclerotic calcifications. The branch vessels are patent. The major venous structures are patent.  Other: The uterus and ovaries are unremarkable. A small calcified fibroid is noted. The bladder is normal. No pelvic mass, adenopathy or free pelvic fluid collections. No inguinal mass or adenopathy.  Musculoskeletal: No significant bony findings. A left hip  prosthesis is noted.  IMPRESSION: Extensive necrotizing left upper lobe pneumonia with cavitations/lung abscesses. Adjacent inflammatory mediastinal lymph nodes.  Mild underlying emphysematous changes.  No acute abdominal/ pelvic findings, mass lesions or adenopathy.  Cholelithiasis.   Electronically Signed   By: Rudie Meyer M.D.   On: 12/01/2014 18:20    Microbiology: Recent Results (from the past 240 hour(s))  Urine culture     Status: None   Collection Time: 12/01/14  4:06 PM  Result Value Ref Range Status   Specimen Description URINE, RANDOM  Final   Special Requests NONE  Final   Culture   Final    80,000 COLONIES/ml KLEBSIELLA PNEUMONIAE Performed at [facility]    Report Status 12/04/2014 FINAL  Final   Organism ID, Bacteria KLEBSIELLA PNEUMONIAE  Final      Susceptibility   Klebsiella pneumoniae - MIC*    AMPICILLIN >=32 RESISTANT Resistant     CEFAZOLIN <=4 SENSITIVE Sensitive     CEFTRIAXONE <=1 SENSITIVE Sensitive     CIPROFLOXACIN 0.5 SENSITIVE Sensitive     GENTAMICIN <=1 SENSITIVE Sensitive     IMIPENEM <=0.25 SENSITIVE Sensitive     NITROFURANTOIN 128 RESISTANT Resistant     TRIMETH/SULFA <=20 SENSITIVE Sensitive     AMPICILLIN/SULBACTAM 8 SENSITIVE Sensitive     PIP/TAZO 8 SENSITIVE Sensitive     * 80,000 COLONIES/ml KLEBSIELLA PNEUMONIAE  Blood Culture (routine x 2)     Status: None   Collection Time: 12/01/14  5:02 PM  Result Value Ref Range Status   Specimen Description BLOOD LEFT HAND  Final   Special Requests BOTTLES DRAWN AEROBIC AND ANAEROBIC 5CC  Final   Culture   Final    NO GROWTH 5 DAYS Performed at [facility]    Report Status 12/06/2014 FINAL  Final  Blood Culture (routine x 2)     Status: None   Collection Time: 12/01/14  5:11 PM  Result Value Ref Range Status   Specimen Description BLOOD RIGHT ANTECUBITAL  Final   Special Requests BOTTLES DRAWN AEROBIC AND ANAEROBIC 5CC  Final   Culture   Final    NO GROWTH 5  DAYS Performed at [facility]    Report Status 12/06/2014 FINAL  Final  Culture, sputum-assessment     Status: None   Collection Time: 12/02/14  9:00 AM  Result Value Ref Range Status   Specimen Description SPUTUM  Final   Special Requests NONE  Final   Sputum evaluation   Final    THIS SPECIMEN IS ACCEPTABLE. RESPIRATORY CULTURE REPORT TO FOLLOW.   Report Status 12/02/2014  FINAL  Final  Culture, respiratory (NON-Expectorated)     Status: None   Collection Time: 12/02/14  9:00 AM  Result Value Ref Range Status   Specimen Description SPUTUM  Final   Special Requests NONE  Final   Gram Stain   Final    ABUNDANT WBC PRESENT, PREDOMINANTLY PMN NO SQUAMOUS EPITHELIAL CELLS SEEN RARE GRAM POSITIVE COCCI IN CLUSTERS Performed at [facility]    Culture   Final    NORMAL OROPHARYNGEAL FLORA Performed at [facility]    Report Status 12/04/2014 FINAL  Final  AFB culture with smear     Status: None (Preliminary result)   Collection Time: 12/04/14  2:46 PM  Result Value Ref Range Status   Specimen Description SPUTUM  Final   Special Requests Normal  Final   Acid Fast Smear   Final    NO ACID FAST BACILLI SEEN Performed at [facility]    Culture   Final    CULTURE WILL BE EXAMINED FOR 6 WEEKS BEFORE ISSUING A FINAL REPORT Performed at [facility]    Report Status PENDING  Incomplete  Fungus Culture with Smear     Status: None (Preliminary result)   Collection Time: 12/04/14  2:48 PM  Result Value Ref Range Status   Specimen Description SPUTUM  Final   Special Requests Normal  Final   Fungal Smear RARE YEAST Performed at [facility]   Final   Culture   Final    CULTURE IN PROGRESS FOR FOUR WEEKS Performed at [facility]    Report Status PENDING  Incomplete  Culture, expectorated sputum-assessment     Status: None   Collection Time: 12/04/14  4:28 PM  Result Value Ref Range Status   Specimen Description SPUTUM   Final   Special Requests Immunocompromised  Final   Sputum evaluation   Final    MICROSCOPIC FINDINGS SUGGEST THAT THIS SPECIMEN IS NOT REPRESENTATIVE OF LOWER RESPIRATORY SECRETIONS. PLEASE RECOLLECT. RESULTS CALLED TO FRED HERNDON,RN [id number] @ 1738 BY J SCOTTON    Report Status 12/04/2014 FINAL  Final     Labs: Basic Metabolic Panel:  Recent Labs Lab 11/30/14 1945 12/01/14 1710 12/02/14 0610 12/03/14 0548 12/06/14 0402  NA 134* 132* 135 136 140  K 4.0 3.9 4.0 3.5 3.2*  CL 94* 95* 102 100* 103  CO2 29 29 26 26 29   GLUCOSE 148* 110* 103* 101* 94  BUN 10 11 8  5* <5*  CREATININE 0.76 0.57 0.49 0.56 0.45  CALCIUM 9.0 8.2* 8.3* 8.7* 8.5*   Liver Function Tests:  Recent Labs Lab 11/30/14 1945 12/01/14 1710  AST 29 33  ALT 28 30  ALKPHOS 85 76  BILITOT 0.5 0.5  PROT 7.2 6.3*  ALBUMIN 3.0* 2.5*    Recent Labs Lab 12/01/14 1710  LIPASE 12*   No results for input(s): AMMONIA in the last 168 hours. CBC:  Recent Labs Lab 11/30/14 1945 12/01/14 1710 12/03/14 0548 12/06/14 0402  WBC 20.0* 20.1* 15.5* 8.8  NEUTROABS 15.9* 16.2*  --   --   HGB 11.3* 9.5* 9.9* 9.5*  HCT 35.2* 29.8* 31.7* 30.5*  MCV 82.6 82.3 82.3 82.4  PLT 342 270 285 375   Cardiac Enzymes:  Recent Labs Lab 12/01/14 1710  TROPONINI <0.03   BNP: BNP (last 3 results) No results for input(s): BNP in the last 8760 hours.  ProBNP (last 3 results) No results for input(s): PROBNP in the last 8760 hours.  CBG: No results for input(s): GLUCAP in the last 168 hours.  Signed:  Penny Pia  [facility] 12/07/2014, 2:06 PM

## 2014-12-07 NOTE — Progress Notes (Signed)
Per attending and psychiatrist, patient psychiatrically stable for discharge. Pt interested in substance abuse residential program. CSW and intern met with patient at bedside and provided resources.  Patient voiced concern that she has no where to go, and her brother was going out of town and would not be allowed to stay there alone. CSW and pt discussed homeless shelter information, including going to the [facility], the shelter hub for placement. Patient stated that she did not understand why she could not be placed due to her being on disability. CSW and pt discussed resources available. Pt was not recommended for any placement per physical therapy.  Pt voiced that she was expecting to go straight from hosptial to substnace abuse treatment because she has no where to stay. CSW discussed and explained process. Pt requesting information for housing colation. CSW provided patient with information and encourage pt to follow up at the [facility] and the Path program.   Summer Estrada, [place] Work  [employer] [phone]

## 2014-12-15 NOTE — Discharge Summary (Signed)
  Patient ID: Summer Estrada, female DOB: 24-Sep-1960, 54 y.o. MRN: [id number]    Per Progress Note Pripr To Discharge:  Patient states that she has not been feeling well "It burns and hurts when I pee; it is dark and stinks. The odor was so bad yesterday I had to take a shower. Patient also complains of frequency. Patient is laying in bed. States that she has not been able to get out of bed or go to the cafeteria because of he back and hip pain.   Patient was treated for urinary tract infection 11/23/17 with Macrodantin but symptoms has gotten worsen. Since treatment there has also change in lab   CBC Latest Ref Rng 11/30/2014 11/23/2014 11/26/2012  WBC 4.0 - 10.5 K/uL 20.0(H) 16.9(H) 8.1  Hemoglobin 12.0 - 15.0 g/dL 11.3(L) 11.6(L) 13.8  Hematocrit 36.0 - 46.0 % 35.2(L) 36.0 41.5  Platelets 150 - 400 K/uL 342 205 -    Urinalysis  Labs (Brief)       Component Value Date/Time   COLORURINE AMBER* 11/30/2014 1019   APPEARANCEUR CLOUDY* 11/30/2014 1019   LABSPEC 1.014 11/30/2014 1019   PHURINE 6.0 11/30/2014 1019   GLUCOSEU NEGATIVE 11/30/2014 1019   HGBUR SMALL* 11/30/2014 1019   BILIRUBINUR NEGATIVE 11/30/2014 1019   BILIRUBINUR neg 04/21/2013 1623   KETONESUR 40* 11/30/2014 1019   PROTEINUR 30* 11/30/2014 1019   PROTEINUR neg 04/21/2013 1623   UROBILINOGEN 4.0* 11/30/2014 1019   UROBILINOGEN negative 04/21/2013 1623   NITRITE POSITIVE* 11/30/2014 1019   NITRITE neg 04/21/2013 1623   LEUKOCYTESUR TRACE* 11/30/2014 1019      Psychiatric Specialty Exam: Physical Exam  Constitutional: She is oriented to person, place, and time.  Neck: Normal range of motion.  Respiratory: Effort normal.  GI: She exhibits no distension. There is CVA tenderness.  Musculoskeletal: Normal range of motion.  Neurological: She is alert and oriented to person, place, and time.    Review of Systems   Constitutional: Positive for malaise/fatigue and diaphoresis.  Gastrointestinal: Positive for abdominal pain.  Genitourinary: Positive for dysuria, frequency and flank pain.  Musculoskeletal: Positive for back pain.    Blood pressure 100/65, pulse 99, temperature 99.5 F (37.5 C), temperature source Oral, resp. rate 16, height  (1.6 m), weight 75.297 kg (166 lb), SpO2 90 %.Body mass index is 29.41 kg/(m^2).   Will order Levaquin 750 mg daily for 5 days for complicated UTI  Addendum: Instead of starting antibiotic at this time will send patient to ED for evaluation/treatment and IV fluids.   Disposition:  Patient discharged related to being admitted to [facility].    [place] B. Rankin FNP-BC 12/01/2014         I personally assessed the patient and formulated the plan Madie [place] A. Dub Mikes, M.D.

## 2014-12-22 ENCOUNTER — Ambulatory Visit (INDEPENDENT_AMBULATORY_CARE_PROVIDER_SITE_OTHER): Payer: Medicare Other | Admitting: Internal Medicine

## 2014-12-22 ENCOUNTER — Encounter: Payer: Self-pay | Admitting: Internal Medicine

## 2014-12-22 VITALS — BP 90/60 | HR 70 | Temp 97.0°F | Ht 63.0 in | Wt 158.0 lb

## 2014-12-22 DIAGNOSIS — F17210 Nicotine dependence, cigarettes, uncomplicated: Secondary | ICD-10-CM | POA: Diagnosis not present

## 2014-12-22 DIAGNOSIS — Z720 Tobacco use: Secondary | ICD-10-CM

## 2014-12-22 DIAGNOSIS — J850 Gangrene and necrosis of lung: Secondary | ICD-10-CM

## 2014-12-22 MED ORDER — FLUTTER DEVI: Status: DC

## 2014-12-22 NOTE — Patient Instructions (Addendum)
Ranitidine should be after breakfast and after supper   GERD (REFLUX)  is an extremely common cause of respiratory symptoms just like yours , many times with no obvious heartburn at all.    It can be treated with medication, but also with lifestyle changes including elevation of the head of your bed (ideally with 6 inch  bed blocks),  Smoking cessation, avoidance of late meals, excessive alcohol, and avoid fatty foods, chocolate, peppermint, colas, red wine, and acidic juices such as orange juice.  NO MINT OR MENTHOL PRODUCTS SO NO COUGH DROPS  USE SUGARLESS CANDY INSTEAD (Jolley ranchers or Stover's or Life Savers) or even ice chips will also do - the key is to swallow to prevent all throat clearing. NO OIL BASED VITAMINS - use powdered substitutes.    Augmentin 875 mg take one pill twice daily -  take at breakfast and supper with large glass of water.  It would help reduce the usual side effects (diarrhea and yeast infections) if you ate cultured yogurt at lunch.   The key is to stop smoking completely before smoking completely stops you!   See your dentist as soon as possible and try to sleep right side down   Best cough medication = mucinex dm 1200 mg every 12 hours and use flutter valve as much as possible   Please schedule a follow up office visit in 2 weeks, sooner if needed with cxr on return

## 2014-12-22 NOTE — Progress Notes (Signed)
Subjective:     Patient ID: Summer Estrada, female   DOB: 11/30/1960,    MRN: [id number]  HPI  46 yowf active smoker normally limited more by arthritis than breathing and no resp meds chronically  > suicide attermpt /od > one week later admitted:  Admit date: 12/01/2014 Discharge date: 12/07/2014  Discharge Diagnoses:  Principal Problem:  Sepsis due to undetermined organism with acute respiratory failure   Hypokalemia  Anxiety and depression  Depression, major, recurrent, moderate  Left upper lobe pneumonia  Suicidal intent  Insomnia  Smoker  GERD (gastroesophageal reflux disease)  Hyperlipidemia  Cocaine abuse, continuous use  Cocaine dependence with cocaine-induced mood disorder  Necrotizing pneumonia   Filed Weights   12/02/14 0500 12/04/14 1524  Weight: 74 kg (163 lb 2.3 oz) 68.04 kg (150 lb)    History of present illness:  From original HPI: 54 y.o. female with a past medical history significant for recent recurrent suicide attempt 1 week ago with sleeping pills who has been admitted to [facility] since then, as well as history of depression, anxiety, GERD and chronic pain who presents with progressive chest discomfort, cough, and fevers.  Hospital Course:  Principal Problem:  Acute respiratory failure secondary to necrotizing PNA LUL apically  - ? Onset in setting of suicide attempt/ transient LOC/crack cocaine inhalation - continued Rocephin/Flagyl 9/16 - 9/20 and transitioned to oral Augmentin 9/20 per PCCM. Patient per their note will require 14 days of antibiotic therapy and f/u with Dr. Sherene Sires in 2 wks  - sputum AFB negative, quantiferon gold reported no TB Ag   Cocaine dependence with cocaine-induced mood disorder - Will recommend cessation. - Case manager has provided information to assist patient in abstaining   Active Problems:  Sepsis secondary to necrotizing LUL PNA, Klebsiella UTI - for LUL PNA, Rocephin and flagyl  changed to Augmentin on Sept 20th as noted above  - for Klebsiella UTI, today is day #6 Patient will be discharged with 2 wks worth of antibiotics as recommended by pulmonology   GERD (gastroesophageal reflux disease) - stable, may continue PPI as outpatient    Depression, major, recurrent, moderate with anxiety and insomnia  - appreciate psych following  - Patient reevaluated by psychiatry and plan will be for patient to be referred to psychiatric social services for residential substance abuse treatment.    HLD - continue statin    Anemia of chronic disease, IDA - no signs of active bleeding - Hg remains ~ 9   Tobacco abuse - recommended cessation  Procedures:  None  Consultations:  Psychiatry          12/22/2014 1st [facility] office visit/extended post hosp eval /  Summer Estrada   Chief Complaint  Patient presents with  . HFU    Pt states "I feel just as bad as I did when I was in the hospital"- still has cough- occ prod with min light green sputum.  She states that she feels SOB "all the time"  acutely ill for only a few days prior to coughing up green/bloody mucus with  some fever acutely  Better by d/c   on augmentin x14 days still on it at ov  as late getting rx filled and only taking once a day.  Mucus is light green now, no blood, no fever, lots of chest congestion and "wheezing"/doe  but comfortable at rest s inhalers/ does have overt hb and out of zantac at present.   No obvious day to day  or daytime variability or assoc  cp or chest tightness,   overt sinus or hb symptoms. No unusual exp hx or h/o childhood pna/ asthma or knowledge of premature birth.  Sleeping ok without nocturnal  or early am exacerbation  of respiratory  c/o's or need for noct saba. Also denies any obvious fluctuation of symptoms with weather or environmental changes or other aggravating or alleviating factors except as outlined above   Current Medications, Allergies, Complete Past  Medical History, Past Surgical History, Family History, and Social History were reviewed in [employer] record.  ROS  The following are not active complaints unless bolded sore throat, dysphagia, dental problems, itching, sneezing,  nasal congestion or excess/ purulent secretions, ear ache,   fever, chills, sweats, unintended wt loss, classically pleuritic or exertional cp, hemoptysis,  orthopnea pnd or leg swelling, presyncope, palpitations, abdominal pain, anorexia, nausea, vomiting, diarrhea  or change in bowel or bladder habits, change in stools or urine, dysuria,hematuria,  rash, arthralgias, visual complaints, headache, numbness, weakness or ataxia or problems with walking or coordination,  change in mood/affect or memory.           Review of Systems     Objective:   Physical Exam   amb wf nad congested cough prominent pseudowheeze eliminated with plm   Wt Readings from Last 3 Encounters:  12/22/14 158 lb (71.668 kg)  12/04/14 150 lb (68.04 kg)  12/01/14 150 lb (68.04 kg)    Vital signs reviewed    HEENT: top denture/ very poor dentition L lower molar "oyster"  With nl turbinates, and orophanx. Nl external ear canals without cough reflex   NECK :  without JVD/Nodes/TM/ nl carotid upstrokes bilaterally   LUNGS: no acc muscle use, clear to A and P bilaterally without cough on insp or exp maneuvers   CV:  RRR  no s3 or murmur or increase in P2, no edema   ABD:  soft and nontender with nl excursion in the supine position. No bruits or organomegaly, bowel sounds nl  MS:  warm without deformities, calf tenderness, cyanosis or clubbing  SKIN: warm and dry without lesions    NEURO:  alert, approp, no deficits      I personally reviewed images and agree with radiology impression as follows:  CXR:   12/07/14 Persistent cavitary infiltrate in left upper lobe. No pulmonary Edema.      Assessment:

## 2014-12-23 LAB — FUNGUS CULTURE W SMEAR: SPECIAL REQUESTS: NORMAL

## 2014-12-23 NOTE — Assessment & Plan Note (Signed)

## 2014-12-23 NOTE — Assessment & Plan Note (Addendum)
Acute illness p apparent suicide attempt/OD assoc with very poor dentition   Despite the chronology of this illness and associated extremely poor dentition the location of the necrotizing process is unusual and that is very medial knee and anterior in the LEft upper lobe - it should be noted that about 16% of lung abscesses actually turned out to be lung cancers and she is at risk from smoking. Therefore will need to follow this area very closely serially in meantime strongly rec dental eval asap  She actually misunderstood the instruction she received on how to take Augmentin and I reviewed these carefully with her today along with that in a flutter valve to help her with cough mechanics  I had an extended discussion with the patient reviewing all relevant studies completed to date and  lasting  25 m of 40 min ov   Each maintenance medication was reviewed in detail including most importantly the difference between maintenance and prns and under what circumstances the prns are to be triggered using an action plan format that is not reflected in the computer generated alphabetically organized AVS.    Please see instructions for details which were reviewed in writing and the patient given a copy highlighting the part that I personally wrote and discussed at today's ov.

## 2015-01-05 ENCOUNTER — Other Ambulatory Visit: Payer: Self-pay | Admitting: *Deleted

## 2015-01-05 ENCOUNTER — Ambulatory Visit: Payer: Self-pay | Admitting: Internal Medicine

## 2015-01-15 ENCOUNTER — Ambulatory Visit (INDEPENDENT_AMBULATORY_CARE_PROVIDER_SITE_OTHER): Payer: Medicare Other | Admitting: Family Medicine

## 2015-01-15 ENCOUNTER — Encounter: Payer: Self-pay | Admitting: Family Medicine

## 2015-01-15 VITALS — BP 101/68 | HR 74 | Temp 97.9°F | Ht 63.0 in | Wt 153.0 lb

## 2015-01-15 DIAGNOSIS — G4700 Insomnia, unspecified: Secondary | ICD-10-CM | POA: Diagnosis not present

## 2015-01-15 DIAGNOSIS — F1424 Cocaine dependence with cocaine-induced mood disorder: Secondary | ICD-10-CM

## 2015-01-15 DIAGNOSIS — F331 Major depressive disorder, recurrent, moderate: Secondary | ICD-10-CM

## 2015-01-15 DIAGNOSIS — E785 Hyperlipidemia, unspecified: Secondary | ICD-10-CM | POA: Diagnosis not present

## 2015-01-15 DIAGNOSIS — R45851 Suicidal ideations: Secondary | ICD-10-CM

## 2015-01-15 MED ORDER — DULOXETINE HCL 60 MG PO CPEP: 60.0000 mg | ORAL_CAPSULE | Freq: Every day | ORAL | Status: DC

## 2015-01-15 MED ORDER — CARISOPRODOL 350 MG PO TABS: 350.0000 mg | ORAL_TABLET | Freq: Three times a day (TID) | ORAL | Status: DC | PRN

## 2015-01-15 MED ORDER — MELOXICAM 15 MG PO TABS: 15.0000 mg | ORAL_TABLET | Freq: Every day | ORAL | Status: DC

## 2015-01-15 MED ORDER — TRAZODONE HCL 50 MG PO TABS: 50.0000 mg | ORAL_TABLET | Freq: Every day | ORAL | Status: DC

## 2015-01-15 MED ORDER — ATORVASTATIN CALCIUM 20 MG PO TABS: 20.0000 mg | ORAL_TABLET | Freq: Every day | ORAL | Status: DC

## 2015-01-15 MED ORDER — GABAPENTIN 300 MG PO CAPS: ORAL_CAPSULE | ORAL | Status: DC

## 2015-01-15 NOTE — Progress Notes (Signed)
   HPI  Patient presents today here for medication refill and hospital follow-up  She was admitted in September for suicidal ideation and CAP She denies suicidal ideation at this time, she is breathing better.   She needs refills on all of her medications. She uses soma, she states that it helps her very much with her musculoskeletal movements, she states that Flexeril and Robaxin did not help. She plans to change doctors as she is moving to [place] within the next week.  She uses mobic daily for musculoskeletal pain, she has a pain management clinic referral that has been placed and is being arranged  Insomnia- helped quite a bit by trazodone  She states that she has a difficult time with anxiety and depression and would like a refill on Wellbutrin. Again, denies SI  PMH: Smoking status noted ROS: Per HPI  Objective: BP 101/68 mmHg  Pulse 74  Temp(Src) 97.9 F (36.6 C) (Oral)  Ht $R'5\' 3"'Ni$  (1.6 m)  Wt 153 lb (69.4 kg)  BMI 27.11 kg/m2 Gen: NAD, alert, cooperatie with exam HEENT: NCAT CV: RRR, good S1/S2, no murmur Resp: CTABL, no wheezes, non-labored Ext: No edema, warm Neuro: Alert and oriented, No gross deficits  Assessment and plan:  # Anxiety, depression, suicidal ideation Denies suicidal ideation today Refilled trazodone for insomnia Changed Wellbutrin to Cymbalta considering chronic musculos-skeletal pain, anxiety, and depression She will establish with a psychiatrist whom I belive will manage this, encouraged f/u in 2 weeks with me or noew provider, she agrees  # Insomnia Refilled trazodone  # Cocaine abuse Abstained now for 2 months, caution with Soma, would not go up on dose or give earrly refills  # Hypokalemia Repeat labs, replace if needed.     Orders Placed This Encounter  Procedures  . CMP14+EGFR  . Lipid Panel  . CBC with Differential    Meds ordered this encounter  Medications  . traZODone (DESYREL) 50 MG tablet    Sig: Take 1 tablet  (50 mg total) by mouth at bedtime.    Dispense:  30 tablet    Refill:  2  . meloxicam (MOBIC) 15 MG tablet    Sig: Take 1 tablet (15 mg total) by mouth daily.    Dispense:  30 tablet    Refill:  3  . gabapentin (NEURONTIN) 300 MG capsule    Sig: TAKE 2 CAPSULES BY MOUTH 3 TIMES DAILY    Dispense:  180 capsule    Refill:  0  . atorvastatin (LIPITOR) 20 MG tablet    Sig: Take 1 tablet (20 mg total) by mouth daily.    Dispense:  30 tablet    Refill:  3  . DULoxetine (CYMBALTA) 60 MG capsule    Sig: Take 1 capsule (60 mg total) by mouth daily.    Dispense:  30 capsule    Refill:  [place], MD [place] 01/15/2015, 11:04 AM

## 2015-01-15 NOTE — Patient Instructions (Signed)
Great to meet you!  You need follow up with a doctor within 2 weeks after starting cymbalta  We will call about your labs within 1 week    Taking the medicine as directed and not missing any doses is one of the best things you can do to treat your depression.  Here are some things to keep in mind:  1) Side effects (stomach upset, some increased anxiety) may happen before you notice a benefit.  These side effects typically go away over time. 2) Changes to your dose of medicine or a change in medication all together is sometimes necessary 3) Most people need to be on medication at least 6-12 months 4) Many people will notice an improvement within two weeks but the full effect of the medication can take up to 4-6 weeks 5) Stopping the medication when you start feeling better often results in a return of symptoms If you start having thoughts of hurting yourself or others after starting this medicine, please seek immediate medical attention or call [phone]

## 2015-01-16 ENCOUNTER — Telehealth: Payer: Self-pay | Admitting: Family Medicine

## 2015-01-16 LAB — LIPID PANEL
CHOLESTEROL TOTAL: 182 mg/dL (ref 100–199)
Chol/HDL Ratio: 3.5 ratio units (ref 0.0–4.4)
HDL: 52 mg/dL (ref >39)
LDL CALC: 104 mg/dL — AB (ref 0–99)
TRIGLYCERIDES: 131 mg/dL (ref 0–149)
VLDL CHOLESTEROL CAL: 26 mg/dL (ref 5–40)

## 2015-01-16 LAB — CMP14+EGFR
ALK PHOS: 58 IU/L (ref 39–117)
ALT: 12 IU/L (ref 0–32)
AST: 13 IU/L (ref 0–40)
Albumin/Globulin Ratio: 1.9 (ref 1.1–2.5)
Albumin: 4.4 g/dL (ref 3.5–5.5)
BILIRUBIN TOTAL: 0.3 mg/dL (ref 0.0–1.2)
BUN/Creatinine Ratio: 18 (ref 9–23)
BUN: 11 mg/dL (ref 6–24)
CHLORIDE: 99 mmol/L (ref 97–106)
CO2: 25 mmol/L (ref 18–29)
CREATININE: 0.62 mg/dL (ref 0.57–1.00)
Calcium: 9.8 mg/dL (ref 8.7–10.2)
GFR calc Af Amer: 118 mL/min/{1.73_m2} (ref >59)
GFR calc non Af Amer: 103 mL/min/{1.73_m2} (ref >59)
GLUCOSE: 86 mg/dL (ref 65–99)
Globulin, Total: 2.3 g/dL (ref 1.5–4.5)
Potassium: 4.5 mmol/L (ref 3.5–5.2)
Sodium: 141 mmol/L (ref 136–144)
Total Protein: 6.7 g/dL (ref 6.0–8.5)

## 2015-01-16 LAB — AFB CULTURE WITH SMEAR (NOT AT ARMC)
ACID FAST SMEAR: NONE SEEN
Special Requests: NORMAL

## 2015-01-16 LAB — CBC WITH DIFFERENTIAL/PLATELET
BASOS: 1 %
Basophils Absolute: 0.1 10*3/uL (ref 0.0–0.2)
EOS (ABSOLUTE): 0.1 10*3/uL (ref 0.0–0.4)
EOS: 2 %
HEMATOCRIT: 37.7 % (ref 34.0–46.6)
Hemoglobin: 11.9 g/dL (ref 11.1–15.9)
IMMATURE GRANULOCYTES: 0 %
Immature Grans (Abs): 0 10*3/uL (ref 0.0–0.1)
LYMPHS ABS: 3.7 10*3/uL — AB (ref 0.7–3.1)
Lymphs: 50 %
MCH: 25.4 pg — AB (ref 26.6–33.0)
MCHC: 31.6 g/dL (ref 31.5–35.7)
MCV: 80 fL (ref 79–97)
MONOS ABS: 0.4 10*3/uL (ref 0.1–0.9)
Monocytes: 5 %
NEUTROS ABS: 3.1 10*3/uL (ref 1.4–7.0)
NEUTROS PCT: 42 %
PLATELETS: 354 10*3/uL (ref 150–379)
RBC: 4.69 x10E6/uL (ref 3.77–5.28)
RDW: 15.7 % — AB (ref 12.3–15.4)
WBC: 7.4 10*3/uL (ref 3.4–10.8)

## 2015-01-16 NOTE — Progress Notes (Signed)
Patient aware.

## 2015-01-17 ENCOUNTER — Emergency Department (HOSPITAL_COMMUNITY): Payer: Medicare Other

## 2015-01-17 ENCOUNTER — Encounter (HOSPITAL_COMMUNITY): Payer: Self-pay

## 2015-01-17 ENCOUNTER — Other Ambulatory Visit: Payer: Self-pay

## 2015-01-17 ENCOUNTER — Emergency Department (HOSPITAL_COMMUNITY)
Admission: EM | Admit: 2015-01-17 | Discharge: 2015-01-18 | Disposition: A | Discharge status: Other Acute Inpatient Hospital | Payer: Medicare Other | Attending: Emergency Medicine | Admitting: Emergency Medicine

## 2015-01-17 DIAGNOSIS — R45851 Suicidal ideations: Secondary | ICD-10-CM

## 2015-01-17 DIAGNOSIS — Z791 Long term (current) use of non-steroidal anti-inflammatories (NSAID): Secondary | ICD-10-CM | POA: Insufficient documentation

## 2015-01-17 DIAGNOSIS — J189 Pneumonia, unspecified organism: Secondary | ICD-10-CM

## 2015-01-17 DIAGNOSIS — K219 Gastro-esophageal reflux disease without esophagitis: Secondary | ICD-10-CM | POA: Insufficient documentation

## 2015-01-17 DIAGNOSIS — F329 Major depressive disorder, single episode, unspecified: Secondary | ICD-10-CM | POA: Insufficient documentation

## 2015-01-17 DIAGNOSIS — Z79899 Other long term (current) drug therapy: Secondary | ICD-10-CM | POA: Diagnosis not present

## 2015-01-17 DIAGNOSIS — E785 Hyperlipidemia, unspecified: Secondary | ICD-10-CM | POA: Diagnosis not present

## 2015-01-17 DIAGNOSIS — G8929 Other chronic pain: Secondary | ICD-10-CM | POA: Diagnosis not present

## 2015-01-17 DIAGNOSIS — F1424 Cocaine dependence with cocaine-induced mood disorder: Secondary | ICD-10-CM | POA: Diagnosis present

## 2015-01-17 DIAGNOSIS — F419 Anxiety disorder, unspecified: Secondary | ICD-10-CM | POA: Diagnosis not present

## 2015-01-17 DIAGNOSIS — F1410 Cocaine abuse, uncomplicated: Secondary | ICD-10-CM | POA: Diagnosis not present

## 2015-01-17 DIAGNOSIS — Z720 Tobacco use: Secondary | ICD-10-CM | POA: Insufficient documentation

## 2015-01-17 DIAGNOSIS — M1990 Unspecified osteoarthritis, unspecified site: Secondary | ICD-10-CM | POA: Diagnosis not present

## 2015-01-17 DIAGNOSIS — J159 Unspecified bacterial pneumonia: Secondary | ICD-10-CM | POA: Insufficient documentation

## 2015-01-17 DIAGNOSIS — Z046 Encounter for general psychiatric examination, requested by authority: Secondary | ICD-10-CM | POA: Diagnosis not present

## 2015-01-17 DIAGNOSIS — Z7982 Long term (current) use of aspirin: Secondary | ICD-10-CM | POA: Insufficient documentation

## 2015-01-17 LAB — COMPREHENSIVE METABOLIC PANEL
ALT: 16 U/L (ref 14–54)
AST: 18 U/L (ref 15–41)
Albumin: 4.1 g/dL (ref 3.5–5.0)
Alkaline Phosphatase: 45 U/L (ref 38–126)
Anion gap: 7 (ref 5–15)
BILIRUBIN TOTAL: 0.7 mg/dL (ref 0.3–1.2)
BUN: 11 mg/dL (ref 6–20)
CO2: 24 mmol/L (ref 22–32)
Calcium: 9.2 mg/dL (ref 8.9–10.3)
Chloride: 104 mmol/L (ref 101–111)
Creatinine, Ser: 0.68 mg/dL (ref 0.44–1.00)
Glucose, Bld: 95 mg/dL (ref 65–99)
POTASSIUM: 3.6 mmol/L (ref 3.5–5.1)
Sodium: 135 mmol/L (ref 135–145)
TOTAL PROTEIN: 7 g/dL (ref 6.5–8.1)

## 2015-01-17 LAB — CBC
HCT: 35.3 % — ABNORMAL LOW (ref 36.0–46.0)
Hemoglobin: 11.5 g/dL — ABNORMAL LOW (ref 12.0–15.0)
MCH: 26.1 pg (ref 26.0–34.0)
MCHC: 32.6 g/dL (ref 30.0–36.0)
MCV: 80.2 fL (ref 78.0–100.0)
PLATELETS: 288 10*3/uL (ref 150–400)
RBC: 4.4 MIL/uL (ref 3.87–5.11)
RDW: 15.5 % (ref 11.5–15.5)
WBC: 9.4 10*3/uL (ref 4.0–10.5)

## 2015-01-17 LAB — RAPID URINE DRUG SCREEN, HOSP PERFORMED
Amphetamines: NOT DETECTED
BENZODIAZEPINES: NOT DETECTED
Barbiturates: NOT DETECTED
Cocaine: POSITIVE — AB
Opiates: NOT DETECTED
Tetrahydrocannabinol: NOT DETECTED

## 2015-01-17 LAB — ETHANOL

## 2015-01-17 LAB — ACETAMINOPHEN LEVEL: Acetaminophen (Tylenol), Serum: <10 ug/mL — ABNORMAL LOW (ref 10–30)

## 2015-01-17 LAB — SALICYLATE LEVEL

## 2015-01-17 MED ORDER — TRAZODONE HCL 50 MG PO TABS
50.0000 mg | ORAL_TABLET | Freq: Every day | ORAL | Status: DC
  Administered 2015-01-17: 50 mg via ORAL
  Filled 2015-01-17: qty 1

## 2015-01-17 MED ORDER — FAMOTIDINE 20 MG PO TABS
20.0000 mg | ORAL_TABLET | Freq: Two times a day (BID) | ORAL | Status: DC
  Administered 2015-01-17 – 2015-01-18 (×2): 20 mg via ORAL
  Filled 2015-01-17 (×2): qty 1

## 2015-01-17 MED ORDER — ADULT MULTIVITAMIN W/MINERALS CH
1.0000 | ORAL_TABLET | Freq: Every day | ORAL | Status: DC
  Administered 2015-01-17 – 2015-01-18 (×2): 1 via ORAL
  Filled 2015-01-17 (×2): qty 1

## 2015-01-17 MED ORDER — DULOXETINE HCL 60 MG PO CPEP
60.0000 mg | ORAL_CAPSULE | Freq: Every day | ORAL | Status: DC
  Administered 2015-01-18: 60 mg via ORAL
  Filled 2015-01-17: qty 1

## 2015-01-17 MED ORDER — NICOTINE 21 MG/24HR TD PT24
21.0000 mg | MEDICATED_PATCH | Freq: Every day | TRANSDERMAL | Status: DC
  Administered 2015-01-17 – 2015-01-18 (×2): 21 mg via TRANSDERMAL
  Filled 2015-01-17 (×2): qty 1

## 2015-01-17 MED ORDER — MELOXICAM 15 MG PO TABS
15.0000 mg | ORAL_TABLET | Freq: Every day | ORAL | Status: DC
  Administered 2015-01-18: 15 mg via ORAL
  Filled 2015-01-17: qty 1

## 2015-01-17 MED ORDER — LEVOFLOXACIN 500 MG PO TABS
500.0000 mg | ORAL_TABLET | Freq: Every day | ORAL | Status: DC
  Administered 2015-01-17 – 2015-01-18 (×2): 500 mg via ORAL
  Filled 2015-01-17 (×2): qty 1

## 2015-01-17 MED ORDER — ASPIRIN EC 81 MG PO TBEC
81.0000 mg | DELAYED_RELEASE_TABLET | Freq: Every day | ORAL | Status: DC
  Administered 2015-01-17 – 2015-01-18 (×2): 81 mg via ORAL
  Filled 2015-01-17 (×3): qty 1

## 2015-01-17 NOTE — ED Notes (Signed)
Poison Control representative called and updates were given on current vital signs and pt's condition. Poison Control representative states that they will be signing off on the patient at this time.

## 2015-01-17 NOTE — ED Notes (Signed)
Contacted Poison Control  Recommendations:  Monitor for a minimum of 6 hours If pt is A&Ox4, VSS, lab work [employer]-move onto next level of care Acetaminophen level now and in 4 hours, UDS, EKG-if abnormal, continue to monitor, electrolytes, check LFTs at 4 hour mark with acetaminophen level  Spoke with: Judeth CornfieldStephanie

## 2015-01-17 NOTE — ED Provider Notes (Signed)
CSN: [id number]     Arrival date & time 01/17/15  1450 History   First MD Initiated Contact with Patient 01/17/15 1503     Chief Complaint  Patient presents with  . Suicidal     (Consider location/radiation/quality/duration/timing/severity/associated sxs/prior Treatment) HPI   54 y.o. Female w/ SI without a plan. Pt denies HI. Pt denies AVH. According to the Pt, she was clean from crack cocaine for a month and then she began to use again this past week. Pt states she is depressed that she is using crack again, and that her daughter recently evicted her. The Pt states "I'm homeless now." The reports 10 years of crack cocaine use. Pt states she uses $100 dollars a day of crack cocaine. Pt reports 5 SI attempts. According to the Pt, [facility] and [facility] for depression and SA. Pt states she just began working with E[facility] Hash[facility]Monica McClain and [facility]. Pt states "If I don't get inpatient long-term care I will kill myself." Pt refuses outpatient care  Past Medical History  Diagnosis Date  . Knee pain   . Chronic pain   . Wears glasses   . Wears dentures     upper  . GERD (gastroesophageal reflux disease)     occ  . Depression   . Arthritis   . Hyperlipidemia   . Anxiety    Past Surgical History  Procedure Laterality Date  . Total hip arthroplasty  2010    lt hip  . Cesarean section      x2  . Tubal ligation     Family History  Problem Relation Age of Onset  . Kidney disease Mother   . Heart disease Father    Social History  Substance Use Topics  . Smoking status: Current Every Day Smoker -- 1.50 packs/day  . Smokeless tobacco: Never Used  . Alcohol Use: No   OB History    No data available     Review of Systems  All systems reviewed and negative, other than as noted in HPI.   Allergies  Codeine  Home Medications   Prior to Admission medications   Medication Sig Start Date End Date Taking? Authorizing Provider   aspirin EC 81 MG tablet Take 81 mg by mouth daily.   Yes Historical Provider, MD  carisoprodol (SOMA) 350 MG tablet Take 1 tablet (350 mg total) by mouth every 8 (eight) hours as needed. 01/15/15  Yes Elenora GammaSamuel L Bradshaw, MD  Diphenhydramine-APAP, sleep, (EXCEDRIN PM PO) Take 4 tablets by mouth at bedtime as needed (sleep).   Yes Historical Provider, MD  DULoxetine (CYMBALTA) 60 MG capsule Take 1 capsule (60 mg total) by mouth daily. 01/15/15  Yes Elenora GammaSamuel L Bradshaw, MD  gabapentin (NEURONTIN) 300 MG capsule TAKE 2 CAPSULES BY MOUTH 3 TIMES DAILY 01/15/15  Yes Elenora GammaSamuel L Bradshaw, MD  meloxicam (MOBIC) 15 MG tablet Take 1 tablet (15 mg total) by mouth daily. 01/15/15  Yes Elenora GammaSamuel L Bradshaw, MD  Multiple Vitamin (MULTIVITAMIN WITH MINERALS) TABS tablet Take 1 tablet by mouth daily.   Yes Historical Provider, MD  ranitidine (ZANTAC) 150 MG tablet Take 150 mg by mouth daily.   Yes Historical Provider, MD  traZODone (DESYREL) 50 MG tablet Take 1 tablet (50 mg total) by mouth at bedtime. 01/15/15  Yes Elenora GammaSamuel L Bradshaw, MD  atorvastatin (LIPITOR) 20 MG tablet Take 1 tablet (20 mg total) by mouth daily. 01/15/15   Elenora GammaSamuel L Bradshaw, MD  Respiratory Therapy Supplies (FLUTTER) DEVI Use as directed Patient not taking: Reported on 01/15/2015 12/22/14   Nyoka Cowden, MD   BP 112/70 mmHg  Pulse 73  Temp(Src) 98.4 F (36.9 C) (Oral)  Resp 23  SpO2 96% Physical Exam  Constitutional: She appears well-developed and well-nourished. No distress.  HENT:  Head: Normocephalic and atraumatic.  Eyes: Conjunctivae are normal. Right eye exhibits no discharge. Left eye exhibits no discharge.  Neck: Neck supple.  Cardiovascular: Normal rate, regular rhythm and normal heart sounds.  Exam reveals no gallop and no friction rub.   No murmur heard. Pulmonary/Chest: Effort normal and breath sounds normal. No respiratory distress.  Abdominal: Soft. She exhibits no distension. There is no tenderness.  Musculoskeletal: She  exhibits no edema or tenderness.  Neurological: She is alert.  Skin: Skin is warm and dry.  Psychiatric: She has a normal mood and affect. Her behavior is normal. Thought content normal.  Nursing note and vitals reviewed.   ED Course  Procedures (including critical care time) Labs Review Labs Reviewed  ACETAMINOPHEN LEVEL - Abnormal; Notable for the following:    Acetaminophen (Tylenol), Serum <10 (*)    All other components within normal limits  CBC - Abnormal; Notable for the following:    Hemoglobin 11.5 (*)    HCT 35.3 (*)    All other components within normal limits  URINE RAPID DRUG SCREEN, HOSP PERFORMED - Abnormal; Notable for the following:    Cocaine POSITIVE (*)    All other components within normal limits  COMPREHENSIVE METABOLIC PANEL  ETHANOL  SALICYLATE LEVEL    Imaging Review Dg Chest 2 View  01/17/2015  CLINICAL DATA:  54 year old with cavitary left upper lobe pneumonia approximately 2 months ago, presenting with recurrent cough and shortness of breath. EXAM: CHEST  2 VIEW COMPARISON:  12/07/2014 and earlier, including CT chest 11/30/2004. FINDINGS: Cardiac silhouette upper normal in size, unchanged. Hilar and mediastinal contours unremarkable. Residual opacity medially in the left upper lobe in the left paratracheal region, improved since the prior examinations. New focal patchy airspace opacity in the left lower lobe. No new pulmonary parenchymal abnormalities elsewhere. Emphysematous changes throughout both lungs with hyperinflation, unchanged. No pleural effusions. Degenerative disc disease and spondylosis involving the thoracic spine. IMPRESSION: 1. New patchy left lower lobe pneumonia superimposed upon baseline COPD/emphysema. 2. Improved cavitary pneumonia in the left upper lobe since the examinations from September, 2016. The residual opacity medially in the left upper lobe may just reflect scarring at the site of the prior pneumonia. Electronically Signed   By:  Hulan Saas M.D.   On: 01/17/2015 16:03   I have personally reviewed and evaluated these images and lab results as part of my medical decision-making.   EKG Interpretation   Date/Time:  Wednesday January 17 2015 16:02:38 EDT Ventricular Rate:  65 PR Interval:  186 QRS Duration: 109 QT Interval:  415 QTC Calculation: 431 R Axis:   77 Text Interpretation:  Sinus rhythm ED PHYSICIAN INTERPRETATION AVAILABLE  IN [facility] Confirmed by TEST, Record ([id number]) on 01/18/2015 7:13:27  AM      MDM   Final diagnoses:  Cocaine abuse  Suicidal ideation  HCAP (healthcare-associated pneumonia)    54 year old female with continued cocaine abuse. Reports suicidal ideation is she can't get help. Reports recent cough. She has x-ray consistent with a left-sided pneumonia. She is afebrile. Oxygen saturations are normal on room air. She has no increased work of breathing. She was started on antibiotics. I  feel she is appropriate for outpatient treatment of this. Treatment of her pneumonia should not preclude further psychiatric care.  Raeford Razor, MD 01/27/15 [phone]

## 2015-01-17 NOTE — ED Notes (Signed)
Per pt, brought in by [employer].  Pt has used crack and cocaine x 10 years.  States no one will put her in rehab.  Pt is depressed for the use.  Pt moved in with her daughter.  Told 1st time she used drugs, she needed to leave.  Pt daughter has kicked her out due to her use.  Pt just "wants to sleep and not wake up".  [facility] team came to patient and stopped her from taking additional meds.  [facility].  Pt took 3 Soma.  Pt notified [facility] that she had taken the pills prior to their arrival.
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
## 2015-01-17 NOTE — ED Notes (Signed)
Bed: [id number] Expected date:  Expected time:  Means of arrival:  Comments: For triage

## 2015-01-17 NOTE — Progress Notes (Signed)
01/17/15 1820  Spoke with Judeth CornfieldStephanie in poison control per [place] after 6 hrs of observation in the Ed, patient is eligible for the next step of treatment since vitals and lab work results were stable.

## 2015-01-18 ENCOUNTER — Encounter (HOSPITAL_COMMUNITY): Payer: Self-pay | Admitting: *Deleted

## 2015-01-18 ENCOUNTER — Inpatient Hospital Stay (HOSPITAL_COMMUNITY)
Admission: AD | Admit: 2015-01-18 | Discharge: 2015-01-25 | DRG: 885 | Disposition: A | Discharge status: Home / Self Care | Payer: Medicare Other | Source: Intra-hospital | Attending: Psychiatry | Admitting: Psychiatry

## 2015-01-18 DIAGNOSIS — R45851 Suicidal ideations: Secondary | ICD-10-CM | POA: Diagnosis present

## 2015-01-18 DIAGNOSIS — G4700 Insomnia, unspecified: Secondary | ICD-10-CM | POA: Diagnosis present

## 2015-01-18 DIAGNOSIS — F1424 Cocaine dependence with cocaine-induced mood disorder: Secondary | ICD-10-CM | POA: Diagnosis not present

## 2015-01-18 DIAGNOSIS — F1420 Cocaine dependence, uncomplicated: Secondary | ICD-10-CM | POA: Diagnosis present

## 2015-01-18 DIAGNOSIS — F410 Panic disorder [episodic paroxysmal anxiety] without agoraphobia: Secondary | ICD-10-CM | POA: Diagnosis present

## 2015-01-18 DIAGNOSIS — Z91411 Personal history of adult psychological abuse: Secondary | ICD-10-CM

## 2015-01-18 DIAGNOSIS — M1990 Unspecified osteoarthritis, unspecified site: Secondary | ICD-10-CM | POA: Diagnosis present

## 2015-01-18 DIAGNOSIS — Z8249 Family history of ischemic heart disease and other diseases of the circulatory system: Secondary | ICD-10-CM

## 2015-01-18 DIAGNOSIS — Z91410 Personal history of adult physical and sexual abuse: Secondary | ICD-10-CM | POA: Diagnosis not present

## 2015-01-18 DIAGNOSIS — Z96642 Presence of left artificial hip joint: Secondary | ICD-10-CM | POA: Diagnosis present

## 2015-01-18 DIAGNOSIS — F1410 Cocaine abuse, uncomplicated: Secondary | ICD-10-CM | POA: Insufficient documentation

## 2015-01-18 DIAGNOSIS — Z590 Homelessness: Secondary | ICD-10-CM

## 2015-01-18 DIAGNOSIS — F17210 Nicotine dependence, cigarettes, uncomplicated: Secondary | ICD-10-CM | POA: Diagnosis present

## 2015-01-18 DIAGNOSIS — F331 Major depressive disorder, recurrent, moderate: Principal | ICD-10-CM | POA: Diagnosis present

## 2015-01-18 DIAGNOSIS — J189 Pneumonia, unspecified organism: Secondary | ICD-10-CM

## 2015-01-18 DIAGNOSIS — F411 Generalized anxiety disorder: Secondary | ICD-10-CM | POA: Diagnosis present

## 2015-01-18 DIAGNOSIS — F1494 Cocaine use, unspecified with cocaine-induced mood disorder: Secondary | ICD-10-CM | POA: Diagnosis present

## 2015-01-18 MED ORDER — ADULT MULTIVITAMIN W/MINERALS CH
1.0000 | ORAL_TABLET | Freq: Every day | ORAL | Status: DC
  Administered 2015-01-19 – 2015-01-25 (×7): 1 via ORAL
  Filled 2015-01-18 (×9): qty 1

## 2015-01-18 MED ORDER — MELOXICAM 15 MG PO TABS
15.0000 mg | ORAL_TABLET | Freq: Every day | ORAL | Status: DC
  Administered 2015-01-19 – 2015-01-21 (×3): 15 mg via ORAL
  Filled 2015-01-18 (×4): qty 1

## 2015-01-18 MED ORDER — FAMOTIDINE 20 MG PO TABS
20.0000 mg | ORAL_TABLET | Freq: Two times a day (BID) | ORAL | Status: DC
  Administered 2015-01-18 – 2015-01-25 (×14): 20 mg via ORAL
  Filled 2015-01-18 (×19): qty 1

## 2015-01-18 MED ORDER — MAGNESIUM HYDROXIDE 400 MG/5ML PO SUSP: 30.0000 mL | Freq: Every day | ORAL | Status: DC | PRN

## 2015-01-18 MED ORDER — HYDROXYZINE HCL 50 MG PO TABS
50.0000 mg | ORAL_TABLET | Freq: Three times a day (TID) | ORAL | Status: DC | PRN
  Administered 2015-01-18 – 2015-01-25 (×14): 50 mg via ORAL
  Filled 2015-01-18 (×15): qty 1

## 2015-01-18 MED ORDER — DULOXETINE HCL 60 MG PO CPEP
60.0000 mg | ORAL_CAPSULE | Freq: Every day | ORAL | Status: DC
  Administered 2015-01-19 – 2015-01-25 (×7): 60 mg via ORAL
  Filled 2015-01-18 (×9): qty 1

## 2015-01-18 MED ORDER — CARISOPRODOL 350 MG PO TABS
350.0000 mg | ORAL_TABLET | Freq: Three times a day (TID) | ORAL | Status: DC | PRN
  Administered 2015-01-18 – 2015-01-25 (×16): 350 mg via ORAL
  Filled 2015-01-18 (×16): qty 1

## 2015-01-18 MED ORDER — ALUM & MAG HYDROXIDE-SIMETH 200-200-20 MG/5ML PO SUSP
30.0000 mL | ORAL | Status: DC | PRN
  Administered 2015-01-23: 30 mL via ORAL
  Filled 2015-01-18: qty 30

## 2015-01-18 MED ORDER — ASPIRIN EC 81 MG PO TBEC
81.0000 mg | DELAYED_RELEASE_TABLET | Freq: Every day | ORAL | Status: DC
  Administered 2015-01-19 – 2015-01-25 (×7): 81 mg via ORAL
  Filled 2015-01-18 (×9): qty 1

## 2015-01-18 MED ORDER — LEVOFLOXACIN 500 MG PO TABS
500.0000 mg | ORAL_TABLET | ORAL | Status: AC
  Administered 2015-01-19 – 2015-01-23 (×5): 500 mg via ORAL
  Filled 2015-01-18: qty 1
  Filled 2015-01-18: qty 2
  Filled 2015-01-18 (×4): qty 1

## 2015-01-18 MED ORDER — ACETAMINOPHEN 325 MG PO TABS
650.0000 mg | ORAL_TABLET | Freq: Four times a day (QID) | ORAL | Status: DC | PRN
  Administered 2015-01-18 – 2015-01-23 (×3): 650 mg via ORAL
  Filled 2015-01-18 (×3): qty 2

## 2015-01-18 MED ORDER — TRAZODONE HCL 50 MG PO TABS
50.0000 mg | ORAL_TABLET | Freq: Every day | ORAL | Status: DC
  Administered 2015-01-18: 50 mg via ORAL
  Filled 2015-01-18 (×3): qty 1

## 2015-01-18 MED ORDER — NICOTINE 21 MG/24HR TD PT24
21.0000 mg | MEDICATED_PATCH | Freq: Every day | TRANSDERMAL | Status: DC
  Administered 2015-01-19 – 2015-01-25 (×7): 21 mg via TRANSDERMAL
  Filled 2015-01-18 (×9): qty 1

## 2015-01-18 MED ORDER — ENSURE ENLIVE PO LIQD
237.0000 mL | Freq: Two times a day (BID) | ORAL | Status: DC
  Administered 2015-01-19: 237 mL via ORAL
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
## 2015-01-18 NOTE — ED Notes (Signed)
Bed: [id number] Expected date:  Expected time:  Means of arrival:  Comments: Hold for rm29

## 2015-01-18 NOTE — Tx Team (Signed)
Initial Interdisciplinary Treatment Plan   PATIENT STRESSORS: Financial difficulties Health problems Loss of spouse 3 years ago Substance abuse   PATIENT STRENGTHS: Ability for insight Average or above average intelligence Capable of independent living Communication skills Motivation for treatment/growth   PROBLEM LIST: Problem List/Patient Goals Date to be addressed Date deferred Reason deferred Estimated date of resolution  "I need help getting off drugs and I need help with coping skills" 01-18-15     Substance Abuse (Cocaine) 01-18-15     Alteration in sleep pattern 01-18-15     Alteration in Mood (Anxiety & Depression) 01-18-15                                    DISCHARGE CRITERIA:  Ability to meet basic life and health needs Adequate post-discharge living arrangements Improved stabilization in mood, thinking, and/or behavior Medical problems require only outpatient monitoring Motivation to continue treatment in a less acute level of care Reduction of life-threatening or endangering symptoms to within safe limits Verbal commitment to aftercare and medication compliance  PRELIMINARY DISCHARGE PLAN: Outpatient therapy Placement in alternative living arrangements  PATIENT/FAMIILY INVOLVEMENT: This treatment plan has been presented to and reviewed with the patient, Caleen EssexConnie D Mcmillen.  The patient have been given the opportunity to ask questions and make suggestions.  Sherryl MangesWesseh, Jaiden Dinkins 01/18/2015, 3:35 PM

## 2015-01-18 NOTE — ED Notes (Signed)
Patient transferred to [facility].  Left the unit ambulatory with [facility].  All belongings given to the driver.  

## 2015-01-18 NOTE — BH Assessment (Signed)
[facility] Assessment Progress Note  Per Thedore MinsMojeed Akintayo, MD, this pt requires psychiatric hospitalization at this time.  Berneice Heinrichina Tate, RN, [facility] Rm [id number].  Pt has signed Voluntary Admission and Consent for Treatment, as well as Consent to Release Information to Flonnie OvermanMonica McLean her case manager at [facility], and a notification call has been placed.  Signed [facility].  Pt's nurse, Rudean HittDawnaly, has been notified, and agrees to send original paperwork along with pt via [facility], and to call report to [phone].  Doylene Canninghomas Ladawn Boullion, MA Triage Specialist [phone]

## 2015-01-18 NOTE — Progress Notes (Addendum)
Pt is a 53 y/o caucasian female71 [facility] with h/o cocaine abuse. On initial contact, pt presented with anxious / animated affect at intervals and depressed mood. Pt was cooperative with admission assessment. Denied SI, HI and AVH when assessed. Reported generalized pain 8/10, receiving nurse made aware. Per nursing report and chart; pt has a h/o of cocaine abuse X 10 years. Pt recently started using drugs after being clean for a month which she relates to stressors such as death of her husband X 3 years ago, poor physical health (Arthritis, Bursitis, Pneumonia & Emphysema) and poor coping skills. Per pt she's been using $100.00 worth of cocaine daily when she can and stated that she was recently put out of her daughter's house by her daughter due to cocaine use. Pt reports decreased concentration level and poor sleep. Skin assessment and belonging search done as per protocol. Pt skin is intact without areas of breakdown at time of assessment. Pt had a lighter in her belonging and is secured in locker 34. Unit orientation done and routines discussed with pt, understanding verbalized. Fluids and snacks offered. Availability, support and encouragement offered. Q 15 minutes checks maintained for safety as ordered without gestures or event of self injurious behavior to report at this time. Pt remain safe on unit.

## 2015-01-18 NOTE — ED Notes (Signed)
MD at bedside. 

## 2015-01-19 ENCOUNTER — Encounter (HOSPITAL_COMMUNITY): Payer: Self-pay | Admitting: Psychiatry

## 2015-01-19 DIAGNOSIS — F411 Generalized anxiety disorder: Secondary | ICD-10-CM

## 2015-01-19 DIAGNOSIS — F331 Major depressive disorder, recurrent, moderate: Principal | ICD-10-CM

## 2015-01-19 DIAGNOSIS — F1424 Cocaine dependence with cocaine-induced mood disorder: Secondary | ICD-10-CM

## 2015-01-19 MED ORDER — GABAPENTIN 300 MG PO CAPS
300.0000 mg | ORAL_CAPSULE | Freq: Three times a day (TID) | ORAL | Status: DC
  Administered 2015-01-19 – 2015-01-25 (×18): 300 mg via ORAL
  Filled 2015-01-19 (×23): qty 1

## 2015-01-19 MED ORDER — BISACODYL 5 MG PO TBEC
5.0000 mg | DELAYED_RELEASE_TABLET | Freq: Every day | ORAL | Status: DC | PRN
  Administered 2015-01-19: 5 mg via ORAL
  Filled 2015-01-19: qty 1

## 2015-01-19 MED ORDER — TRAZODONE HCL 100 MG PO TABS
100.0000 mg | ORAL_TABLET | Freq: Every day | ORAL | Status: DC
  Administered 2015-01-19 – 2015-01-24 (×6): 100 mg via ORAL
  Filled 2015-01-19 (×9): qty 1

## 2015-01-19 NOTE — [facility] Suicide Risk Assessment (Signed)
[facility]   Nursing information obtained from:    Demographic factors:    Current Mental Status:    Loss Factors:    Historical Factors:    Risk Reduction Factors:    Total Time spent with patient: 45 minutes Principal Problem: Depression, major, recurrent, moderate (HCC) Diagnosis:   Patient Active Problem List   Diagnosis Date Noted  . GAD (generalized anxiety disorder) [F41.1] 01/19/2015  . Cocaine abuse [F14.10]   . Suicidal ideation [R45.851]   . Hypokalemia [E87.6] 12/06/2014  . Necrotizing pneumonia (HCC) [J85.0] 12/05/2014  . Suicidal intent [R45.851] 12/05/2014  . Cocaine dependence with cocaine-induced mood disorder (HCC) [F14.24] 11/27/2014  . Depression, major, recurrent, moderate (HCC) [F33.1] 11/27/2014  . Cocaine abuse, continuous use [F14.10] 11/24/2014  . Hyperlipidemia [E78.5]   . GERD (gastroesophageal reflux disease) [K21.9]   . Cigarette smoker [Z72.0] 04/14/2012  . Insomnia [G47.00] 02/20/2012  . Anxiety and depression [F41.8] 08/08/2011     Continued Clinical Symptoms:  Alcohol Use Disorder Identification Test Final Score (AUDIT): 0 The "Alcohol Use Disorders Identification Test", Guidelines for Use in Primary Care, Second Edition.  World Science writerHealth Organization [facility](WHO). Score between 0-7:  no or low risk or alcohol related problems. Score between 8-15:  moderate risk of alcohol related problems. Score between 16-19:  high risk of alcohol related problems. Score 20 or above:  warrants further diagnostic evaluation for alcohol dependence and treatment.   CLINICAL FACTORS:   Depression:   Comorbid alcohol abuse/dependence Alcohol/Substance Abuse/Dependencies  Psychiatric Specialty Exam: Physical Exam  ROS  Blood pressure 101/54, pulse 63, temperature 98.1 F (36.7 C), temperature source Oral, resp. rate 18, height 5\' 3"  (1.6 m), weight 68.947 kg (152 lb), SpO2 98 %.Body mass index is 26.93 kg/(m^2).    COGNITIVE FEATURES  THAT CONTRIBUTE TO RISK:  Closed-mindedness, Polarized thinking and Thought constriction (tunnel vision)    SUICIDE RISK:   Moderate:  Frequent suicidal ideation with limited intensity, and duration, some specificity in terms of plans, no associated intent, good self-control, limited dysphoria/symptomatology, some risk factors present, and identifiable protective factors, including available and accessible social support.  PLAN OF CARE: see admission H and P  Medical Decision Making:  Review of Psycho-Social Stressors (1), Review or order clinical lab tests (1), Review of Medication Regimen & Side Effects (2) and Review of New Medication or Change in Dosage (2)  I certify that inpatient services furnished can reasonably be expected to improve the patient's condition.   Chabeli Barsamian A 01/19/2015, 5:15 PM

## 2015-01-19 NOTE — Progress Notes (Signed)
PT  Note  Patient Details Name: Summer Estrada MRN: [id number] DOB: 06/12/1960   Chart reviewed. Pt viewed ambulating in hall to break room. Spoke with pt's nurse who states pt is getting around unit fairly well with no apparent mobility issues. Did interview pt. Pt states she has chronic pain in her knees, hips, and her neck. She feels she tolerates and deals with them. She is staying mobile and occasionally uses a cane at home.   Pt requested cane to use here, [facility] or have one for her to borrow while she is here?   Educated pt on continuing to stay mobile and activity to help with her joints. Educated with Straight leg raises (supine) for knee strengthening, and side stepping for hip integrity and ROM to increase blood flow to these areas. Pt in agree ance and thanked me for my time.   No further PT needs at this time for pt agreed nothing I could really do. She did appreciate my advice.      Marella BileBRITT, Summer Estrada 01/19/2015, 3:30 PM

## 2015-01-19 NOTE — [facility] Group Notes (Signed)
   [facility] LCSW After[facility] Discharge Planning Group Note  01/19/2015  8:45 AM   Participation Quality: Alert, Appropriate and Oriented  Mood/Affect: Depressed and Flat  Depression Rating: 10  Anxiety Rating: 10  Thoughts of Suicide: Pt denies SI/HI  Will you contract for safety? Yes  Current AVH: Pt denies  Plan for Discharge/Comments: Pt attended discharge planning group and actively participated in group. CSW provided pt with today's workbook. Patient reports not feeling well this morning. CSW assessing patient's interest in residential treatment.  Transportation Means: Pt reports access to transportation  Supports: No supports mentioned at this time  Samuella BruinKristin Lakrisha Iseman, MSW, [employer]CSWA Clinical Social Worker [facility] [phone]

## 2015-01-19 NOTE — B[facility] Counselor (Signed)
Adult Comprehensive Assessment  Patient ID: Caleen EssexConnie D Errickson, female   DOB: 09/26/1960, 54 y.o.   MRN: [id number]  Information Source:    Current Stressors:  Educational / Learning stressors: N/A Employment / Job issues: On disability Family Relationships: Strained family relationships due to substance abuse Surveyor, quantityinancial / Lack of resources (include bankruptcy): Financial stressors Housing / Lack of housing: Recently kicked out of daughter's house, reports that she is currently homeless Physical health (include injuries & life threatening diseases): chronic pain, GERD, arthritis, hyperlipidemia Social relationships: Lack of healthy, positive supports Substance abuse: Daily crack cocaine use Bereavement / Loss: Loss of husband and his ashes, and the loss of their house  Living/Environment/Situation:  Living Arrangements: Alone Living conditions (as described by patient or guardian): Recently kicked out of daughter's house, reports that she is currently homeless How long has patient lived in current situation?: several days What is atmosphere in current home: Temporary  Family History:  Marital status: Widowed Does patient have children?: Yes How many children?: 3 How is patient's relationship with their children?: Strained  Childhood History:     Education:  Highest grade of school patient has completed: 11 Currently a student?: No Learning disability?:  (unknown)  Employment/Work Situation:   Employment situation: On disability  Architectinancial Resources:   Does patient have a Lawyerrepresentative payee or guardian?: No  Alcohol/Substance Abuse:   What has been your use of drugs/alcohol within the last 12 months?: Daily crack cocaine use Alcohol/Substance Abuse Treatment Hx:  ([facility] in 11/2014)  Social Support System:   Patient's Community Support System:  (poor) Describe Community Support System: unknown Type of faith/religion: unknown  Leisure/Recreation:   Leisure and  Hobbies: Reports that she used to enjoy cooking but reports no current enjoyable activities  Strengths/Needs:   What things does the patient do well?: unknown In what areas does patient struggle / problems for patient: addiction, strained relationships  Discharge Plan:   Does patient have access to transportation?: Yes Will patient be returning to same living situation after discharge?: No Plan for living situation after discharge: Patient wanting to go into residential treatment Currently receiving community mental health services: No If no, would patient like referral for services when discharged?: Yes (What county?) ([facility] or [facility]) Does patient have financial barriers related to discharge medications?: Yes Patient description of barriers related to discharge medications: Financial stressors  Summary/Recommendations:     Patient is a 54 year old Caucasian female admitted for depression and crack cocaine detox. [place] and was recently kicked out of her daughter's home. [facility]. Patient is interested in residential treatment at discharge. [facility] and [facility]. Patient will benefit from crisis stabilization, medication evaluation, group therapy, and psycho education in addition to case management for discharge planning. Patient and CSW reviewed pt's identified goals and treatment plan. Pt verbalized understanding and agreed to treatment plan.    Juni Glaab, [place] CarboKristin L. 01/19/2015

## 2015-01-19 NOTE — Progress Notes (Signed)
NUTRITION ASSESSMENT  Pt identified as at risk on the Malnutrition Screen Tool  INTERVENTION: 1. Educated patient on the importance of nutrition and encouraged intake of food and beverages. 2. Discussed weight goals. 3. Supplements: continue Ensure Enlive po BID, each supplement provides 350 kcal and 20 grams of protein  NUTRITION DIAGNOSIS: Unintentional weight loss related to sub-optimal intake as evidenced by pt report.   Goal: Pt to meet >/= 90% of their estimated nutrition needs.  Monitor:  PO intake  Assessment:  Pt seen for MST. Pt admitted with depression and anxiety and cocaine abuse.  Per review, pt has lost 8 lbs (5% body weight) in the past 2 months which is not significant for time frame. Ensure Shiela Mayernlive has already been ordered BID.   54 y.o. female  Height: Ht Readings from Last 1 Encounters:  01/18/15 5\' 3"  (1.6 m)    Weight: Wt Readings from Last 1 Encounters:  01/18/15 152 lb (68.947 kg)    Weight Hx: Wt Readings from Last 10 Encounters:  01/18/15 152 lb (68.947 kg)  01/15/15 153 lb (69.4 kg)  12/22/14 158 lb (71.668 kg)  12/04/14 150 lb (68.04 kg)  12/01/14 150 lb (68.04 kg)  11/23/14 160 lb (72.576 kg)  08/18/14 167 lb (75.751 kg)  11/17/13 176 lb 12.8 oz (80.196 kg)  04/21/13 163 lb 9.6 oz (74.208 kg)  12/27/12 154 lb (69.854 kg)    BMI:  Body mass index is 26.93 kg/(m^2). Pt meets criteria for overweight based on current BMI.  Estimated Nutritional Needs: Kcal: 25-30 kcal/kg Protein: > 1 gram protein/kg Fluid: 1 ml/kcal  Diet Order: Diet Heart Room service appropriate?: Yes; Fluid consistency:: Thin Pt is also offered choice of unit snacks mid-morning and mid-afternoon.  Pt is eating as desired.   Lab results and medications reviewed.      Trenton GammonJessica Alphonsine Minium, RD, LDN Inpatient Clinical Dietitian Pager # [phone] After hours/weekend pager # [phone]

## 2015-01-19 NOTE — Progress Notes (Signed)
Nursing Note: 0700-1900  D:  Mood is anxious, affect animated and anxious.  Pt states that she hurts all over, especially her neck, shoulders and hips.  "I have chronic pain and usually take Neurontin for it."    Self Inventory- rates depression 10/10, hopelessness 10/10 and anxiety 8/10. Noted marked improvement of mood throughout shift. "I don't know what has made me so happy.Marland Kitchen.Marland Kitchen.I had a very good therapy session today." A:  Soma given as ordered for pain, Visitaril for anxiety.  Pt reports pain unchanged, but anxiety improved. MD later ordered Neurontin which has helped pt in the past with pain.  Pt c/o constipation (no BM in 2 days), Dulcolax tab given as ordered.   Encouraged to verbalize needs and concerns, active listening and support provided.  Continued Q 15 minute safety checks.  Observed participation in group settings. R:  Pt. denies A/V hallucinations, SI/HI and is currently able to verbally contract for safety.

## 2015-01-19 NOTE — [facility] Group Notes (Signed)
[facility] LCSW Group Therapy 01/19/2015 1:15 PM Type of Therapy: Group Therapy Participation Level: Active  Participation Quality: Attentive, Sharing and Supportive  Affect: Appropriate  Cognitive: Alert and Oriented  Insight: Developing/Improving and Engaged  Engagement in Therapy: Developing/Improving and Engaged  Modes of Intervention: Clarification, Confrontation, Discussion, Education, Exploration, Limit-setting, Orientation, Problem-solving, Rapport Building, Dance movement psychotherapisteality Testing, Socialization and Support  Summary of Progress/Problems: The topic for today was feelings about relapse. Pt discussed what relapse prevention is to them and identified triggers that they are on the path to relapse. Pt processed their feeling towards relapse and was able to relate to peers. Pt discussed coping skills that can be used for relapse prevention. Patient discussed how her substance abuse has strained her relationships with her family and contributed to her being homeless and her medical ailments. Patient stated that she enjoyed getting high but wants to change for the above reasons. Patient expressed her interest in residential treatment. CSW and other group members provided patient with emotional support and encouragement.   Summer Estrada, MSW, [employer]CSWA Clinical Social Worker [facility] [phone]
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
## 2015-01-19 NOTE — Progress Notes (Signed)
D: Pt is alert and oriented x4. Pt complained of moderate depression and anxiety; however, admitted that they have gotten better than what they were a day earlier. She states, "My anxiety and depression are still bad but I feel a little better than I was yesterday." Pt also complained of severe generalized body pain of 9 on a 0-10 pain scale. Pt denies SI, HI, and AVH. Pt was cooperative and appreciative.  A: Pt was encouraged to attend group. Medications offered as prescribed.  Support, encouragement, and safe environment provided.  15-minute safety checks continue.  R: Pt was med compliant.  Pt attended group. Safety checks continue.

## 2015-01-19 NOTE — [facility] Group Notes (Signed)
Interdisciplinary Treatment Plan Update (Adult) Date: 01/19/2015    Time Reviewed: 9:30 AM  Progress in Treatment: Attending groups: Continuing to assess, patient new to milieu Participating in groups: Continuing to assess, patient new to milieu Taking medication as prescribed: Yes Tolerating medication: Yes Family/Significant other contact made: No, CSW assessing for appropriate contacts Patient understands diagnosis: Yes Discussing patient identified problems/goals with staff: Yes Medical problems stabilized or resolved: Yes Denies suicidal/homicidal ideation: Yes Issues/concerns per patient self-inventory: Yes Other:  New problem(s) identified: N/A  Discharge Plan or Barriers: CSW continuing to assess, patient new to milieu.  Reason for Continuation of Hospitalization:  Depression Anxiety Medication Stabilization   Comments: N/A  Estimated length of stay: 3-5 days   Patient is a 54 year old Caucasian female admitted for SI and crack cocaine abuse. Patient is homeless. She has had previous admissions to [facility]. Patient will benefit from crisis stabilization, medication evaluation, group therapy, and psycho education in addition to case management for discharge planning. Patient and CSW reviewed pt's identified goals and treatment plan. Pt verbalized understanding and agreed to treatment plan.     Review of initial/current patient goals per problem list:  1. Goal(s): Patient will participate in aftercare plan   Met: No   Target date: 3-5 days post admission date   As evidenced by: Patient will participate within aftercare plan AEB aftercare provider and housing plan at discharge being identified.  11/4: Goal not met: CSW assessing for appropriate referrals for pt and will have follow up secured prior to d/c.      2. Goal (s): Patient will exhibit decreased depressive symptoms and suicidal ideations.   Met: No   Target date: 3-5 days post admission  date   As evidenced by: Patient will utilize self rating of depression at 3 or below and demonstrate decreased signs of depression or be deemed stable for discharge by MD.  11/4: Goal not met: Pt presents with flat affect and depressed mood.  Pt with depression rating of 10 today.  Pt to show decreased sign of depression and a rating of 3 or less before d/c.       3. Goal(s): Patient will demonstrate decreased signs and symptoms of anxiety.   Met: No   Target date: 3-5 days post admission date   As evidenced by: Patient will utilize self rating of anxiety at 3 or below and demonstrated decreased signs of anxiety, or be deemed stable for discharge by MD  11/4: Goal not met: Pt presents with anxious mood and affect.  Pt with anxiety rating of 10 today.  Pt to show decreased sign of anxiety and a rating of 3 or less before d/c.     4. Goal(s): Patient will demonstrate decreased signs of withdrawal due to substance abuse   Met: Yes   Target date: 3-5 days post admission date   As evidenced by: Patient will produce a CIWA/COWS score of 0, have stable vitals signs, and no symptoms of withdrawal  11/4: Goal met: No withdrawal symptoms reported at this time per medical chart.      Attendees: Patient:    Family:    Physician: Dr. Parke Poisson; Dr. Sabra Heck 01/19/2015 9:30 AM  Nursing: Neldon Mc, RN 01/19/2015 9:30 AM  Clinical Social Worker: Tilden Fossa, [place] 01/19/2015 9:30 AM  Other: Peri Maris, LCSWA 01/19/2015 9:30 AM  Other: Norberto Sorenson, P4CC  01/19/2015 9:30 AM  Other:  01/19/2015 9:30 AM  Other: 01/19/2015 9:30 AM  Scribe for Treatment Team:  Tilden Fossa, MSW, [place]

## 2015-01-19 NOTE — Clinical Social Work Note (Signed)
[facility].   Samuella BruinKristin Kaesha Kirsch, MSW, [facility] ValleyCSWA Clinical Social Worker [facility] [phone]

## 2015-01-20 DIAGNOSIS — F331 Major depressive disorder, recurrent, moderate: Secondary | ICD-10-CM | POA: Insufficient documentation

## 2015-01-20 NOTE — Progress Notes (Signed)
[facility] MD Progress Note  01/20/2015 6:52 PM Summer Estrada  MRN:  [id number]   Subjective:  Patient is awake and alert, Attending group session. Patient states having a good day and would like treatment for current drug addiction. Reports having a difficult time after the passing of her husband 3 year ago. States her daughter who moved away so she is bored with life which cause her to drink and use cocaine. States she is learning new coping skills while attending group session, states she will start cooking again. Reports eating and sleeping well. Patient report some anxiety regarding discharge. States she is going to [facility] house and plans to due better with her life. Support, encouragement and reassurance provided.   Principal Problem: Depression, major, recurrent, moderate (HCC) Diagnosis:   Patient Active Problem List   Diagnosis Date Noted  . Depression, major, recurrent, moderate (HCC) [F33.1] 11/27/2014    Priority: High  . GAD (generalized anxiety disorder) [F41.1] 01/19/2015    Priority: Medium  . Cocaine dependence with cocaine-induced mood disorder [facility]) [F14.24] 11/27/2014    Priority: Medium  . Major depressive disorder, recurrent episode, moderate (HCC) [F33.1]   . Cocaine abuse [F14.10]   . Suicidal ideation [R45.851]   . Hypokalemia [E87.6] 12/06/2014  . Necrotizing pneumonia (HCC) [J85.0] 12/05/2014  . Suicidal intent [R45.851] 12/05/2014  . Cocaine abuse, continuous use [F14.10] 11/24/2014  . Hyperlipidemia [E78.5]   . GERD (gastroesophageal reflux disease) [K21.9]   . Cigarette smoker [Z72.0] 04/14/2012  . Insomnia [G47.00] 02/20/2012  . Anxiety and depression [F41.8] 08/08/2011   Total Time spent with patient: 30 minutes  Past Psychiatric History: Anxiety, Depression, Cocaine Abuse  Past Medical History:  Past Medical History  Diagnosis Date  . Knee pain   . Chronic pain   . Wears glasses   . Wears dentures     upper  . GERD (gastroesophageal  reflux disease)     occ  . Depression   . Arthritis   . Hyperlipidemia   . Anxiety     Past Surgical History  Procedure Laterality Date  . Total hip arthroplasty  2010    lt hip  . Cesarean section      x2  . Tubal ligation     Family History:  Family History  Problem Relation Age of Onset  . Kidney disease Mother   . Heart disease Father    Family Psychiatric  History: See HPI Social History:  History  Alcohol Use No     History  Drug Use  . 1.00 per week  . Special: Cocaine    Social History   Social History  . Marital Status: Widowed    Spouse Name: N/A  . Number of Children: N/A  . Years of Education: N/A   Social History Main Topics  . Smoking status: Current Every Day Smoker -- 1.50 packs/day  . Smokeless tobacco: Never Used  . Alcohol Use: No  . Drug Use: 1.00 per week    Special: Cocaine  . Sexual Activity: Yes    Birth Control/ Protection: Post-menopausal     Comment: none   Other Topics Concern  . None   Social History Narrative   WIDOW   CHILDREN; ADULT GIRLS; 3   DISABLED   Additional Social History:         Sleep: Fair  Appetite:  Fair  Current Medications: Current Facility-Administered Medications  Medication Dose Route Frequency Provider Last Rate Last Dose  . acetaminophen (TYLENOL) tablet 650  mg  650 mg Oral Q6H PRN Charm RingsJamison Y Lord, NP   650 mg at 01/18/15 2149  . alum & mag hydroxide-simeth (MAALOX/MYLANTA) 200-200-20 MG/5ML suspension 30 mL  30 mL Oral Q4H PRN Charm RingsJamison Y Lord, NP      . aspirin EC tablet 81 mg  81 mg Oral Daily Charm RingsJamison Y Lord, NP   81 mg at 01/20/15 0818  . bisacodyl (DULCOLAX) EC tablet 5 mg  5 mg Oral Daily PRN Rachael FeeIrving A Lugo, MD   5 mg at 01/19/15 1336  . carisoprodol (SOMA) tablet 350 mg  350 mg Oral Q8H PRN Rachael FeeIrving A Lugo, MD   350 mg at 01/20/15 1703  . DULoxetine (CYMBALTA) DR capsule 60 mg  60 mg Oral Daily Charm RingsJamison Y Lord, NP   60 mg at 01/20/15 0818  . famotidine (PEPCID) tablet 20 mg  20 mg Oral BID  Charm RingsJamison Y Lord, NP   20 mg at 01/20/15 1659  . feeding supplement (ENSURE ENLIVE) (ENSURE ENLIVE) liquid 237 mL  237 mL Oral BID BM Rachael FeeIrving A Lugo, MD   237 mL at 01/19/15 0811  . gabapentin (NEURONTIN) capsule 300 mg  300 mg Oral TID Rachael FeeIrving A Lugo, MD   300 mg at 01/20/15 1659  . hydrOXYzine (ATARAX/VISTARIL) tablet 50 mg  50 mg Oral TID PRN Worthy FlankIjeoma E Nwaeze, NP   50 mg at 01/20/15 1659  . levofloxacin (LEVAQUIN) tablet 500 mg  500 mg Oral Q24H Charm RingsJamison Y Lord, NP   500 mg at 01/20/15 0818  . magnesium hydroxide (MILK OF MAGNESIA) suspension 30 mL  30 mL Oral Daily PRN Charm RingsJamison Y Lord, NP      . meloxicam (MOBIC) tablet 15 mg  15 mg Oral Daily Charm RingsJamison Y Lord, NP   15 mg at 01/20/15 0818  . multivitamin with minerals tablet 1 tablet  1 tablet Oral Daily Charm RingsJamison Y Lord, NP   1 tablet at 01/20/15 0818  . nicotine (NICODERM CQ - dosed in mg/24 hours) patch 21 mg  21 mg Transdermal Daily Charm RingsJamison Y Lord, NP   21 mg at 01/20/15 0817  . traZODone (DESYREL) tablet 100 mg  100 mg Oral QHS Rachael FeeIrving A Lugo, MD   100 mg at 01/19/15 2117    Lab Results: No results found for this or any previous visit (from the past 48 hour(s)).  Physical Findings: AIMS: Facial and Oral Movements Muscles of Facial Expression: None, normal Lips and Perioral Area: None, normal Jaw: None, normal Tongue: None, normal,Extremity Movements Upper (arms, wrists, hands, fingers): None, normal Lower (legs, knees, ankles, toes): None, normal, Trunk Movements Neck, shoulders, hips: None, normal, Overall Severity Severity of abnormal movements (highest score from questions above): None, normal Incapacitation due to abnormal movements: None, normal Patient's awareness of abnormal movements (rate only patient's report): No Awareness, Dental Status Current problems with teeth and/or dentures?: No Does patient usually wear dentures?: Yes  CIWA:  CIWA-Ar Total: 4 COWS:     Musculoskeletal: Strength & Muscle Tone: decreased Gait & Station:  normal, unsteady using cane to aid with ambulation Patient leans: N/A  Psychiatric Specialty Exam: Review of Systems  Psychiatric/Behavioral: Positive for depression. Negative for suicidal ideas and hallucinations. Substance abuse: + Cocain. The patient is nervous/anxious.   All other systems reviewed and are negative.   Blood pressure 105/75, pulse 74, temperature 98.4 F (36.9 C), temperature source Oral, resp. rate 16, height 5\' 3"  (1.6 m), weight 68.947 kg (152 lb), SpO2 98 %.Body mass index is 26.93  kg/(m^2).  General Appearance: Casual and Neat  Eye Contact::  Fair  Speech:  Clear and Coherent  Volume:  Normal  Mood:  Anxious and Depressed  Affect:  Appropriate and Depressed  Thought Process:  Coherent and Goal Directed  Orientation:  Full (Time, Place, and Person)  Thought Content:  Hallucinations: None  Suicidal Thoughts:  No  Homicidal Thoughts:  No  Memory:  Recent;   Fair  Judgement:  Fair  Insight:  Fair  Psychomotor Activity:  Normal use cane to aid with ambulation  Concentration:  Fair  Recall:  [employer] of Knowledge:Fair  Language: Good  Akathisia:  Negative  Handed:  Right  AIMS (if indicated):     Assets:  Communication Skills Desire for Improvement Housing Physical Health Vocational/Educational  ADL's:  Intact  Cognition: WNL  Sleep:  Number of Hours: 6.75   I agree with to continue with current treatment plan Treatment Plan Summary: Daily contact with patient to assess and evaluate symptoms and progress in treatment and Medication management  Daily contact with patient to assess and evaluate symptoms and progress in treatment and Medication management Supportive approach/coping skills Cocaine dependence; work a relapse prevention plan Depression; pursue the Cymbalta and optimize the response Anxiety; work with the Neurontin  Pain work with the Neurontin and the [employer] and Mobic Work with CBT/mindfulness Explore residential treatment  options   Oneta Rack FNP [facility] 01/20/2015, 6:52 PM

## 2015-01-20 NOTE — Progress Notes (Signed)
Patient ID: Caleen EssexConnie D Swearengin, female   DOB: 02/25/1961, 54 y.o.   MRN: [id number]  Pt currently presents with an animated affect and intrusive, needy  behavior. Per self inventory, pt rates depression, hopelessness and anxiety at a 7. Pt's daily goal is to "listen" and they intend to do so by "listen." Pt mood is labile today, exhibiting intrusive behaviors with other pts. Pt reports good sleep, a good appetite, low energy and poor concentration. Pt main complaint is generalized chronic pain (10/10) in her "neck, shoulders, back and legs; basically all over."   Pt provided with scheduled and prn medications per providers orders. Pt's labs and vitals were monitored throughout the day. Pt supported emotionally and encouraged to express concerns and questions. Pt educated on medications. Pt allowed to use tub room to "soak hips" to decrease hip pain.   Pt's safety ensured with 15 minute and environmental checks. Pt currently denies SI/HI and A/V hallucinations. Pt verbally agrees to seek staff if SI/HI or A/VH occurs and to consult with staff before acting on these thoughts. Will continue POC.

## 2015-01-20 NOTE — [facility] Group Notes (Signed)
[facility] Group Notes:  (Clinical Social Work)   01/13/2015     10:00-11:00AM  Summary of Progress/Problems:   In today's process group a decisional balance exercise was used to explore in depth the perceived benefits and costs of alcohol and drugs, as well as the  benefits and costs of replacing these with healthy coping skills.  Patients listed healthy and unhealthy coping techniques, determining with CSW guidance that unhealthy coping techniques work initially, but eventually become harmful.  Motivational Interviewing and the whiteboard were utilized for the exercises.  The patient expressed that the unhealthy coping she often uses is suicide attempts and along with that, self-blame.    Type of Therapy:  Group Therapy - Process   Participation Level:  Active  Participation Quality:  Attentive  Affect:  Blunted and Depressed  Cognitive:  Alert  Insight:  Developing/Improving  Engagement in Therapy:  Engaged  Modes of Intervention:  Education, Motivational Interviewing  Ambrose MantleMareida Grossman-Orr, LCSW 01/20/2015, 12:43 PM

## 2015-01-20 NOTE — Progress Notes (Signed)
D: Pt is alert and oriented x4. Pt endorses moderate depression and anxiety. She states, "My anxiety and depression is still there; I guess it's my mood swings." Pt also complained of severe generalized body pain of 7 on a 0-10 pain scale. Pt denies continues to SI, HI, and AVH. Pt continues to be cooperative and appreciative.  A: Pt was encouraged to attend group. Medications offered as prescribed.  Support, encouragement, and safe environment provided.  15-minute safety checks continue.  R: Pt was med compliant.  Pt attended group. Safety checks continue.

## 2015-01-20 NOTE — Progress Notes (Signed)
Patient ID: Caleen EssexConnie D Estrada, female   DOB: 06/06/1960, 54 y.o.   MRN: [id number]  Adult Psychoeducational Group Note  Date:  01/20/2015 Time: 09:30am  Group Topic/Focus:  Goals Group:   The focus of this group is to help patients establish daily goals to achieve during treatment and discuss how the patient can incorporate goal setting into their daily lives to aide in recovery.  Participation Level:  Active  Participation Quality:  Appropriate, Intrusive, Monopolizing and Redirectable  Affect:  Anxious  Cognitive:  Alert and Oriented  Insight: Improving  Engagement in Group:  Defensive, Distracting and Improving  Modes of Intervention:  Activity, Education, Orientation and Support  Additional Comments:  Pt able to identify one daily goal to accomplish today; talk to the doctor today.  Aurora Maskwyman, Zyden Suman E 01/20/2015, 10:14 AM

## 2015-01-20 NOTE — Plan of Care (Signed)
Problem: Alteration in mood & ability to function due to Goal: STG: Patient verbalizes decreases in signs of withdrawal Outcome: Completed/Met Date Met:  01/20/15 Pt reports "I'm not going through withdrawals anymore"

## 2015-01-20 NOTE — Progress Notes (Signed)
D.  Pt pleasant on approach, denies complaints at this time.  Positive for evening AA group, interacting appropriately with peers on the unit.  Denies SI/HI/halluicnations at this time.  A.  Support and encouragement offered  R.  Pt remains safe on the unit, will continue to monitor.  

## 2015-01-20 NOTE — Progress Notes (Signed)
Patient did attend the evening speaker AA meeting.  

## 2015-01-21 MED ORDER — ETODOLAC 300 MG PO CAPS
300.0000 mg | ORAL_CAPSULE | Freq: Two times a day (BID) | ORAL | Status: DC
  Administered 2015-01-21 – 2015-01-25 (×9): 300 mg via ORAL
  Filled 2015-01-21 (×13): qty 1

## 2015-01-21 MED ORDER — BOOST / RESOURCE BREEZE PO LIQD
1.0000 | Freq: Three times a day (TID) | ORAL | Status: DC
  Administered 2015-01-21 – 2015-01-25 (×10): 1 via ORAL
  Filled 2015-01-21 (×18): qty 1

## 2015-01-21 NOTE — [facility] Group Notes (Signed)
[facility] Group Notes:  (Clinical Social Work)  01/21/2015  10:00-11:00AM  Summary of Progress/Problems:   The main focus of today's process group was to   1)  discuss the importance of adding supports  2)  define healthy supports versus unhealthy supports  3)  identify the patient's current level of supports and barriers to find more  4)  generate ideas for supports to add  An emphasis was placed on using counselor, doctor, therapy groups, 12-step groups, and problem-specific support groups to expand supports.    The patient expressed full comprehension of the concepts presented, and agreed that there is a need to add more supports.  The patient stated her daughter previously had removed support but now is supportive again.  She was very encouraging to others.  Type of Therapy:  Process Group with Motivational Interviewing  Participation Level:  Active  Participation Quality:  Attentive, Sharing and Supportive  Affect:  Appropriate  Cognitive:  Appropriate and Oriented  Insight:  Engaged  Engagement in Therapy:  Engaged  Modes of Intervention:   Education, Support and Processing, Activity  Ambrose MantleMareida Grossman-Orr, LCSW 01/21/2015

## 2015-01-21 NOTE — Progress Notes (Signed)
[facility] MD Progress Note  01/21/2015 1:04 PM Summer Estrada  MRN:  [id number]   Evaluation:  Patient is awake and alert, Attending group session. Patient states having a good day however is in "terrible pain". "The pain is effective my ability to concentrate today". Patient reports the pain is 10/10.  Patient reports she would usually use drugs to mask the pain. Reports family visit to bring her a cane for walking.  Reports she is still having a difficult time after the passing of her husband 3 year ago, however her depression is better, However she did have a phone call from her daughter on yesterday. Reports she was happy to hear from her. States she is learning to communicate better with her family now that she is getting treatment. Patient report some anxiety regarding discharge. States she ready to speak to social work to her with placement after discharge to [facility]/ halfway house. Support, encouragement and reassurance provided.   Principal Problem: Depression, major, recurrent, moderate (HCC) Diagnosis:   Patient Active Problem List   Diagnosis Date Noted  . Depression, major, recurrent, moderate (HCC) [F33.1] 11/27/2014    Priority: High  . GAD (generalized anxiety disorder) [F41.1] 01/19/2015    Priority: Medium  . Cocaine dependence with cocaine-induced mood disorder [facility]) [F14.24] 11/27/2014    Priority: Medium  . Major depressive disorder, recurrent episode, moderate (HCC) [F33.1]   . Cocaine abuse [F14.10]   . Suicidal ideation [R45.851]   . Hypokalemia [E87.6] 12/06/2014  . Necrotizing pneumonia (HCC) [J85.0] 12/05/2014  . Suicidal intent [R45.851] 12/05/2014  . Cocaine abuse, continuous use [F14.10] 11/24/2014  . Hyperlipidemia [E78.5]   . GERD (gastroesophageal reflux disease) [K21.9]   . Cigarette smoker [Z72.0] 04/14/2012  . Insomnia [G47.00] 02/20/2012  . Anxiety and depression [F41.8] 08/08/2011   Total Time spent with patient: 30 minutes  Past Psychiatric History:  Anxiety, Depression, Cocaine Abuse  Past Medical History:  Past Medical History  Diagnosis Date  . Knee pain   . Chronic pain   . Wears glasses   . Wears dentures     upper  . GERD (gastroesophageal reflux disease)     occ  . Depression   . Arthritis   . Hyperlipidemia   . Anxiety     Past Surgical History  Procedure Laterality Date  . Total hip arthroplasty  2010    lt hip  . Cesarean section      x2  . Tubal ligation     Family History:  Family History  Problem Relation Age of Onset  . Kidney disease Mother   . Heart disease Father    Family Psychiatric  History: See HPI Social History:  History  Alcohol Use No     History  Drug Use  . 1.00 per week  . Special: Cocaine    Social History   Social History  . Marital Status: Widowed    Spouse Name: N/A  . Number of Children: N/A  . Years of Education: N/A   Social History Main Topics  . Smoking status: Current Every Day Smoker -- 1.50 packs/day  . Smokeless tobacco: Never Used  . Alcohol Use: No  . Drug Use: 1.00 per week    Special: Cocaine  . Sexual Activity: Yes    Birth Control/ Protection: Post-menopausal     Comment: none   Other Topics Concern  . None   Social History Narrative   WIDOW   CHILDREN; ADULT GIRLS; 3   DISABLED  Additional Social History:     Sleep: Fair  Appetite:  Fair  Current Medications: Current Facility-Administered Medications  Medication Dose Route Frequency Provider Last Rate Last Dose  . acetaminophen (TYLENOL) tablet 650 mg  650 mg Oral Q6H PRN Charm Rings, NP   650 mg at 01/18/15 2149  . alum & mag hydroxide-simeth (MAALOX/MYLANTA) 200-200-20 MG/5ML suspension 30 mL  30 mL Oral Q4H PRN Charm Rings, NP      . aspirin EC tablet 81 mg  81 mg Oral Daily Charm Rings, NP   81 mg at 01/21/15 0755  . bisacodyl (DULCOLAX) EC tablet 5 mg  5 mg Oral Daily PRN Rachael Fee, MD   5 mg at 01/19/15 1336  . carisoprodol (SOMA) tablet 350 mg  350 mg Oral Q8H PRN  Rachael Fee, MD   350 mg at 01/21/15 0806  . DULoxetine (CYMBALTA) DR capsule 60 mg  60 mg Oral Daily Charm Rings, NP   60 mg at 01/21/15 0755  . etodolac (LODINE) capsule 300 mg  300 mg Oral BID Oneta Rack, NP   300 mg at 01/21/15 1110  . famotidine (PEPCID) tablet 20 mg  20 mg Oral BID Charm Rings, NP   20 mg at 01/21/15 0756  . feeding supplement (ENSURE ENLIVE) (ENSURE ENLIVE) liquid 237 mL  237 mL Oral BID BM Rachael Fee, MD   237 mL at 01/19/15 0811  . gabapentin (NEURONTIN) capsule 300 mg  300 mg Oral TID Rachael Fee, MD   300 mg at 01/21/15 1110  . hydrOXYzine (ATARAX/VISTARIL) tablet 50 mg  50 mg Oral TID PRN Worthy Flank, NP   50 mg at 01/21/15 0757  . levofloxacin (LEVAQUIN) tablet 500 mg  500 mg Oral Q24H Charm Rings, NP   500 mg at 01/21/15 0755  . magnesium hydroxide (MILK OF MAGNESIA) suspension 30 mL  30 mL Oral Daily PRN Charm Rings, NP      . multivitamin with minerals tablet 1 tablet  1 tablet Oral Daily Charm Rings, NP   1 tablet at 01/21/15 0755  . nicotine (NICODERM CQ - dosed in mg/24 hours) patch 21 mg  21 mg Transdermal Daily Charm Rings, NP   21 mg at 01/21/15 0758  . traZODone (DESYREL) tablet 100 mg  100 mg Oral QHS Rachael Fee, MD   100 mg at 01/20/15 2135    Lab Results: No results found for this or any previous visit (from the past 48 hour(s)).  Physical Findings: AIMS: Facial and Oral Movements Muscles of Facial Expression: None, normal Lips and Perioral Area: None, normal Jaw: None, normal Tongue: None, normal,Extremity Movements Upper (arms, wrists, hands, fingers): None, normal Lower (legs, knees, ankles, toes): None, normal, Trunk Movements Neck, shoulders, hips: None, normal, Overall Severity Severity of abnormal movements (highest score from questions above): None, normal Incapacitation due to abnormal movements: None, normal Patient's awareness of abnormal movements (rate only patient's report): No Awareness, Dental  Status Current problems with teeth and/or dentures?: Yes Does patient usually wear dentures?: Yes  CIWA:  CIWA-Ar Total: 4 COWS:     Musculoskeletal: Strength & Muscle Tone: decreased Gait & Station: normal, unsteady using cane to aid with ambulation Patient leans: N/A  Psychiatric Specialty Exam: Review of Systems  Psychiatric/Behavioral: Positive for depression. Negative for suicidal ideas and hallucinations. Substance abuse: + Cocain. The patient is nervous/anxious.   All other systems reviewed and are negative.  Blood pressure 101/70, pulse 83, temperature 98.3 F (36.8 C), temperature source Oral, resp. rate 20, height 5\' 3"  (1.6 m), weight 68.947 kg (152 lb), SpO2 98 %.Body mass index is 26.93 kg/(m^2).  General Appearance: Casual and Neat  Eye Contact::  Fair  Speech:  Clear and Coherent  Volume:  Normal  Mood:  Anxious and Depressed  Affect:  Appropriate and Depressed  Thought Process:  Coherent and Goal Directed  Orientation:  Full (Time, Place, and Person)  Thought Content:  Hallucinations: None  Suicidal Thoughts:  No  Homicidal Thoughts:  No  Memory:  Recent;   Fair  Judgement:  Fair  Insight:  Fair  Psychomotor Activity:  Normal use cane to aid with ambulation  Concentration:  Fair  Recall:  Good  Fund of Knowledge:Fair  Language: Good  Akathisia:  Negative  Handed:  Right  AIMS (if indicated):     Assets:  Communication Skills Desire for Improvement Housing Physical Health Vocational/Educational  ADL's:  Intact  Cognition: WNL  Sleep:  Number of Hours: 7.25   I agree with to continue with current treatment plan  Treatment Plan Summary: Daily contact with patient to assess and evaluate symptoms and progress in treatment and Medication management  Stopped Mobic 15 mg and Started Etodolac 300 mg PO BID for Chronic hip pain Supportive approach/coping skills Cocaine dependence; work a relapse prevention plan Depression; pursue the Cymbalta and optimize  the response Anxiety; work with the Neurontin  Pain work with the Neurontin and the [employer] and Mobic/ Dcd's.  Work with CBT/mindfulness Explore residential treatment options   Oneta Rackanika N Lewis FNP [facility] 01/21/2015, 1:04 PM

## 2015-01-21 NOTE — Progress Notes (Signed)
Patient ID: Summer Estrada, female   DOB: 07/04/1960, 54 y.o.   MRN: [id number] D: Client visible on the unit, interacts appropriately with staff and peers. Client reports depression and anxiety "8" of 10. Reports chronic pain with left hip, that she had surgery on years ago. Client reports goal for this admission "get pain under control" "I'm coming off drugs" "want to go to a 2 week treatment facility, I got a plan" A: Writer provided emotional support encouraged client to move forward with recovery and use support system. Medication reviewed, administered as prescribed. Staff will monitor q8015min for safety. R: Client is safe on the unit, attended group.

## 2015-01-21 NOTE — Progress Notes (Signed)
D:Per patient self inventory form pt reports she slept good last night with the use of sleep medication. She reports a fair appetite, low energy level, poor concentration. Pt rates depression 8/10, hopelessness 8/10, anxiety 8/10- all on 0-10 scale, 10 being the worse. Pt c/o chronic hip pain 10/10. Pt reports her goal for today is "not hurt and work on relationship with daughter. She didn't come again to see me" Pt reports she will "walk and call Rosa" Pt presents with labile behavior. Loud, noticed socializing with peers on the unit. Attending groups on the unit. Pt denies SI/HI. Denies AVH. Intrusive.   A:Special checks q 15 mins in place for safety. Medication administered per MD order (see eMAR). Encouragement and support provided.  R:Safety maintained. Compliant with medication regimen. Will continue to monitor.

## 2015-01-21 NOTE — [facility] Group Notes (Signed)
[facility] Group Notes:  (Nursing/MHT/Case Management/Adjunct)  Date:  01/21/2015  Time: 0930  Type of Therapy:  Nurse Education  Participation Level:  Active  Participation Quality:  Appropriate, Sharing and Supportive  Affect:  Labile  Cognitive:  Alert  Insight:  Good  Engagement in Group:  Distracting and Improving  Modes of Intervention:  Activity, Clarification, Discussion, Socialization and Support  Summary of Progress/Problems:  Dara Hoyershley N Radonna Bracher 01/21/2015, 1:29 PM

## 2015-01-21 NOTE — Progress Notes (Signed)
Patient did attend the evening speaker AA meeting.  

## 2015-01-22 ENCOUNTER — Ambulatory Visit (HOSPITAL_COMMUNITY)
Admit: 2015-01-22 | Discharge: 2015-01-22 | Disposition: A | Discharge status: Home / Self Care | Payer: Medicare Other | Source: Ambulatory Visit | Attending: Internal Medicine | Admitting: Internal Medicine

## 2015-01-22 DIAGNOSIS — J189 Pneumonia, unspecified organism: Secondary | ICD-10-CM | POA: Insufficient documentation

## 2015-01-22 MED ORDER — TRAMADOL HCL 50 MG PO TABS
50.0000 mg | ORAL_TABLET | Freq: Four times a day (QID) | ORAL | Status: DC | PRN
  Administered 2015-01-22 – 2015-01-25 (×8): 50 mg via ORAL
  Filled 2015-01-22 (×8): qty 1

## 2015-01-22 NOTE — Clinical Social Work Note (Signed)
CSW [facility] regarding [facility].    Samuella BruinKristin Hriday Stai, MSW, [employer]LCSWA Clinical Social Worker [facility] [phone]

## 2015-01-22 NOTE — [facility] Group Notes (Signed)
   [facility]  01/22/2015  8:45 AM   Participation Quality: Alert, Appropriate and Oriented  Mood/Affect: Anxious  Depression Rating: Reports high depression level today related to her unknown discharge plans  Anxiety Rating:  Reports high anxiety level today related to her unknown discharge plans  Thoughts of Suicide: Pt denies SI/HI  Will you contract for safety? Yes  Current AVH: Pt denies  Plan for Discharge/Comments: Pt attended discharge planning group and actively participated in group. CSW provided pt with today's workbook. Patient is hoping to get into residential treatment as she is homeless. [facility] and [facility] referrals pending.  Transportation Means: Pt reports access to transportation  Supports: No supports mentioned at this time  Samuella BruinKristin Braylan Faul, MSW, [employer]LCSWA Clinical Social Worker [facility] [phone]

## 2015-01-22 NOTE — Plan of Care (Signed)
Problem: Consults Goal: Depression Patient Education See Patient Education Module for education specifics.  Outcome: Progressing Nurse discussed depression/coping skills with patient.        

## 2015-01-22 NOTE — Consult Note (Signed)
Patient Demographics  Summer Estrada, is a 54 y.o. female   MRN: [id number]   DOB - 06/17/1960  Admit Date - 01/18/2015    Outpatient Primary MD for the patient is No primary care provider on file.  Consult requested in the Hospital by Rachael Fee, MD, On 01/22/2015    Reason for consult Necrotizing pna   With History of -  Past Medical History  Diagnosis Date  . Knee pain   . Chronic pain   . Wears glasses   . Wears dentures     upper  . GERD (gastroesophageal reflux disease)     occ  . Depression   . Arthritis   . Hyperlipidemia   . Anxiety       Past Surgical History  Procedure Laterality Date  . Total hip arthroplasty  2010    lt hip  . Cesarean section      x2  . Tubal ligation      Admitted to behavioral health for drug addiction.  No chief complaint on file.    HPI  Summer Estrada  is a 54 y.o. female, smoker was treated for necrotizing pna and followed by pulmonologist  Dr. Sandrea Hughs. She is admitted to behavioral health due to drug addition, hosptitalist called to f/u on pneumonia, patient reported she still has some dry cough, no wheezing, no sob, no fever.     Review of Systems    In addition to the HPI above,  No Fever-chills, No Headache, No changes with Vision or hearing, No problems swallowing food or Liquids, No Chest pain, Cough or Shortness of Breath, No Abdominal pain, No Nausea or Vommitting, Bowel movements are regular, No Blood in stool or Urine, No dysuria, No new skin rashes or bruises, No new joints pains-aches,  No new weakness, tingling, numbness in any extremity, No recent weight gain or loss, No polyuria, polydypsia or polyphagia, No significant Mental Stressors.  A full 10 point Review of Systems was done, except as stated above, all other Review of Systems were negative.   Social History Social History  Substance  Use Topics  . Smoking status: Current Every Day Smoker -- 1.50 packs/day  . Smokeless tobacco: Never Used  . Alcohol Use: No     Family History Family History  Problem Relation Age of Onset  . Kidney disease Mother   . Heart disease Father      Prior to Admission medications   Medication Sig Start Date End Date Taking? Authorizing Provider  aspirin EC 81 MG tablet Take 81 mg by mouth daily.    Historical Provider, MD  atorvastatin (LIPITOR) 20 MG tablet Take 1 tablet (20 mg total) by mouth daily. 01/15/15   Elenora Gamma, MD  carisoprodol (SOMA) 350 MG tablet Take 1 tablet (350 mg total) by mouth every 8 (eight) hours as needed. 01/15/15   Elenora Gamma, MD  Diphenhydramine-APAP, sleep, (EXCEDRIN PM PO) Take 4 tablets by mouth at bedtime as needed (sleep).  Historical Provider, MD  DULoxetine (CYMBALTA) 60 MG capsule Take 1 capsule (60 mg total) by mouth daily. 01/15/15   Elenora GammaSamuel L Bradshaw, MD  meloxicam (MOBIC) 15 MG tablet Take 1 tablet (15 mg total) by mouth daily. 01/15/15   Elenora GammaSamuel L Bradshaw, MD  Multiple Vitamin (MULTIVITAMIN WITH MINERALS) TABS tablet Take 1 tablet by mouth daily.    Historical Provider, MD  ranitidine (ZANTAC) 150 MG tablet Take 150 mg by mouth daily.    Historical Provider, MD  Respiratory Therapy Supplies (FLUTTER) DEVI Use as directed Patient not taking: Reported on 01/15/2015 12/22/14   Nyoka CowdenMichael B Wert, MD  traZODone (DESYREL) 50 MG tablet Take 1 tablet (50 mg total) by mouth at bedtime. 01/15/15   Elenora GammaSamuel L Bradshaw, MD    Anti-infectives    Start     Dose/Rate Route Frequency Ordered Stop   01/19/15 0800  levofloxacin [facility](LEVAQUIN) tablet 500 mg     500 mg Oral Every 24 hours 01/18/15 1516 01/24/15 0759      Scheduled Meds: . aspirin EC  81 mg Oral Daily  . DULoxetine  60 mg Oral Daily  . etodolac  300 mg Oral BID  . famotidine  20 mg Oral BID  . feeding supplement  1 Container Oral TID BM  . gabapentin  300 mg Oral TID  . levofloxacin   500 mg Oral Q24H  . multivitamin with minerals  1 tablet Oral Daily  . nicotine  21 mg Transdermal Daily  . traZODone  100 mg Oral QHS   Continuous Infusions:  PRN Meds:.acetaminophen, alum & mag hydroxide-simeth, bisacodyl, carisoprodol, hydrOXYzine, magnesium hydroxide, traMADol  Allergies  Allergen Reactions  . Codeine Nausea And Vomiting and Rash    Rash all over body; severe nausea and vomiting.    Physical Exam  Vitals  Blood pressure 105/66, pulse 66, temperature 97.8 F (36.6 C), temperature source Oral, resp. rate 20, height 5\' 3"  (1.6 m), weight 152 lb (68.947 kg), SpO2 98 %.   1. General ambulating with a cane, in NAD,    2. Normal affect and insight, Not Suicidal or Homicidal, Awake Alert, Oriented X 3.  3. No F.N deficits, ALL C.Nerves Intact, Strength 5/5 all 4 extremities, Sensation intact all 4 extremities, Plantars down going.  4. Ears and Eyes appear Normal, Conjunctivae clear, PERRLA. Moist Oral Mucosa.  5. Supple Neck, No JVD, No cervical lymphadenopathy appriciated, No Carotid Bruits.  6. Symmetrical Chest wall movement, Good air movement bilaterally, CTAB.  7. RRR, No Gallops, Rubs or Murmurs, No Parasternal Heave.  8. Positive Bowel Sounds, Abdomen Soft, No tenderness, No organomegaly appriciated,No rebound -guarding or rigidity.  9.  No Cyanosis, Normal Skin Turgor, No Skin Rash or Bruise.  10. Good muscle tone,  joints appear normal , no effusions, Normal ROM.  11. No Palpable Lymph Nodes in Neck or Axillae    Data Review  CBC  Recent Labs Lab 01/17/15 1615  WBC 9.4  HGB 11.5*  HCT 35.3*  PLT 288  MCV 80.2  MCH 26.1  MCHC 32.6  RDW 15.5   ------------------------------------------------------------------------------------------------------------------  Chemistries   Recent Labs Lab 01/17/15 1615  NA 135  K 3.6  CL 104  CO2 24  GLUCOSE 95  BUN 11  CREATININE 0.68  CALCIUM 9.2  AST 18  ALT 16  ALKPHOS 45  BILITOT  0.7   ------------------------------------------------------------------------------------------------------------------ estimated creatinine clearance is 74.9 mL/min (by C-G formula based on Cr of 0.68). ------------------------------------------------------------------------------------------------------------------ No results for input(s): TSH, T4TOTAL,  T3FREE, THYROIDAB in the last 72 hours.  Invalid input(s): FREET3   Coagulation profile No results for input(s): INR, PROTIME in the last 168 hours. ------------------------------------------------------------------------------------------------------------------- No results for input(s): DDIMER in the last 72 hours. -------------------------------------------------------------------------------------------------------------------  Cardiac Enzymes No results for input(s): CKMB, TROPONINI, MYOGLOBIN in the last 168 hours.  Invalid input(s): CK ------------------------------------------------------------------------------------------------------------------ Invalid input(s): POCBNP   ---------------------------------------------------------------------------------------------------------------  Urinalysis    Component Value Date/Time   COLORURINE ORANGE* 12/01/2014 1606   APPEARANCEUR TURBID* 12/01/2014 1606   LABSPEC 1.021 12/01/2014 1606   PHURINE 6.0 12/01/2014 1606   GLUCOSEU NEGATIVE 12/01/2014 1606   HGBUR TRACE* 12/01/2014 1606   BILIRUBINUR SMALL* 12/01/2014 1606   BILIRUBINUR neg 04/21/2013 1623   KETONESUR 15* 12/01/2014 1606   PROTEINUR 100* 12/01/2014 1606   PROTEINUR neg 04/21/2013 1623   UROBILINOGEN 4.0* 12/01/2014 1606   UROBILINOGEN negative 04/21/2013 1623   NITRITE POSITIVE* 12/01/2014 1606   NITRITE neg 04/21/2013 1623   LEUKOCYTESUR MODERATE* 12/01/2014 1606     Imaging results:   Dg Chest 2 View  01/22/2015  CLINICAL DATA:  Cough.  Shortness of breath. EXAM: CHEST  2 VIEW COMPARISON:   Comparison made to prior study of 01/17/2015. FINDINGS: Mediastinum hilar structures normal. Lungs are clear. Interim clearing of left lower lobe infiltrate. No pleural effusion pneumothorax. No acute bony abnormality. IMPRESSION: No acute cardiopulmonary disease. Electronically Signed   By: Maisie Fus  Register   On: 01/22/2015 16:30        Assessment & Plan  Principal Problem:   Depression, major, recurrent, moderate (HCC) Active Problems:   Cocaine dependence with cocaine-induced mood disorder (HCC)   GAD (generalized anxiety disorder)   Major depressive disorder, recurrent episode, moderate (HCC)    1. PNA: repeat cxr showed interim clearing of infiltrate, continue finish abx course, outpatient f/u with Dr. Sherene Sires.    Family Communication: Plan discussed with patient    Thank you for the consult, we will sign off .   Legend Pecore M.D on 01/22/2015 at 6:55 PM  Between 7am to 7pm - Pager - [phone]  After 7pm go to [email or web address] - password [email or web address]   Thank you for the consult, we will follow the patient with you in the Hospital.   [facility] Office  [phone]

## 2015-01-22 NOTE — Progress Notes (Signed)
Recreation Therapy Notes  Date: 11.07.2016 Time: 9:30am Location: [address] Dayroom  Group Topic: Stress Management  Goal Area(s) Addresses:  Patient will actively participate in stress management techniques presented during session.   Behavioral Response: Did not attend.   Kynadi Dragos L Azael Ragain, LRT/CTRS  Mystie Ormand L 01/22/2015 10:23 AM 

## 2015-01-22 NOTE — Progress Notes (Signed)
D:  Patient's self inventory sheet, patient has fair sleep.  Poor appetite, low energy level, good concentration.  Rated depression, hopeless and anxiety #8.  Withdrawals, cravings, agitation, irritability.  Denied SI.  Physical problems, pain, dizziness, headaches.  Worst pain in past 24 hours is #10, knees, hips, neck, shoulder.  Pain medication is not helpful.  Goal is to feel better, chest hurts.  Plans to talk to Dr. Dub MikesLugo.  Bad mood swings continue.  Does have discharge plans.  Has transportation problems after discharge to appointments, etc. A:  Medications administered per MD orders.  Emotional support and encouragement given patient. R:  Denied SI and HI, contracts for safety.  Denied A/V hallucinations.  Safety maintained with 15 minute checks.  Patient ambulates with cane.

## 2015-01-22 NOTE — Progress Notes (Signed)
Pt attended spiritual care group on grief and loss facilitated by chaplain Burnis KingfisherMatthew Stalnaker and counseling intern [facility] Paz[facility]. Group opened with brief discussion and psycho-social ed around grief and loss in relationships and in relation to self - identifying life patterns, circumstances, changes that cause losses. Established group norm of speaking from own life experience. Group goal of establishing open and affirming space for members to share loss and experience with grief, normalize grief experience and provide psycho social education and grief support.  Pt was alert and oriented x4 throughout group. She was actively verbally engaged. Pt reported feelings of loss stemming from the death of her husband approximately 3 years ago. She reports extreme feelings of anger toward him for leaving her alone. She indicated that as a result of his passing she was kicked out of her home because she was unable to pay the bills. The pt discussed feeling as if she has constantly numbed herself with substances in order to not feel the pain, although she indicates that she knows this is ineffective in the long term. Pt discussed feeling frustrated with her daughter, who kicked her out of the house following a relapse. Pt reports feeling isolated and as if no one understands and no one can hold her pain and anger, which is why she does not trust others. We discussed how it felt to share her feelings of anger in the group and allow others in, including how this is a risk she took and what it feels like to be supported by the group.   Graciela HusbandsKathryn Jadian Karman Counseling Intern

## 2015-01-22 NOTE — Progress Notes (Signed)
Pt attended evening AA group. 

## 2015-01-22 NOTE — Progress Notes (Signed)
D: Patient in the hallway on approach.  Patient states she had a good day.  Patient states her goal for today was to get her medications under controlled and ordered. Patient animated but appears to have an anxious affect and depressed mood.  Patient denies SI/HI and AVH. A: Staff to monitor Q 15 mins for safety.  Encouragement and support offered.  Scheduled medications administered per orders. R: Patient remains safe on the unit.  Patient attended group tonight.  Patient visible on the unit.  Patient taking administered medications.

## 2015-01-22 NOTE — Progress Notes (Signed)
[facility] MD Progress Note  01/22/2015 5:04 PM Summer Estrada  MRN:  [id number] Subjective:  Summer Estrada continues to work on her addiction. She is hoping to be able to go to rehab. States she does not think she will be able to make it otherwise. She still endorses pain and she feels it might be somewhat worst. States it is starting to feel like last time when she had to be transferred.  Principal Problem: Depression, major, recurrent, moderate (HCC) Diagnosis:   Patient Active Problem List   Diagnosis Date Noted  . Major depressive disorder, recurrent episode, moderate (HCC) [F33.1]   . GAD (generalized anxiety disorder) [F41.1] 01/19/2015  . Cocaine abuse [F14.10]   . Suicidal ideation [R45.851]   . Hypokalemia [E87.6] 12/06/2014  . Necrotizing pneumonia (HCC) [J85.0] 12/05/2014  . Suicidal intent [R45.851] 12/05/2014  . Cocaine dependence with cocaine-induced mood disorder (HCC) [F14.24] 11/27/2014  . Depression, major, recurrent, moderate (HCC) [F33.1] 11/27/2014  . Cocaine abuse, continuous use [F14.10] 11/24/2014  . Hyperlipidemia [E78.5]   . GERD (gastroesophageal reflux disease) [K21.9]   . Cigarette smoker [Z72.0] 04/14/2012  . Insomnia [G47.00] 02/20/2012  . Anxiety and depression [F41.8] 08/08/2011   Total Time spent with patient: 20 minutes  Past Psychiatric History: See admission H and P  Past Medical History:  Past Medical History  Diagnosis Date  . Knee pain   . Chronic pain   . Wears glasses   . Wears dentures     upper  . GERD (gastroesophageal reflux disease)     occ  . Depression   . Arthritis   . Hyperlipidemia   . Anxiety     Past Surgical History  Procedure Laterality Date  . Total hip arthroplasty  2010    lt hip  . Cesarean section      x2  . Tubal ligation     Family History:  Family History  Problem Relation Age of Onset  . Kidney disease Mother   . Heart disease Father    Family Psychiatric  History: see admission H and P Social History:   History  Alcohol Use No     History  Drug Use  . 1.00 per week  . Special: Cocaine    Social History   Social History  . Marital Status: Widowed    Spouse Name: N/A  . Number of Children: N/A  . Years of Education: N/A   Social History Main Topics  . Smoking status: Current Every Day Smoker -- 1.50 packs/day  . Smokeless tobacco: Never Used  . Alcohol Use: No  . Drug Use: 1.00 per week    Special: Cocaine  . Sexual Activity: Yes    Birth Control/ Protection: Post-menopausal     Comment: none   Other Topics Concern  . None   Social History Narrative   WIDOW   CHILDREN; ADULT GIRLS; 3   DISABLED   Additional Social History:                         Sleep: Fair  Appetite:  Fair  Current Medications: Current Facility-Administered Medications  Medication Dose Route Frequency Provider Last Rate Last Dose  . acetaminophen (TYLENOL) tablet 650 mg  650 mg Oral Q6H PRN Charm Rings, NP   650 mg at 01/22/15 0800  . alum & mag hydroxide-simeth (MAALOX/MYLANTA) 200-200-20 MG/5ML suspension 30 mL  30 mL Oral Q4H PRN Charm Rings, NP      .  aspirin EC tablet 81 mg  81 mg Oral Daily Charm RingsJamison Y Lord, NP   81 mg at 01/22/15 0751  . bisacodyl (DULCOLAX) EC tablet 5 mg  5 mg Oral Daily PRN Rachael FeeIrving A Halla Chopp, MD   5 mg at 01/19/15 1336  . carisoprodol (SOMA) tablet 350 mg  350 mg Oral Q8H PRN Rachael FeeIrving A Jediah Horger, MD   350 mg at 01/22/15 1521  . DULoxetine (CYMBALTA) DR capsule 60 mg  60 mg Oral Daily Charm RingsJamison Y Lord, NP   60 mg at 01/22/15 [id number]  . etodolac (LODINE) capsule 300 mg  300 mg Oral BID Oneta Rackanika N Lewis, NP   300 mg at 01/22/15 1612  . famotidine (PEPCID) tablet 20 mg  20 mg Oral BID Charm RingsJamison Y Lord, NP   20 mg at 01/22/15 1613  . feeding supplement (BOOST / RESOURCE BREEZE) liquid 1 Container  1 Container Oral TID BM Oneta Rackanika N Lewis, NP   1 Container at 01/22/15 1520  . gabapentin (NEURONTIN) capsule 300 mg  300 mg Oral TID Rachael FeeIrving A Mycal Conde, MD   300 mg at 01/22/15 1613  .  hydrOXYzine (ATARAX/VISTARIL) tablet 50 mg  50 mg Oral TID PRN Worthy FlankIjeoma E Nwaeze, NP   50 mg at 01/22/15 0800  . levofloxacin (LEVAQUIN) tablet 500 mg  500 mg Oral Q24H Charm RingsJamison Y Lord, NP   500 mg at 01/22/15 0752  . magnesium hydroxide (MILK OF MAGNESIA) suspension 30 mL  30 mL Oral Daily PRN Charm RingsJamison Y Lord, NP      . multivitamin with minerals tablet 1 tablet  1 tablet Oral Daily Charm RingsJamison Y Lord, NP   1 tablet at 01/22/15 0751  . nicotine (NICODERM CQ - dosed in mg/24 hours) patch 21 mg  21 mg Transdermal Daily Charm RingsJamison Y Lord, NP   21 mg at 01/22/15 0752  . traMADol (ULTRAM) tablet 50 mg  50 mg Oral Q6H PRN Rachael FeeIrving A Monifa Blanchette, MD   50 mg at 01/22/15 1642  . traZODone (DESYREL) tablet 100 mg  100 mg Oral QHS Rachael FeeIrving A Adaline Trejos, MD   100 mg at 01/21/15 2131    Lab Results: No results found for this or any previous visit (from the past 48 hour(s)).  Physical Findings: AIMS: Facial and Oral Movements Muscles of Facial Expression: None, normal Lips and Perioral Area: None, normal Jaw: None, normal Tongue: None, normal,Extremity Movements Upper (arms, wrists, hands, fingers): None, normal Lower (legs, knees, ankles, toes): None, normal, Trunk Movements Neck, shoulders, hips: None, normal, Overall Severity Severity of abnormal movements (highest score from questions above): None, normal Incapacitation due to abnormal movements: None, normal Patient's awareness of abnormal movements (rate only patient's report): No Awareness, Dental Status Current problems with teeth and/or dentures?: Yes Does patient usually wear dentures?: Yes  CIWA:  CIWA-Ar Total: 2 COWS:  COWS Total Score: 2  Musculoskeletal: Strength & Muscle Tone: within normal limits Gait & Station: unsteady Patient leans: normal  Psychiatric Specialty Exam: Review of Systems  Constitutional: Negative.   HENT: Negative.   Eyes: Negative.   Respiratory: Negative.   Cardiovascular: Positive for chest pain.  Gastrointestinal: Negative.    Genitourinary: Negative.   Musculoskeletal: Positive for back pain.  Skin: Negative.   Neurological: Negative.   Endo/Heme/Allergies: Negative.   Psychiatric/Behavioral: Positive for substance abuse.    Blood pressure 105/66, pulse 66, temperature 97.8 F (36.6 C), temperature source Oral, resp. rate 20, height 5\' 3"  (1.6 m), weight 68.947 kg (152 lb), SpO2 98 %.Body mass index  is 26.93 kg/(m^2).  General Appearance: Fairly Groomed  Patent attorney::  Fair  Speech:  Clear and Coherent  Volume:  fluctuates  Mood:  Anxious and worried  Affect:  anxious worried  Thought Process:  Coherent and Goal Directed  Orientation:  Full (Time, Place, and Person)  Thought Content:  symptoms events worries concerns  Suicidal Thoughts:  No  Homicidal Thoughts:  No  Memory:  Immediate;   Fair Recent;   Fair Remote;   Fair  Judgement:  Fair  Insight:  Present  Psychomotor Activity:  Restlessness  Concentration:  Fair  Recall:  [employer] of Knowledge:Fair  Language: Fair  Akathisia:  No  Handed:  Right  AIMS (if indicated):     Assets:  Desire for Improvement  ADL's:  Intact  Cognition: WNL  Sleep:  Number of Hours: 5.5   Treatment Plan Summary: Daily contact with patient to assess and evaluate symptoms and progress in treatment and Medication management Supportive approach/coping skills Cocaine dependence; continue to work a relapse prevention plan Depression; continue to work with the Cymbalta 60 mg daily Insomnia; continue the Trazodone 100 mg HS Pain; continue the Soma, the Ultram, the Neurontin and reassess Pneumonia; will ask internal medicine to assess and recommend Will continue the Levaquin as planned Explore residential treatment options Smokey Melott A 01/22/2015, 5:04 PM

## 2015-01-22 NOTE — Tx Team (Signed)
Interdisciplinary Treatment Plan Update (Adult) Date: 01/22/2015   Time Reviewed: 9:30 AM  Progress in Treatment: Attending groups: Yes Participating in groups: Yes Taking medication as prescribed: Yes Tolerating medication: Yes Family/Significant other contact made: No, CSW assessing for appropriate contacts Patient understands diagnosis: Yes Discussing patient identified problems/goals with staff: Yes Medical problems stabilized or resolved: Yes Denies suicidal/homicidal ideation: Yes Issues/concerns per patient self-inventory: Yes Other:  New problem(s) identified: N/A  Discharge Plan or Barriers: Wanting residential treatment- [facility] and [facility] referrals pending.    Reason for Continuation of Hospitalization:  Depression Anxiety Medication Stabilization   Comments: N/A  Estimated length of stay: 3-5 days   Patient is a 54 year old Caucasian female admitted for depression and crack cocaine detox. Patient lives in [place] and was recently kicked out of her daughter's home. She was at [facility] in 11/2014 for similar issues. Patient is interested in residential treatment at discharge. Referrals have been made to [facility] and [facility]. Patient will benefit from crisis stabilization, medication evaluation, group therapy, and psycho education in addition to case management for discharge planning. Patient and CSW reviewed pt's identified goals and treatment plan. Pt verbalized understanding and agreed to treatment plan.    Review of initial/current patient goals per problem list:  1. Goal(s): Patient will participate in aftercare plan   Met: Goal Progressing   Target date: 3-5 days post admission date   As evidenced by: Patient will participate within aftercare plan AEB aftercare provider and housing plan at discharge being identified.  11/7: Goal not met: CSW assessing for appropriate referrals for pt and will have follow up secured prior to d/c.      2. Goal (s): Patient  will exhibit decreased depressive symptoms and suicidal ideations.   Met: Goal Progressing   Target date: 3-5 days post admission date   As evidenced by: Patient will utilize self rating of depression at 3 or below and demonstrate decreased signs of depression or be deemed stable for discharge by MD.  11/7: Goal Progressing. Patient rates depression as high today related to uncertain discharge plans.     3. Goal(s): Patient will demonstrate decreased signs and symptoms of anxiety.   Met: Goal Progressing   Target date: 3-5 days post admission date   As evidenced by: Patient will utilize self rating of anxiety at 3 or below and demonstrated decreased signs of anxiety, or be deemed stable for discharge by MD  11/7: Goal Progressing. Patient rates depression as high today related to uncertain discharge plans.      4. Goal(s): Patient will demonstrate decreased signs of withdrawal due to substance abuse   Met: Goal met   Target date: 3-5 days post admission date   As evidenced by: Patient will produce a CIWA/COWS score of 0, have stable vitals signs, and no symptoms of withdrawal  11/7: Goal met: No withdrawal symptoms reported at this time per medical chart.        Attendees: Patient:    Family:    Physician: Dr. Parke Poisson; Dr. Sabra Heck 01/22/2015 9:30 AM  Nursing: Mayra Neer, Loletta Specter, Velta Addison, RN 01/22/2015 9:30 AM  Clinical Social Worker: Tilden Fossa,  LCSWA 01/22/2015 9:30 AM  Other: Peri Maris, LCSWA, Heather Smart, LCSW 01/22/2015 9:30 AM  Other: Marijo Sanes Liaison 01/22/2015 9:30 AM  Other: Lars Pinks, Case Manager 01/22/2015 9:30 AM  Other: Catalina Pizza , NP 01/22/2015 9:30 AM  Other: Norberto Sorenson, P4CC   Other:    Other:  Other:    Other:     Scribe for Treatment Team:  Tilden Fossa, MSW, [place]

## 2015-01-22 NOTE — Plan of Care (Signed)
Problem: Alteration in mood & ability to function due to Goal: LTG-Pt reports reduction in suicidal thoughts (Patient reports reduction in suicidal thoughts and is able to verbalize a safety plan for whenever patient is feeling suicidal)  Outcome: Progressing Client is safe on the unit, AEB [id number] safety checks, contracts for safety.

## 2015-01-23 NOTE — Progress Notes (Signed)
Recreation Therapy Notes  Animal-Assisted Activity (AAA) Program Checklist/Progress Notes Patient Eligibility Criteria Checklist & Daily Group note for Rec Tx Intervention  Date: 11.08.2016 Time: 2:45pm Location: [address] Dayroom    AAA/T Program Assumption of Risk Form signed by Patient/ or Parent Legal Guardian yes  Patient is free of allergies or sever asthma yes  Patient reports no fear of animals yes  Patient reports no history of cruelty to animals yes  Patient understands his/her participation is voluntary yes  Behavioral Response: Did not attend.    Summer Estrada L Braylynn Lewing, LRT/CTRS        Riyanna Crutchley L 01/23/2015 3:13 PM 

## 2015-01-23 NOTE — [facility] Group Notes (Signed)
[facility] LCSW Group Therapy  01/23/2015   1:15 PM   Type of Therapy:  Group Therapy  Participation Level:  Active  Participation Quality:  Attentive, Sharing and Supportive  Affect: Nervous and irritated.  Cognitive:  Alert and Oriented  Insight:  Developing/Improving and Engaged  Engagement in Therapy:  Developing/Improving and Engaged  Modes of Intervention:  Clarification, Confrontation, Discussion, Education, Exploration, Limit-setting, Orientation, Problem-solving, Rapport Building, Dance movement psychotherapisteality Testing, Socialization and Support  Summary of Progress/Problems: The topic for group therapy was feelings about diagnosis.  Pt actively participated in group discussion on their past and current diagnosis and how they feel towards this.  Pt also identified how society and family members judge them, based on their diagnosis as well as stereotypes and stigmas.  Ct. Was focused and attentive to topics discussed and was supportive of the sharing of other group members.  Ct. Was especially focused on the sharing of other group members in regards to characteristics of substance use disorder as a genetic predisposition.  Ct. rported that her primary motivation for remaining abstinent from the use of substances was upcoming birth of the ct.'s unborn granddaughter.  Samuella BruinKristin Pleasant Britz, MSW, [employer]CSWA Clinical Social Worker [facility] [phone]

## 2015-01-23 NOTE — [facility] Group Notes (Signed)
The focus of this group is to educate the patient on the purpose and policies of crisis stabilization and provide a format to answer questions about their admission.  The group details unit policies and expectations of patients while admitted.  Patient did not attend 0900 nurse education orientation group this morning.  Patient stayed in bed.   

## 2015-01-23 NOTE — Progress Notes (Signed)
[facility]  01/23/2015 6:13 PM Summer EssexConnie D Estrada  MRN:  [id number] Subjective:  Summer DresserConnie Estrada to endorse that she really needs to a residential treatment program. She is still endorsing mood instability. She is dealing with her chronic pain what can be frustrating at times Principal Problem: Depression, major, recurrent, moderate (HCC) Diagnosis:   Patient Active Problem List   Diagnosis Date Noted  . Major depressive disorder, recurrent episode, moderate (HCC) [F33.1]   . GAD (generalized anxiety disorder) [F41.1] 01/19/2015  . Cocaine abuse [F14.10]   . Suicidal ideation [R45.851]   . Hypokalemia [E87.6] 12/06/2014  . Necrotizing pneumonia (HCC) [J85.0] 12/05/2014  . Suicidal intent [R45.851] 12/05/2014  . Cocaine dependence with cocaine-induced mood disorder (HCC) [F14.24] 11/27/2014  . Depression, major, recurrent, moderate (HCC) [F33.1] 11/27/2014  . Cocaine abuse, continuous use [F14.10] 11/24/2014  . Hyperlipidemia [E78.5]   . GERD (gastroesophageal reflux disease) [K21.9]   . Cigarette smoker [Z72.0] 04/14/2012  . Insomnia [G47.00] 02/20/2012  . Anxiety and depression [F41.8] 08/08/2011   Total Time spent with patient: 30 minutes  Past Psychiatric History: see admission H and P  Past Medical History:  Past Medical History  Diagnosis Date  . Knee pain   . Chronic pain   . Wears glasses   . Wears dentures     upper  . GERD (gastroesophageal reflux disease)     occ  . Depression   . Arthritis   . Hyperlipidemia   . Anxiety     Past Surgical History  Procedure Laterality Date  . Total hip arthroplasty  2010    lt hip  . Cesarean section      x2  . Tubal ligation     Family History:  Family History  Problem Relation Age of Onset  . Kidney disease Mother   . Heart disease Father    Family Psychiatric  History: see admission H and P Social History:  History  Alcohol Use No     History  Drug Use  . 1.00 per week  . Special: Cocaine     Social History   Social History  . Marital Status: Widowed    Spouse Name: N/A  . Number of Children: N/A  . Years of Education: N/A   Social History Main Topics  . Smoking status: Current Every Day Smoker -- 1.50 packs/day  . Smokeless tobacco: Never Used  . Alcohol Use: No  . Drug Use: 1.00 per week    Special: Cocaine  . Sexual Activity: Yes    Birth Control/ Protection: Post-menopausal     Comment: none   Other Topics Concern  . None   Social History Narrative   WIDOW   CHILDREN; ADULT GIRLS; 3   DISABLED   Additional Social History:                         Sleep: Fair  Appetite:  Fair  Current Medications: Current Facility-Administered Medications  Medication Dose Route Frequency Provider Last Rate Last Dose  . acetaminophen (TYLENOL) tablet 650 mg  650 mg Oral Q6H PRN Charm RingsJamison Y Lord, NP   650 mg at 01/23/15 1153  . alum & mag hydroxide-simeth (MAALOX/MYLANTA) 200-200-20 MG/5ML suspension 30 mL  30 mL Oral Q4H PRN Charm RingsJamison Y Lord, NP   30 mL at 01/23/15 0934  . aspirin EC tablet 81 mg  81 mg Oral Daily Charm RingsJamison Y Lord, NP   81 mg at 01/23/15 [id number]  .  bisacodyl (DULCOLAX) EC tablet 5 mg  5 mg Oral Daily PRN Rachael Fee, MD   5 mg at 01/19/15 1336  . carisoprodol (SOMA) tablet 350 mg  350 mg Oral Q8H PRN Rachael Fee, MD   350 mg at 01/23/15 1706  . DULoxetine (CYMBALTA) DR capsule 60 mg  60 mg Oral Daily Charm Rings, NP   60 mg at 01/23/15 0824  . etodolac (LODINE) capsule 300 mg  300 mg Oral BID Oneta Rack, NP   300 mg at 01/23/15 1705  . famotidine (PEPCID) tablet 20 mg  20 mg Oral BID Charm Rings, NP   20 mg at 01/23/15 1705  . feeding supplement (BOOST / RESOURCE BREEZE) liquid 1 Container  1 Container Oral TID BM Oneta Rack, NP   1 Container at 01/23/15 1333  . gabapentin (NEURONTIN) capsule 300 mg  300 mg Oral TID Rachael Fee, MD   300 mg at 01/23/15 1705  . hydrOXYzine (ATARAX/VISTARIL) tablet 50 mg  50 mg Oral TID PRN Worthy Flank, NP   50 mg at 01/23/15 1333  . magnesium hydroxide (MILK OF MAGNESIA) suspension 30 mL  30 mL Oral Daily PRN Charm Rings, NP      . multivitamin with minerals tablet 1 tablet  1 tablet Oral Daily Charm Rings, NP   1 tablet at 01/23/15 [phone]  . nicotine (NICODERM CQ - dosed in mg/24 hours) patch 21 mg  21 mg Transdermal Daily Charm Rings, NP   21 mg at 01/23/15 9604  . traMADol (ULTRAM) tablet 50 mg  50 mg Oral Q6H PRN Rachael Fee, MD   50 mg at 01/23/15 1439  . traZODone (DESYREL) tablet 100 mg  100 mg Oral QHS Rachael Fee, MD   100 mg at 01/22/15 2110    Lab Results: No results found for this or any previous visit (from the past 48 hour(s)).  Physical Findings: AIMS: Facial and Oral Movements Muscles of Facial Expression: None, normal Lips and Perioral Area: None, normal Jaw: None, normal Tongue: None, normal,Extremity Movements Upper (arms, wrists, hands, fingers): None, normal Lower (legs, knees, ankles, toes): None, normal, Trunk Movements Neck, shoulders, hips: None, normal, Overall Severity Severity of abnormal movements (highest score from questions above): None, normal Incapacitation due to abnormal movements: None, normal Patient's awareness of abnormal movements (rate only patient's report): No Awareness, Dental Status Current problems with teeth and/or dentures?: Yes Does patient usually wear dentures?: Yes  CIWA:  CIWA-Ar Total: 2 COWS:  COWS Total Score: 2  Musculoskeletal: Strength & Muscle Tone: within normal limits Gait & Station: walks with a limp Patient leans: Right  Psychiatric Specialty Exam: Review of Systems  Constitutional: Negative.   HENT: Negative.   Eyes: Negative.   Respiratory: Negative.   Cardiovascular: Negative.   Gastrointestinal: Negative.   Genitourinary: Negative.   Musculoskeletal: Positive for back pain and joint pain.  Skin: Negative.   Neurological: Negative.   Endo/Heme/Allergies: Negative.    Psychiatric/Behavioral: Positive for depression and substance abuse. The patient is nervous/anxious and has insomnia.     Blood pressure 104/73, pulse 78, temperature 97.9 F (36.6 C), temperature source Oral, resp. rate 16, height  (1.6 m), weight 68.947 kg (152 lb), SpO2 98 %.Body mass index is 26.93 kg/(m^2).  General Appearance: Fairly Groomed  Patent attorney::  Fair  Speech:  Clear and Coherent  Volume:  fluctuates  Mood:  Anxious and worried  Affect:  anxious worried  Thought Process:  Coherent and Goal Directed  Orientation:  Full (Time, Place, and Person)  Thought Content:  symptoms events worries concerns  Suicidal Thoughts:  No  Homicidal Thoughts:  No  Memory:  Immediate;   Fair Recent;   Fair Remote;   Fair  Judgement:  Fair  Insight:  Present  Psychomotor Activity:  Restlessness  Concentration:  Fair  Recall:  [employer] of Knowledge:Fair  Language: Fair  Akathisia:  No  Handed:  Right  AIMS (if indicated):     Assets:  Desire for Improvement  ADL's:  Intact  Cognition: WNL  Sleep:  Number of Hours: 5.5   Treatment Plan Summary: Daily contact with patient to assess and evaluate symptoms and progress in treatment and Medication management Supportive approach/coping skills Cocaine dependence; work a relapse prevention plan Depression; continue the Cymbalta 60 mg daily ( it could also help the pain) Anxiety-agitation; will continue the Neurontin 300 mg TID ( it could also help the pain) Insomnia: continue the Trazodone 100 mg HS Use CBT/mindfulness Wilmoth Rasnic A 01/23/2015, 6:13 PM

## 2015-01-23 NOTE — Progress Notes (Signed)
Pt attended evening AA group. 

## 2015-01-23 NOTE — [facility] Suicide Risk Assessment (Signed)
[facility]   Demographic Factors:  Caucasian  Total Time spent with patient: 30 minutes  Musculoskeletal: Strength & Muscle Tone: within normal limits Gait & Station: walks with a limp Patient leans: Right  Psychiatric Specialty Exam: Physical Exam  Review of Systems  Constitutional: Negative.   HENT: Negative.   Eyes: Negative.   Respiratory: Negative.   Cardiovascular: Negative.   Gastrointestinal: Negative.   Genitourinary: Negative.   Musculoskeletal: Positive for joint pain.  Skin: Negative.   Endo/Heme/Allergies: Negative.   Psychiatric/Behavioral: Positive for depression and substance abuse. The patient is nervous/anxious.     Blood pressure 104/73, pulse 78, temperature 97.9 F (36.6 C), temperature source Oral, resp. rate 16, height 5\' 3"  (1.6 m), weight 68.947 kg (152 lb), SpO2 98 %.Body mass index is 26.93 kg/(m^2).  General Appearance: Fairly Groomed  Patent attorneyye Contact::  Fair  Speech:  Clear and Coherent409  Volume:  fluctuates  Mood:  Anxious  Affect:  Appropriate  Thought Process:  Coherent and Goal Directed  Orientation:  Full (Time, Place, and Person)  Thought Content:  plans as she moves on, relapse prevention plan  Suicidal Thoughts:  No  Homicidal Thoughts:  No  Memory:  Immediate;   Fair Recent;   Fair Remote;   Fair  Judgement:  Fair  Insight:  Present and Shallow  Psychomotor Activity:  Restlessness  Concentration:  Fair  Recall:  [employer]Fair  Fund of Knowledge:Fair  Language: Fair  Akathisia:  No  Handed:  Right  AIMS (if indicated):     Assets:  Desire for Improvement  Sleep:  Number of Hours: 5.5  Cognition: WNL  ADL's:  Intact   Have you used any form of tobacco in the last 30 days? (Cigarettes, Smokeless Tobacco, Cigars, and/or Pipes): Yes  Has this patient used any form of tobacco in the last 30 days? (Cigarettes, Smokeless Tobacco, Cigars, and/or Pipes) Yes, A prescription for an FDA-approved tobacco cessation  medication was offered at discharge and the patient refused  Mental Status Per Nursing Assessment::   On Admission:     Current Mental Status by Physician: In full contact with reality. There are no active S/S of withdrawal. There are no active SI plan or intent.  She is willing and motivated to pursue a residential treatment   Loss Factors: NA  Historical Factors: NA  Risk Reduction Factors:   Sense of responsibility to family  Continued Clinical Symptoms:  Depression:   Comorbid alcohol abuse/dependence Alcohol/Substance Abuse/Dependencies  Cognitive Features That Contribute To Risk:  Closed-mindedness, Polarized thinking and Thought constriction (tunnel vision)    Suicide Risk:  Minimal: No identifiable suicidal ideation.  Patients presenting with no risk factors but with morbid ruminations; may be classified as minimal risk based on the severity of the depressive symptoms  Principal Problem: Depression, major, recurrent, moderate (HCC) Discharge Diagnoses:  Patient Active Problem List   Diagnosis Date Noted  . Major depressive disorder, recurrent episode, moderate (HCC) [F33.1]   . GAD (generalized anxiety disorder) [F41.1] 01/19/2015  . Cocaine abuse [F14.10]   . Suicidal ideation [R45.851]   . Hypokalemia [E87.6] 12/06/2014  . Necrotizing pneumonia (HCC) [J85.0] 12/05/2014  . Suicidal intent [R45.851] 12/05/2014  . Cocaine dependence with cocaine-induced mood disorder (HCC) [F14.24] 11/27/2014  . Depression, major, recurrent, moderate (HCC) [F33.1] 11/27/2014  . Cocaine abuse, continuous use [F14.10] 11/24/2014  . Hyperlipidemia [E78.5]   . GERD (gastroesophageal reflux disease) [K21.9]   . Cigarette smoker [Z72.0] 04/14/2012  . Insomnia [G47.00]  02/20/2012  . Anxiety and depression [F41.8] 08/08/2011    Follow-up Information    Follow up with [facility] On 01/25/2015.   Why:  Screening assessment for possible admission on Thursday Nov. 10th at 8am.  Bring your ID, social security card, medicaid card, and 2 week supply of medications. Call if you need to reschedule.   Contact information:   [address] [place], [place] [address] Phone: [phone]      Plan Of Care/Follow-up recommendations:  Activity:  as tolerated Diet:  regular Follow up [facility] residential as above Is patient on multiple antipsychotic therapies at discharge:  No   Has Patient had three or more failed trials of antipsychotic monotherapy by history:  No  Recommended Plan for Multiple Antipsychotic Therapies: NA    Danijah Noh A 01/23/2015, 7:08 PM

## 2015-01-23 NOTE — Progress Notes (Signed)
D: Patient in the hallway on approach.  Patient states she had a much better day.  Patient states she is worried about where he will go after discharge.  Patient states she wants to go to a facility where she can smoke cigarettes.  Patient states, "I am not ready to give up those yet.  It will be the only thing I have control over."  Patient states she is homeless and states if she is discharged from here and does not have rehab facility to    Patient denies SI/HI and denies AVH.  Patient has been intrusive tonight and has had to be redirected but was cooperative. A: Staff to monitor Q 15 mins for safety.  Encouragement and support offered.  Scheduled medications administered per orders.  Tramadol administered prn for pain hip pain. R: Patient remains safe on the unit.  Patient attended group tonight.  Patient visible on he unit and interacting with peers.  Patient taking administered medications.

## 2015-01-23 NOTE — Progress Notes (Signed)
  Pt currently presents with a flat affect and anxious behavior. Per self inventory, pt rates depression, hopelessness and anxiety at 6. Pt's daily goal is "my discharge plan" and they intend to do so by "talk to my social worker." Pt reports good sleep, a good appetite, low energy and good concentration. Pt mood has been labile today, pt reports moments of anger and agitation. Pt reports pain in her hips. Pt writes on her self inventory that she is "having some trouble with ..." another pt on the unit.   Pt provided with medications per providers orders. Pt's labs and vitals were monitored throughout the day. Pt supported emotionally and encouraged to express concerns and questions. Pt educated on medications and alternative relaxation techniques.   Pt's safety ensured with 15 minute and environmental checks. Pt currently denies SI/HI and A/V hallucinations. Pt verbally agrees to seek staff if SI/HI or A/VH occurs and to consult with staff before acting on these thoughts. Pt to be discharged tomorrow morning for her follow up appointment with [facility]. Will continue POC.

## 2015-01-24 NOTE — Progress Notes (Signed)
D. Pt present with depressed mood and irritable behavior on the unit. Pt got mad this morning when she was told to wait on the line for her turn to take medication. Pt stated, " You can keep those medications" and walked away. Pt was redirected and took her medication later. Pt complained of hip pain 8/10, SOMA 350 mg PO, and tramadol 50 mg PO given with good relief. As per self inventory, pt had a good night sleep, good appetite, low energy, and poor concentration. Her [place] for today " [place] [facility] [place] [facility] [place] and will do this by " talk to social worker and keep calling her." Pt reported that her  depression was a 9, her hopelessness was a 9, and that her anxiety was a 9. Pt reported being negative SI/HI, no AH/VH noted. A: 15 min checks continued for patient safety. R: Pts safety maintained.

## 2015-01-24 NOTE — Progress Notes (Signed)
D:Patient in the hallway on approach.  Patient states she had a stressful day.  Patient states she wants to get into a treatment program but states it has to be a program where she can smoke.  Patient states she had a phone interview tonight at 9:30pm and needed to know the names of the medications she was taking.  Patient was asked by writer not to speak about medications with peers in the dayroom after Clinical research associatewriter overheard conversation about medications.  Patient then became upset about 30 minutes later after stating one of her peers [facility]  Patient requested Vistaril at this time but she has just had it 3 hour prior and it was not time yet.  Writer talked with patient about deep breathing and thinking through what she was upset about and patient appeared to calm down.    A: Staff to monitor Q 15 mins for safety.  Encouragement and support offered.  Scheduled medications administered per orders.  Tramadol administered prn for pain.  Trazodone administered prn for sleep. R: Patient remains safe on the unit.  Patient attended group tonight.  Patient visible on the unit and interacting with peers.  Patient taking administered.

## 2015-01-24 NOTE — [facility] Group Notes (Signed)
[facility] LCSW Aftercare Discharge Planning Group Note  01/24/2015  8:45 AM  Participation Quality: Did Not Attend. Patient invited to participate but declined.  Jami Bogdanski, MSW, LCSWA Clinical Social Worker [facility] [phone]  

## 2015-01-24 NOTE — Progress Notes (Signed)
Recreation Therapy Notes  Date: 01/24/15 Time: 930 Location: 300 Hall Dayroom  Group Topic: Stress Management  Goal Area(s) Addresses:  Patient will verbalize importance of using healthy stress management.  Patient will identify positive emotions associated with healthy stress management.   Intervention: Stress Management  Activity :  Progressive Muscle Relaxation.  LRT introduced and educated patients on stress management technique of progressive muscle relaxation.  A script was used to deliver the technique to patients and patients were asked to follow script read allowed by LRT to engage in practicing the stress management technique.    Education:  Stress Management, Discharge Planning.   Education Outcome: Acknowledges edcuation/In group clarification offered/Needs additional education  Clinical Observations/Feedback:  Patient did not attend group.    Caroll RancherMarjette Kennedy Bohanon, LRT/CTRS         Caroll RancherLindsay, Carola Viramontes A 01/24/2015 4:14 PM

## 2015-01-24 NOTE — Clinical Social Work Note (Signed)
[facility] CSW will continue to follow for bed availability.  [facility] has [facility] as she can not smoke there.  [facility] at discharge.  Samuella BruinKristin Katana Berthold, MSW, [employer]CS[employer] Clinical Social Worker [facility] [phone]

## 2015-01-24 NOTE — Progress Notes (Signed)
[facility] MD Progress Note  01/24/2015 4:49 PM Summer Estrada  MRN:  [id number] Subjective:  Summer Estrada decided not to go to [facility] as she is not willing to stop smoking. She is wanting an [place] house where she can continue to use the pain medications what she states she needs to be able to function to move around. She is still concerned about relapsing and going back to the same situation she was before Principal Problem: Depression, major, recurrent, moderate (HCC) Diagnosis:   Patient Active Problem List   Diagnosis Date Noted  . Major depressive disorder, recurrent episode, moderate (HCC) [F33.1]   . GAD (generalized anxiety disorder) [F41.1] 01/19/2015  . Cocaine abuse [F14.10]   . Suicidal ideation [R45.851]   . Hypokalemia [E87.6] 12/06/2014  . Necrotizing pneumonia (HCC) [J85.0] 12/05/2014  . Suicidal intent [R45.851] 12/05/2014  . Cocaine dependence with cocaine-induced mood disorder (HCC) [F14.24] 11/27/2014  . Depression, major, recurrent, moderate (HCC) [F33.1] 11/27/2014  . Cocaine abuse, continuous use [F14.10] 11/24/2014  . Hyperlipidemia [E78.5]   . GERD (gastroesophageal reflux disease) [K21.9]   . Cigarette smoker [Z72.0] 04/14/2012  . Insomnia [G47.00] 02/20/2012  . Anxiety and depression [F41.8] 08/08/2011   Total Time spent with patient: 20 min  Past Psychiatric History: see admission H and P  Past Medical History:  Past Medical History  Diagnosis Date  . Knee pain   . Chronic pain   . Wears glasses   . Wears dentures     upper  . GERD (gastroesophageal reflux disease)     occ  . Depression   . Arthritis   . Hyperlipidemia   . Anxiety     Past Surgical History  Procedure Laterality Date  . Total hip arthroplasty  2010    lt hip  . Cesarean section      x2  . Tubal ligation     Family History:  Family History  Problem Relation Age of Onset  . Kidney disease Mother   . Heart disease Father    Family Psychiatric  History: see admission H and  P Social History:  History  Alcohol Use No     History  Drug Use  . 1.00 per week  . Special: Cocaine    Social History   Social History  . Marital Status: Widowed    Spouse Name: N/A  . Number of Children: N/A  . Years of Education: N/A   Social History Main Topics  . Smoking status: Current Every Day Smoker -- 1.50 packs/day  . Smokeless tobacco: Never Used  . Alcohol Use: No  . Drug Use: 1.00 per week    Special: Cocaine  . Sexual Activity: Yes    Birth Control/ Protection: Post-menopausal     Comment: none   Other Topics Concern  . None   Social History Narrative   WIDOW   CHILDREN; ADULT GIRLS; 3   DISABLED   Additional Social History:                         Sleep: Fair  Appetite:  Fair  Current Medications: Current Facility-Administered Medications  Medication Dose Route Frequency Provider Last Rate Last Dose  . acetaminophen (TYLENOL) tablet 650 mg  650 mg Oral Q6H PRN Charm Rings, NP   650 mg at 01/23/15 1153  . alum & mag hydroxide-simeth (MAALOX/MYLANTA) 200-200-20 MG/5ML suspension 30 mL  30 mL Oral Q4H PRN Charm Rings, NP   30 mL at  01/23/15 0934  . aspirin EC tablet 81 mg  81 mg Oral Daily Charm RingsJamison Y Lord, NP   81 mg at 01/24/15 [id number]  . bisacodyl (DULCOLAX) EC tablet 5 mg  5 mg Oral Daily PRN Rachael FeeIrving A Kazmir Oki, MD   5 mg at 01/19/15 1336  . carisoprodol (SOMA) tablet 350 mg  350 mg Oral Q8H PRN Rachael FeeIrving A Sadiq Mccauley, MD   350 mg at 01/24/15 1030  . DULoxetine (CYMBALTA) DR capsule 60 mg  60 mg Oral Daily Charm RingsJamison Y Lord, NP   60 mg at 01/24/15 0921  . etodolac (LODINE) capsule 300 mg  300 mg Oral BID Oneta Rackanika N Lewis, NP   300 mg at 01/24/15 0921  . famotidine (PEPCID) tablet 20 mg  20 mg Oral BID Charm RingsJamison Y Lord, NP   20 mg at 01/24/15 0921  . feeding supplement (BOOST / RESOURCE BREEZE) liquid 1 Container  1 Container Oral TID BM Oneta Rackanika N Lewis, NP   1 Container at 01/24/15 1336  . gabapentin (NEURONTIN) capsule 300 mg  300 mg Oral TID Rachael FeeIrving A  Flor Houdeshell, MD   300 mg at 01/24/15 1140  . hydrOXYzine (ATARAX/VISTARIL) tablet 50 mg  50 mg Oral TID PRN Worthy FlankIjeoma E Nwaeze, NP   50 mg at 01/24/15 0925  . magnesium hydroxide (MILK OF MAGNESIA) suspension 30 mL  30 mL Oral Daily PRN Charm RingsJamison Y Lord, NP      . multivitamin with minerals tablet 1 tablet  1 tablet Oral Daily Charm RingsJamison Y Lord, NP   1 tablet at 01/24/15 [id number]  . nicotine (NICODERM CQ - dosed in mg/24 hours) patch 21 mg  21 mg Transdermal Daily Charm RingsJamison Y Lord, NP   21 mg at 01/24/15 0930  . traMADol (ULTRAM) tablet 50 mg  50 mg Oral Q6H PRN Rachael FeeIrving A Suezette Lafave, MD   50 mg at 01/24/15 0930  . traZODone (DESYREL) tablet 100 mg  100 mg Oral QHS Rachael FeeIrving A Baeleigh Devincent, MD   100 mg at 01/23/15 2202    Lab Results: No results found for this or any previous visit (from the past 48 hour(s)).  Physical Findings: AIMS: Facial and Oral Movements Muscles of Facial Expression: None, normal Lips and Perioral Area: None, normal Jaw: None, normal Tongue: None, normal,Extremity Movements Upper (arms, wrists, hands, fingers): None, normal Lower (legs, knees, ankles, toes): None, normal, Trunk Movements Neck, shoulders, hips: None, normal, Overall Severity Severity of abnormal movements (highest score from questions above): None, normal Incapacitation due to abnormal movements: None, normal Patient's awareness of abnormal movements (rate only patient's report): No Awareness, Dental Status Current problems with teeth and/or dentures?: Yes Does patient usually wear dentures?: Yes  CIWA:  CIWA-Ar Total: 2 COWS:  COWS Total Score: 2  Musculoskeletal: Strength & Muscle Tone: within normal limits Gait & Station: walks with a limp Patient leans: Right  Psychiatric Specialty Exam: Review of Systems  Constitutional: Negative.   HENT: Negative.   Eyes: Negative.   Respiratory: Negative.   Cardiovascular: Negative.   Gastrointestinal: Negative.   Genitourinary: Negative.   Musculoskeletal: Positive for back pain and  joint pain.  Skin: Negative.   Neurological: Negative.   Endo/Heme/Allergies: Negative.   Psychiatric/Behavioral: Positive for depression and substance abuse. The patient is nervous/anxious.     Blood pressure 97/70, pulse 72, temperature 97.8 F (36.6 C), temperature source Oral, resp. rate 16, height 5\' 3"  (1.6 m), weight 68.947 kg (152 lb), SpO2 98 %.Body mass index is 26.93 kg/(m^2).  General Appearance: Fairly Groomed  Eye Contact::  Fair  Speech:  Clear and Coherent  Volume:  fluctuates  Mood:  Anxious and worried  Affect:  anxious worried  Thought Process:  Coherent and Goal Directed  Orientation:  Full (Time, Place, and Person)  Thought Content:  symptoms events worries concerns  Suicidal Thoughts:  No  Homicidal Thoughts:  No  Memory:  Immediate;   Fair Recent;   Fair Remote;   Fair  Judgement:  Fair  Insight:  Fair  Psychomotor Activity:  Restlessness  Concentration:  Fair  Recall:  [employer] of Knowledge:Fair  Language: Fair  Akathisia:  No  Handed:  Right  AIMS (if indicated):     Assets:  Desire for Improvement  ADL's:  Intact  Cognition: WNL  Sleep:  Number of Hours: 5.5   Treatment Plan Summary: Daily contact with patient to assess and evaluate symptoms and progress in treatment and Medication management Supportive approach/coping skills Cocaine dependence: work a relapse prevention plan Depression; continue the Cymbalta 60 mg Insomnia: continue the Trazodone 100 mg HS Pain; continue the Lodine/Ulram/Soma Use CBT/mindfulness Explore other residential treatment options/placement Myra Weng A 01/24/2015, 4:49 PM

## 2015-01-24 NOTE — Progress Notes (Signed)
Pt stated that today was a "shitty" day and she was not fond of the ppl who were working today. Pt did stated that she does have somewhere to go now.

## 2015-01-24 NOTE — [facility] Group Notes (Signed)
[facility] LCSW Group Therapy 01/24/2015  1:15 PM Type of Therapy: Group Therapy Participation Level: Active  Participation Quality: Attentive, Sharing and Supportive  Affect: Appropriate  Cognitive: Alert and Oriented  Insight: Developing/Improving and Engaged  Engagement in Therapy: Developing/Improving and Engaged  Modes of Intervention: Clarification, Confrontation, Discussion, Education, Exploration, Limit-setting, Orientation, Problem-solving, Rapport Building, Dance movement psychotherapisteality Testing, Socialization and Support  Summary of Progress/Problems: The topic for group today was emotional regulation. This group focused on both positive and negative emotion identification and allowed group members to process ways to identify feelings, regulate negative emotions, and find healthy ways to manage internal/external emotions. Group members were asked to reflect on a time when their reaction to an emotion led to a negative outcome and explored how alternative responses using emotion regulation would have benefited them. Group members were also asked to discuss a time when emotion regulation was utilized when a negative emotion was experienced. Patient identified mood swings as being problematic for her and identified difficulty regulating anger. Patient reports that her anger is a result of unresolved grief related to her husband's death and also a result of losing her home. CSW's and other group members provided patient with emotional support and encouragement.   Summer BruinKristin Marisela Line, MSW, [employer]CS[employer] Clinical Social Worker [facility] [phone]

## 2015-01-25 MED ORDER — FAMOTIDINE 20 MG PO TABS: 20.0000 mg | ORAL_TABLET | Freq: Two times a day (BID) | ORAL | Status: DC

## 2015-01-25 MED ORDER — ADULT MULTIVITAMIN W/MINERALS CH: 1.0000 | ORAL_TABLET | Freq: Every day | ORAL | Status: DC

## 2015-01-25 MED ORDER — GABAPENTIN 400 MG PO CAPS
400.0000 mg | ORAL_CAPSULE | Freq: Three times a day (TID) | ORAL | Status: DC
  Administered 2015-01-25: 400 mg via ORAL
  Filled 2015-01-25 (×6): qty 1

## 2015-01-25 MED ORDER — TRAZODONE HCL 100 MG PO TABS: 100.0000 mg | ORAL_TABLET | Freq: Every day | ORAL | Status: DC

## 2015-01-25 MED ORDER — GABAPENTIN 400 MG PO CAPS: 400.0000 mg | ORAL_CAPSULE | Freq: Three times a day (TID) | ORAL | Status: DC

## 2015-01-25 NOTE — Tx Team (Signed)
Interdisciplinary Treatment Plan Update (Adult) Date: 01/25/2015   Time Reviewed: 9:30 AM  Progress in Treatment: Attending groups: Yes Participating in groups: Yes Taking medication as prescribed: Yes Tolerating medication: Yes Family/Significant other contact made: No, CSW has attempted to contact daughter Summer Estrada understands diagnosis: Yes Discussing Summer Estrada identified problems/goals with staff: Yes Medical problems stabilized or resolved: Yes Denies suicidal/homicidal ideation: Yes Issues/concerns per Summer Estrada self-inventory: Yes Other:  New problem(s) identified: N/A  Discharge Plan or Barriers: Plans to discharge to [facility] in [place] and follow up with outpatient services.     Reason for Continuation of Hospitalization:  Depression Anxiety Medication Stabilization   Comments: N/A  Estimated length of stay: Discharge anticipated for today   Summer Estrada is a 54 year old Caucasian female admitted for depression and crack cocaine detox. Summer Estrada lives in [place] and was recently kicked out of her daughter's home. She was at [facility] in 11/2014 for similar issues. Summer Estrada is interested in residential treatment at discharge. Referrals have been made to [facility] and [facility]. Summer Estrada will benefit from crisis stabilization, medication evaluation, group therapy, and psycho education in addition to case management for discharge planning. Summer Estrada and CSW reviewed pt's identified goals and treatment plan. Pt verbalized understanding and agreed to treatment plan.    Review of initial/current Summer Estrada goals per problem list:  1. Goal(s): Summer Estrada will participate in aftercare plan   Met: Yes   Target date: 3-5 days post admission date   As evidenced by: Summer Estrada will participate within aftercare plan AEB aftercare provider and housing plan at discharge being identified.  11/7: Goal not met: CSW assessing for appropriate referrals for pt and will have follow up secured prior to  d/c.  11/10: Goal met: Summer Estrada plans to discharge to [place] to follow up with outpatient services.      2. Goal (s): Summer Estrada will exhibit decreased depressive symptoms and suicidal ideations.   Met: Adequate for discharge per MD   Target date: 3-5 days post admission date   As evidenced by: Summer Estrada will utilize self rating of depression at 3 or below and demonstrate decreased signs of depression or be deemed stable for discharge by MD.  11/7: Goal Progressing. Summer Estrada rates depression as high today related to uncertain discharge plans.  11/10: Adequate for discharge. Summer Estrada reports baseline levels of depression and denies SI.      3. Goal(s): Summer Estrada will demonstrate decreased signs and symptoms of anxiety.   Met: Adequate for discharge per MD   Target date: 3-5 days post admission date   As evidenced by: Summer Estrada will utilize self rating of anxiety at 3 or below and demonstrated decreased signs of anxiety, or be deemed stable for discharge by MD  11/7: Goal Progressing. Summer Estrada rates anxiety as high today related to uncertain discharge plans.  11/10: Adequate for discharge. Summer Estrada reports baseline levels of anxiety.      4. Goal(s): Summer Estrada will demonstrate decreased signs of withdrawal due to substance abuse   Met: Goal met   Target date: 3-5 days post admission date   As evidenced by: Summer Estrada will produce a CIWA/COWS score of 0, have stable vitals signs, and no symptoms of withdrawal  11/7: Goal met: No withdrawal symptoms reported at this time per medical chart.         Attendees: Summer Estrada:    Family:    Physician: Dr. Parke Poisson; Dr. Sabra Heck 01/25/2015 9:30 AM  Nursing: Festus Aloe, Mayra Neer, [address] Grayland Ormond, RN 01/25/2015 9:30 AM  Clinical Social Worker: Candelaria Celeste, Latanya Presser 01/25/2015 9:30 AM  Other: Nira Conn Smart LCSW 01/25/2015 9:30 AM  Other: Ricky Ala, NP 01/25/2015 9:30 AM   Other: Lars Pinks, Case Manager 01/25/2015 9:30 AM  Other: 01/25/2015 9:30 AM         Scribe for Treatment Team:  Tilden Fossa, MSW, [employer] [phone]

## 2015-01-25 NOTE — [facility] Suicide Risk Assessment (Signed)
[facility] Discharge Suicide Risk Assessment   Demographic Factors:  Caucasian  Total Time spent with patient: 30 minutes  Musculoskeletal: Strength & Muscle Tone: within normal limits Gait & Station: walks with a limp Patient leans: right  Psychiatric Specialty Exam: Physical Exam  Review of Systems  Constitutional: Negative.   HENT: Negative.   Eyes: Negative.   Respiratory: Negative.   Cardiovascular: Negative.   Gastrointestinal: Negative.   Genitourinary: Negative.   Musculoskeletal: Positive for back pain and joint pain.  Skin: Negative.   Neurological: Negative.   Endo/Heme/Allergies: Negative.   Psychiatric/Behavioral: Positive for substance abuse. The patient is nervous/anxious.     Blood pressure 106/54, pulse 73, temperature 97.6 F (36.4 C), temperature source Oral, resp. rate 16, height  (1.6 m), weight 68.947 kg (152 lb), SpO2 98 %.Body mass index is 26.93 kg/(m^2).  General Appearance: Fairly Groomed  Patent attorney::  Fair  Speech:  Clear and Coherent409  Volume:  Normal  Mood:  Euthymic  Affect:  Appropriate  Thought Process:  Coherent and Goal Directed  Orientation:  Full (Time, Place, and Person)  Thought Content:  plans as she moves on worries and concerns  Suicidal Thoughts:  No  Homicidal Thoughts:  No  Memory:  Immediate;   Fair Recent;   Fair Remote;   Fair  Judgement:  Fair  Insight:  Present  Psychomotor Activity:  Restlessness  Concentration:  Fair  Recall:  [employer] of Knowledge:Fair  Language: Fair  Akathisia:  No  Handed:  Right  AIMS (if indicated):     Assets:  Desire for Improvement  Sleep:  Number of Hours: 5.5  Cognition: WNL  ADL's:  Intact   Have you used any form of tobacco in the last 30 days? (Cigarettes, Smokeless Tobacco, Cigars, and/or Pipes): Yes  Has this patient used any form of tobacco in the last 30 days? (Cigarettes, Smokeless Tobacco, Cigars, and/or Pipes) Yes, A prescription for an FDA-approved tobacco  cessation medication was offered at discharge and the patient refused  Mental Status Per Nursing Assessment::   On Admission:     Current Mental Status by Physician: In full contact with reality. There are no active S/S of withdrawal. There are no active SI plans or intent. She is going to a half way house in [place]. She knows she cant take the Soma or the Ultram while there and she is willing to at least give it a try   Loss Factors: Decline in physical health  Historical Factors: NA  Risk Reduction Factors:   Living with another person, especially a relative and Positive social support  Continued Clinical Symptoms:  Depression:   Comorbid alcohol abuse/dependence Alcohol/Substance Abuse/Dependencies  Cognitive Features That Contribute To Risk:  Closed-mindedness, Polarized thinking and Thought constriction (tunnel vision)    Suicide Risk:  Minimal: No identifiable suicidal ideation.  Patients presenting with no risk factors but with morbid ruminations; may be classified as minimal risk based on the severity of the depressive symptoms  Principal Problem: Depression, major, recurrent, moderate (HCC) Discharge Diagnoses:  Patient Active Problem List   Diagnosis Date Noted  . Major depressive disorder, recurrent episode, moderate (HCC) [F33.1]   . GAD (generalized anxiety disorder) [F41.1] 01/19/2015  . Cocaine abuse [F14.10]   . Suicidal ideation [R45.851]   . Hypokalemia [E87.6] 12/06/2014  . Necrotizing pneumonia (HCC) [J85.0] 12/05/2014  . Suicidal intent [R45.851] 12/05/2014  . Cocaine dependence with cocaine-induced mood disorder (HCC) [F14.24] 11/27/2014  . Depression, major, recurrent,  moderate (HCC) [F33.1] 11/27/2014  . Cocaine abuse, continuous use [F14.10] 11/24/2014  . Hyperlipidemia [E78.5]   . GERD (gastroesophageal reflux disease) [K21.9]   . Cigarette smoker [Z72.0] 04/14/2012  . Insomnia [G47.00] 02/20/2012  . Anxiety and depression [F41.8] 08/08/2011     Follow-up Information    Follow up with [facility].   Why:  Please go to walk-in clinic at 9:30am on Monday November 14th for assessment for therapy and medication management services. Bring proof of income and your medicaid card.    Contact information:   [address] [place], [place] [address] Phone: [phone]      Plan Of Care/Follow-up recommendations:  Activity:  as tolerated Diet:  regular Follow up as above and AA/NA Is patient on multiple antipsychotic therapies at discharge:  No   Has Patient had three or more failed trials of antipsychotic monotherapy by history:  No  Recommended Plan for Multiple Antipsychotic Therapies: NA    Nou Chard A 01/25/2015, 12:49 PM

## 2015-01-25 NOTE — Progress Notes (Signed)
D: Patient continues to med seek and given her peers advice on what they should be taking.  She is intrusive with staff and her peers.  She denies SI/HI/AVH.  She states she does not [facility] because she cannot take her soma.  She is to be discharged home today.  She is to follow up with [facility] on November 14th. A: Initiate discharge paperwork and review with patient. R: Patient is receptive to staff.

## 2015-01-25 NOTE — Progress Notes (Signed)
  [facility] Adult [facility] Management Discharge Plan :  Will you be returning to the same living situation after discharge:  No. Patient plans to go to [facility] [place] At discharge, do you have transportation home?: Yes,  daughter or bus pass Do you have the ability to pay for your medications: Yes,  patient will be provided with prescriptions at discharge  Release of information consent forms completed and in the chart;  Patient's signature needed at discharge.  Patient to Follow up at: Follow-up Information    Follow up with [facility].   Why:  Please go to walk-in clinic at 9:30am on Monday November 14th for assessment for therapy and medication management services. Bring proof of income and your medicaid card.    Contact information:   [address] [place], [place] [address] Phone: [phone]      Next level of care provider has access to [facility] Link:no  Patient denies SI/HI: Yes,  denies    Safety Planning and Suicide Prevention discussed: Yes,  with patient  Have you used any form of tobacco in the last 30 days? (Cigarettes, Smokeless Tobacco, Cigars, and/or Pipes): Yes  Has patient been referred to the Quitline?: Patient refused referral  Kathlee Barnhardt, [place] CarboKristin L 01/25/2015, 10:36 AM

## 2015-01-25 NOTE — Progress Notes (Signed)
Discharge note:  Patient discharged per MD order.  Patient received all personal belongings.  She was given prescriptions for her medications.  Patient was given a bus ticket and $5.00.  She was also given a cab voucher.  [place] [facility].  She denies SI/HI/AVH.  Patient left without incident in a cab.
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
## 2015-01-26 ENCOUNTER — Ambulatory Visit: Payer: Self-pay | Admitting: Internal Medicine

## 2015-01-30 ENCOUNTER — Ambulatory Visit: Payer: Self-pay | Admitting: Internal Medicine

## 2015-02-15 NOTE — Telephone Encounter (Signed)
Message was left but call was not returned.

## 2015-06-13 ENCOUNTER — Other Ambulatory Visit: Payer: Self-pay | Admitting: Orthopedic Surgery

## 2015-06-22 ENCOUNTER — Inpatient Hospital Stay (HOSPITAL_COMMUNITY): Admission: RE | Admit: 2015-06-22 | Payer: Self-pay | Source: Ambulatory Visit

## 2015-06-22 ENCOUNTER — Telehealth: Payer: Self-pay | Admitting: General Practice

## 2015-06-25 ENCOUNTER — Inpatient Hospital Stay (HOSPITAL_COMMUNITY)
Admission: RE | Admit: 2015-06-25 | Discharge: 2015-06-25 | Disposition: A | Discharge status: Home / Self Care | Payer: Self-pay | Source: Ambulatory Visit

## 2015-07-03 ENCOUNTER — Inpatient Hospital Stay: Admit: 2015-07-03 | Payer: Medicaid Other | Admitting: Orthopedic Surgery

## 2015-07-03 SURGERY — ARTHROPLASTY, KNEE, UNICOMPARTMENTAL
Anesthesia: General | Laterality: Right

## 2015-07-04 ENCOUNTER — Other Ambulatory Visit (HOSPITAL_COMMUNITY): Payer: Self-pay | Admitting: Specialist

## 2015-07-04 DIAGNOSIS — E0590 Thyrotoxicosis, unspecified without thyrotoxic crisis or storm: Secondary | ICD-10-CM

## 2015-07-11 ENCOUNTER — Encounter (HOSPITAL_COMMUNITY): Admission: RE | Admit: 2015-07-11 | Payer: Medicare Other | Source: Ambulatory Visit

## 2015-07-12 ENCOUNTER — Encounter (HOSPITAL_COMMUNITY): Admission: RE | Admit: 2015-07-12 | Payer: Medicare Other | Source: Ambulatory Visit

## 2015-09-09 ENCOUNTER — Emergency Department (HOSPITAL_COMMUNITY): Payer: Medicare Other

## 2015-09-09 ENCOUNTER — Encounter (HOSPITAL_COMMUNITY): Payer: Self-pay

## 2015-09-09 ENCOUNTER — Emergency Department (HOSPITAL_COMMUNITY)
Admission: EM | Admit: 2015-09-09 | Discharge: 2015-09-09 | Disposition: A | Discharge status: Home / Self Care | Payer: Medicare Other | Attending: Emergency Medicine | Admitting: Emergency Medicine

## 2015-09-09 DIAGNOSIS — F17200 Nicotine dependence, unspecified, uncomplicated: Secondary | ICD-10-CM | POA: Insufficient documentation

## 2015-09-09 DIAGNOSIS — M1990 Unspecified osteoarthritis, unspecified site: Secondary | ICD-10-CM | POA: Diagnosis not present

## 2015-09-09 DIAGNOSIS — J189 Pneumonia, unspecified organism: Secondary | ICD-10-CM | POA: Diagnosis not present

## 2015-09-09 DIAGNOSIS — R05 Cough: Secondary | ICD-10-CM | POA: Insufficient documentation

## 2015-09-09 DIAGNOSIS — E785 Hyperlipidemia, unspecified: Secondary | ICD-10-CM | POA: Diagnosis not present

## 2015-09-09 DIAGNOSIS — F329 Major depressive disorder, single episode, unspecified: Secondary | ICD-10-CM | POA: Diagnosis not present

## 2015-09-09 DIAGNOSIS — Z79899 Other long term (current) drug therapy: Secondary | ICD-10-CM | POA: Insufficient documentation

## 2015-09-09 DIAGNOSIS — M549 Dorsalgia, unspecified: Secondary | ICD-10-CM | POA: Diagnosis present

## 2015-09-09 LAB — CBC WITH DIFFERENTIAL/PLATELET
BASOS ABS: 0 10*3/uL (ref 0.0–0.1)
BASOS PCT: 0 %
EOS ABS: 0.1 10*3/uL (ref 0.0–0.7)
EOS PCT: 1 %
HCT: 33.7 % — ABNORMAL LOW (ref 36.0–46.0)
Hemoglobin: 10.8 g/dL — ABNORMAL LOW (ref 12.0–15.0)
LYMPHS ABS: 2.2 10*3/uL (ref 0.7–4.0)
LYMPHS PCT: 19 %
MCH: 24.8 pg — ABNORMAL LOW (ref 26.0–34.0)
MCHC: 32 g/dL (ref 30.0–36.0)
MCV: 77.5 fL — ABNORMAL LOW (ref 78.0–100.0)
MONO ABS: 1.5 10*3/uL — AB (ref 0.1–1.0)
Monocytes Relative: 13 %
Neutro Abs: 8 10*3/uL — ABNORMAL HIGH (ref 1.7–7.7)
Neutrophils Relative %: 67 %
PLATELETS: 268 10*3/uL (ref 150–400)
RBC: 4.35 MIL/uL (ref 3.87–5.11)
RDW: 16.3 % — AB (ref 11.5–15.5)
WBC: 11.8 10*3/uL — ABNORMAL HIGH (ref 4.0–10.5)

## 2015-09-09 LAB — COMPREHENSIVE METABOLIC PANEL
ALBUMIN: 3.2 g/dL — AB (ref 3.5–5.0)
ALT: 17 U/L (ref 14–54)
AST: 15 U/L (ref 15–41)
Alkaline Phosphatase: 59 U/L (ref 38–126)
Anion gap: 7 (ref 5–15)
BUN: 9 mg/dL (ref 6–20)
CHLORIDE: 106 mmol/L (ref 101–111)
CO2: 22 mmol/L (ref 22–32)
Calcium: 8.6 mg/dL — ABNORMAL LOW (ref 8.9–10.3)
Creatinine, Ser: 0.67 mg/dL (ref 0.44–1.00)
GFR calc Af Amer: >60 mL/min (ref >60)
Glucose, Bld: 112 mg/dL — ABNORMAL HIGH (ref 65–99)
POTASSIUM: 3.1 mmol/L — AB (ref 3.5–5.1)
SODIUM: 135 mmol/L (ref 135–145)
Total Bilirubin: 0.6 mg/dL (ref 0.3–1.2)
Total Protein: 7.1 g/dL (ref 6.5–8.1)

## 2015-09-09 MED ORDER — AZITHROMYCIN 250 MG PO TABS: ORAL_TABLET | ORAL | Status: DC

## 2015-09-09 MED ORDER — ACETAMINOPHEN 325 MG PO TABS
650.0000 mg | ORAL_TABLET | Freq: Once | ORAL | Status: AC | PRN
  Administered 2015-09-09: 650 mg via ORAL
  Filled 2015-09-09: qty 2

## 2015-09-09 MED ORDER — SODIUM CHLORIDE 0.9 % IV BOLUS (SEPSIS)
1000.0000 mL | Freq: Once | INTRAVENOUS | Status: AC
  Administered 2015-09-09: 1000 mL via INTRAVENOUS

## 2015-09-09 MED ORDER — OXYCODONE-ACETAMINOPHEN 5-325 MG PO TABS: 1.0000 | ORAL_TABLET | Freq: Four times a day (QID) | ORAL | Status: DC | PRN

## 2015-09-09 MED ORDER — HYDROMORPHONE HCL 1 MG/ML IJ SOLN: 1.0000 mg | Freq: Once | INTRAMUSCULAR | Status: DC

## 2015-09-09 MED ORDER — AZITHROMYCIN 250 MG PO TABS
500.0000 mg | ORAL_TABLET | Freq: Once | ORAL | Status: AC
  Administered 2015-09-09: 500 mg via ORAL
  Filled 2015-09-09: qty 2

## 2015-09-09 MED ORDER — ACETAMINOPHEN 325 MG PO TABS: 650.0000 mg | ORAL_TABLET | Freq: Once | ORAL | Status: DC

## 2015-09-09 NOTE — ED Notes (Signed)
Pt reports pain in r side and back for the past 2 weeks.  Reports intermittent fever and productive cough with green sputum.  Reports history of pneumonia.

## 2015-09-09 NOTE — Discharge Instructions (Signed)
Follow up with a family md this week 

## 2015-09-09 NOTE — ED Provider Notes (Signed)
CSN: [id number]     Arrival date & time 09/09/15  1656 History   First MD Initiated Contact with Patient 09/09/15 1724     Chief Complaint  Patient presents with  . Back Pain     (Consider location/radiation/quality/duration/timing/severity/associated sxs/prior Treatment) Patient is a 55 y.o. female presenting with back pain. The history is provided by the patient (Patient complains of cough yellow sputum production right upper back pain with fever).  Back Pain Pain location: Upper back on the right side. Quality:  Aching Radiates to:  Does not radiate Pain severity:  Moderate Pain is:  Same all the time Onset quality:  Gradual Timing:  Constant Progression:  Waxing and waning Chronicity:  New Associated symptoms: no abdominal pain, no chest pain and no headaches     Past Medical History  Diagnosis Date  . Knee pain   . Chronic pain   . Wears glasses   . Wears dentures     upper  . GERD (gastroesophageal reflux disease)     occ  . Depression   . Arthritis   . Hyperlipidemia   . Anxiety    Past Surgical History  Procedure Laterality Date  . Total hip arthroplasty  2010    lt hip  . Cesarean section      x2  . Tubal ligation     Family History  Problem Relation Age of Onset  . Kidney disease Mother   . Heart disease Father    Social History  Substance Use Topics  . Smoking status: Current Every Day Smoker -- 1.50 packs/day  . Smokeless tobacco: Never Used  . Alcohol Use: No   OB History    No data available     Review of Systems  Constitutional: Negative for appetite change and fatigue.  HENT: Negative for congestion, ear discharge and sinus pressure.   Eyes: Negative for discharge.  Respiratory: Positive for cough.   Cardiovascular: Negative for chest pain.  Gastrointestinal: Negative for abdominal pain and diarrhea.  Genitourinary: Negative for frequency and hematuria.  Musculoskeletal: Positive for back pain.  Skin: Negative for rash.   Neurological: Negative for seizures and headaches.  Psychiatric/Behavioral: Negative for hallucinations.      Allergies  Codeine  Home Medications   Prior to Admission medications   Medication Sig Start Date End Date Taking? Authorizing Provider  Brexpiprazole (REXULTI) 0.5 MG TABS Take 1 tablet by mouth daily.   Yes Historical Provider, MD  DULoxetine (CYMBALTA) 60 MG capsule Take 60 mg by mouth daily.   Yes Historical Provider, MD  gabapentin (NEURONTIN) 400 MG capsule Take 1 capsule (400 mg total) by mouth 3 (three) times daily. 01/25/15  Yes Oneta Rackanika N Lewis, NP  meloxicam (MOBIC) 15 MG tablet Take 15 mg by mouth daily.   Yes Historical Provider, MD  traZODone (DESYREL) 100 MG tablet Take 1 tablet (100 mg total) by mouth at bedtime. 01/25/15  Yes Oneta Rackanika N Lewis, NP  atorvastatin (LIPITOR) 20 MG tablet Take 1 tablet (20 mg total) by mouth daily. Patient not taking: Reported on 09/09/2015 01/15/15   Elenora GammaSamuel L Bradshaw, MD  azithromycin [facility](ZITHROMAX) 250 MG tablet Take one tablet a day 09/09/15   Bethann BerkshireJoseph Isaiahs Chancy, MD  carisoprodol (SOMA) 350 MG tablet Take 350 mg by mouth 4 (four) times daily as needed for muscle spasms.    Historical Provider, MD  famotidine (PEPCID) 20 MG tablet Take 1 tablet (20 mg total) by mouth 2 (two) times daily. Patient not taking: Reported on 09/09/2015  01/25/15   Oneta Rackanika N Lewis, NP  oxyCODONE-acetaminophen (PERCOCET/ROXICET) 5-325 MG tablet Take 1 tablet by mouth every 6 (six) hours as needed. 09/09/15   Bethann BerkshireJoseph Tyler Cubit, MD   BP 104/62 mmHg  Pulse 84  Temp(Src) 99.8 F (37.7 C) (Oral)  Resp 20  Ht 5\' 3"  (1.6 m)  SpO2 96% Physical Exam  Constitutional: She is oriented to person, place, and time. She appears well-developed.  HENT:  Head: Normocephalic.  Eyes: Conjunctivae and EOM are normal. No scleral icterus.  Neck: Neck supple. No thyromegaly present.  Cardiovascular: Normal rate and regular rhythm.  Exam reveals no gallop and no friction rub.   No murmur  heard. Pulmonary/Chest: No stridor. She has no wheezes. She has no rales. She exhibits no tenderness.  Abdominal: She exhibits no distension. There is no tenderness. There is no rebound.  Musculoskeletal: Normal range of motion. She exhibits no edema.  Lymphadenopathy:    She has no cervical adenopathy.  Neurological: She is oriented to person, place, and time. She exhibits normal muscle tone. Coordination normal.  Skin: No rash noted. No erythema.  Psychiatric: She has a normal mood and affect. Her behavior is normal.    ED Course  Procedures (including critical care time) Labs Review Labs Reviewed  CBC WITH DIFFERENTIAL/PLATELET - Abnormal; Notable for the following:    WBC 11.8 (*)    Hemoglobin 10.8 (*)    HCT 33.7 (*)    MCV 77.5 (*)    MCH 24.8 (*)    RDW 16.3 (*)    Neutro Abs 8.0 (*)    Monocytes Absolute 1.5 (*)    All other components within normal limits  COMPREHENSIVE METABOLIC PANEL - Abnormal; Notable for the following:    Potassium 3.1 (*)    Glucose, Bld 112 (*)    Calcium 8.6 (*)    Albumin 3.2 (*)    All other components within normal limits    Imaging Review Dg Chest 2 View  09/09/2015  CLINICAL DATA:  Back pain, fever. EXAM: CHEST  2 VIEW COMPARISON:  Chest x-ray dated 01/22/2015. FINDINGS: Consolidation within the right upper lobe measures approximately 6 cm greatest dimension, most likely pneumonia. Left lung is clear. Lungs are hyperexpanded. No pleural effusion seen. Heart size is normal. Mild degenerative spurring within the thoracic spine. IMPRESSION: 1. Ill-defined airspace opacity within the right upper lobe, most likely pneumonia. Recommend follow-up chest x-ray to ensure resolution and thereby exclude the less likely possibility of neoplastic process. 2. Hyperexpanded lungs suggesting COPD. Electronically Signed   By: Bary RichardStan  Maynard M.D.   On: 09/09/2015 18:10   I have personally reviewed and evaluated these images and lab results as part of my  medical decision-making.   EKG Interpretation None      MDM   Final diagnoses:  Community acquired pneumonia    Chest x-ray shows pneumonia patient will be put on Z-Pak and Percocet and will follow-up this week for recheck    Bethann BerkshireJoseph Shafin Pollio, MD 09/09/15 2009

## 2015-09-09 NOTE — ED Notes (Signed)
Explained to patient that we could not give her Dilaudid and send her out the door without a ride to walk home.  Patient is calling several people to see if she can get a ride home.

## 2015-09-10 MED FILL — Oxycodone w/ Acetaminophen Tab 5-325 MG: ORAL | Qty: 6 | Status: AC

## 2015-09-20 ENCOUNTER — Emergency Department (HOSPITAL_COMMUNITY)
Admission: EM | Admit: 2015-09-20 | Discharge: 2015-09-20 | Disposition: A | Discharge status: Home / Self Care | Payer: Medicare Other | Attending: Emergency Medicine | Admitting: Emergency Medicine

## 2015-09-20 ENCOUNTER — Emergency Department (HOSPITAL_COMMUNITY): Payer: Medicare Other

## 2015-09-20 ENCOUNTER — Encounter (HOSPITAL_COMMUNITY): Payer: Self-pay | Admitting: Emergency Medicine

## 2015-09-20 DIAGNOSIS — E785 Hyperlipidemia, unspecified: Secondary | ICD-10-CM | POA: Diagnosis not present

## 2015-09-20 DIAGNOSIS — J189 Pneumonia, unspecified organism: Secondary | ICD-10-CM | POA: Insufficient documentation

## 2015-09-20 DIAGNOSIS — M545 Low back pain: Secondary | ICD-10-CM | POA: Diagnosis not present

## 2015-09-20 DIAGNOSIS — F17200 Nicotine dependence, unspecified, uncomplicated: Secondary | ICD-10-CM | POA: Insufficient documentation

## 2015-09-20 DIAGNOSIS — R05 Cough: Secondary | ICD-10-CM | POA: Diagnosis present

## 2015-09-20 DIAGNOSIS — F329 Major depressive disorder, single episode, unspecified: Secondary | ICD-10-CM | POA: Diagnosis not present

## 2015-09-20 DIAGNOSIS — M1990 Unspecified osteoarthritis, unspecified site: Secondary | ICD-10-CM | POA: Insufficient documentation

## 2015-09-20 DIAGNOSIS — Z79899 Other long term (current) drug therapy: Secondary | ICD-10-CM | POA: Diagnosis not present

## 2015-09-20 DIAGNOSIS — Z79891 Long term (current) use of opiate analgesic: Secondary | ICD-10-CM | POA: Insufficient documentation

## 2015-09-20 DIAGNOSIS — E876 Hypokalemia: Secondary | ICD-10-CM | POA: Diagnosis not present

## 2015-09-20 DIAGNOSIS — J9801 Acute bronchospasm: Secondary | ICD-10-CM | POA: Diagnosis not present

## 2015-09-20 LAB — CBC WITH DIFFERENTIAL/PLATELET
BASOS ABS: 0 10*3/uL (ref 0.0–0.1)
Basophils Relative: 0 %
Eosinophils Absolute: 0.2 10*3/uL (ref 0.0–0.7)
Eosinophils Relative: 1 %
HEMATOCRIT: 29.5 % — AB (ref 36.0–46.0)
Hemoglobin: 9.6 g/dL — ABNORMAL LOW (ref 12.0–15.0)
LYMPHS ABS: 2.2 10*3/uL (ref 0.7–4.0)
LYMPHS PCT: 13 %
MCH: 25.1 pg — AB (ref 26.0–34.0)
MCHC: 32.5 g/dL (ref 30.0–36.0)
MCV: 77.2 fL — AB (ref 78.0–100.0)
MONO ABS: 0.9 10*3/uL (ref 0.1–1.0)
MONOS PCT: 5 %
NEUTROS ABS: 13.2 10*3/uL — AB (ref 1.7–7.7)
Neutrophils Relative %: 81 %
Platelets: 394 10*3/uL (ref 150–400)
RBC: 3.82 MIL/uL — ABNORMAL LOW (ref 3.87–5.11)
RDW: 15.9 % — AB (ref 11.5–15.5)
WBC: 16.6 10*3/uL — ABNORMAL HIGH (ref 4.0–10.5)

## 2015-09-20 LAB — BASIC METABOLIC PANEL
ANION GAP: 12 (ref 5–15)
BUN: 10 mg/dL (ref 6–20)
CALCIUM: 8.9 mg/dL (ref 8.9–10.3)
CO2: 25 mmol/L (ref 22–32)
Chloride: 100 mmol/L — ABNORMAL LOW (ref 101–111)
Creatinine, Ser: 0.54 mg/dL (ref 0.44–1.00)
GFR calc Af Amer: >60 mL/min (ref >60)
GFR calc non Af Amer: >60 mL/min (ref >60)
GLUCOSE: 99 mg/dL (ref 65–99)
Potassium: 2.8 mmol/L — ABNORMAL LOW (ref 3.5–5.1)
Sodium: 137 mmol/L (ref 135–145)

## 2015-09-20 LAB — I-STAT CG4 LACTIC ACID, ED: Lactic Acid, Venous: 0.74 mmol/L (ref 0.5–1.9)

## 2015-09-20 MED ORDER — DIPHENHYDRAMINE HCL 50 MG/ML IJ SOLN
25.0000 mg | Freq: Once | INTRAMUSCULAR | Status: AC
  Administered 2015-09-20: 25 mg via INTRAVENOUS
  Filled 2015-09-20: qty 1

## 2015-09-20 MED ORDER — LEVOFLOXACIN 500 MG PO TABS: 500.0000 mg | ORAL_TABLET | Freq: Every day | ORAL | Status: AC

## 2015-09-20 MED ORDER — POTASSIUM CHLORIDE 10 MEQ/100ML IV SOLN
10.0000 meq | INTRAVENOUS | Status: AC
  Administered 2015-09-20 (×2): 10 meq via INTRAVENOUS
  Filled 2015-09-20 (×2): qty 100

## 2015-09-20 MED ORDER — LEVOFLOXACIN IN D5W 500 MG/100ML IV SOLN
500.0000 mg | Freq: Once | INTRAVENOUS | Status: AC
  Administered 2015-09-20: 500 mg via INTRAVENOUS
  Filled 2015-09-20: qty 100

## 2015-09-20 MED ORDER — SODIUM CHLORIDE 0.9 % IV BOLUS (SEPSIS)
1000.0000 mL | Freq: Once | INTRAVENOUS | Status: AC
  Administered 2015-09-20: 1000 mL via INTRAVENOUS

## 2015-09-20 MED ORDER — POTASSIUM CHLORIDE CRYS ER 20 MEQ PO TBCR
40.0000 meq | EXTENDED_RELEASE_TABLET | Freq: Once | ORAL | Status: DC
  Filled 2015-09-20: qty 2

## 2015-09-20 MED ORDER — DIPHENHYDRAMINE HCL 25 MG PO TABS: 25.0000 mg | ORAL_TABLET | ORAL | Status: DC | PRN

## 2015-09-20 MED ORDER — PREDNISONE 20 MG PO TABS: ORAL_TABLET | ORAL | Status: DC

## 2015-09-20 MED ORDER — ALBUTEROL SULFATE (2.5 MG/3ML) 0.083% IN NEBU
5.0000 mg | INHALATION_SOLUTION | Freq: Once | RESPIRATORY_TRACT | Status: AC
  Administered 2015-09-20: 5 mg via RESPIRATORY_TRACT
  Filled 2015-09-20: qty 6

## 2015-09-20 MED ORDER — METHYLPREDNISOLONE SODIUM SUCC 125 MG IJ SOLR
125.0000 mg | Freq: Once | INTRAMUSCULAR | Status: AC
  Administered 2015-09-20: 125 mg via INTRAVENOUS
  Filled 2015-09-20: qty 2

## 2015-09-20 MED ORDER — ALBUTEROL SULFATE HFA 108 (90 BASE) MCG/ACT IN AERS
1.0000 | INHALATION_SPRAY | RESPIRATORY_TRACT | Status: DC | PRN
  Administered 2015-09-20: 1 via RESPIRATORY_TRACT
  Filled 2015-09-20 (×2): qty 6.7

## 2015-09-20 NOTE — Discharge Instructions (Signed)
Bronchospasm, Adult °A bronchospasm is a spasm or tightening of the airways going into the lungs. During a bronchospasm breathing becomes more difficult because the airways get smaller. When this happens there can be coughing, a whistling sound when breathing (wheezing), and difficulty breathing. Bronchospasm is often associated with asthma, but not all patients who experience a bronchospasm have asthma. °CAUSES  °A bronchospasm is caused by inflammation or irritation of the airways. The inflammation or irritation may be triggered by:  °· Allergies (such as to animals, pollen, food, or mold). Allergens that cause bronchospasm may cause wheezing immediately after exposure or many hours later.   °· Infection. Viral infections are believed to be the most common cause of bronchospasm.   °· Exercise.   °· Irritants (such as pollution, cigarette smoke, strong odors, aerosol sprays, and paint fumes).   °· Weather changes. Winds increase molds and pollens in the air. Rain refreshes the air by washing irritants out. Cold air may cause inflammation.   °· Stress and emotional upset.   °SIGNS AND SYMPTOMS  °· Wheezing.   °· Excessive nighttime coughing.   °· Frequent or severe coughing with a simple cold.   °· Chest tightness.   °· Shortness of breath.   °DIAGNOSIS  °Bronchospasm is usually diagnosed through a history and physical exam. Tests, such as chest X-rays, are sometimes done to look for other conditions. °TREATMENT  °· Inhaled medicines can be given to open up your airways and help you breathe. The medicines can be given using either an inhaler or a nebulizer machine. °· Corticosteroid medicines may be given for severe bronchospasm, usually when it is associated with asthma. °HOME CARE INSTRUCTIONS  °· Always have a plan prepared for seeking medical care. Know when to call your health care provider and local emergency services (911 in the [place] Know where you can access local emergency care. °· Only take medicines as  directed by your health care provider. °· If you were prescribed an inhaler or nebulizer machine, ask your health care provider to explain how to use it correctly. Always use a spacer with your inhaler if you were given one. °· It is necessary to remain calm during an attack. Try to relax and breathe more slowly.  °· Control your home environment in the following ways:   °· Change your heating and air conditioning filter at least once a month.   °· Limit your use of fireplaces and wood stoves. °· Do not smoke and do not allow smoking in your home.   °· Avoid exposure to perfumes and fragrances.   °· Get rid of pests (such as roaches and mice) and their droppings.   °· Throw away plants if you see mold on them.   °· Keep your house clean and dust free.   °· Replace carpet with wood, tile, or vinyl flooring. Carpet can trap dander and dust.   °· Use allergy-proof pillows, mattress covers, and box spring covers.   °· Wash bed sheets and blankets every week in hot water and dry them in a dryer.   °· Use blankets that are made of polyester or cotton.   °· Wash hands frequently. °SEEK MEDICAL CARE IF:  °· You have muscle aches.   °· You have chest pain.   °· The sputum changes from clear or white to yellow, green, gray, or bloody.   °· The sputum you cough up gets thicker.   °· There are problems that may be related to the medicine you are given, such as a rash, itching, swelling, or trouble breathing.   °SEEK IMMEDIATE MEDICAL CARE IF:  °· You have worsening wheezing and coughing   even after taking your prescribed medicines.   °· You have increased difficulty breathing.   °· You develop severe chest pain. °MAKE SURE YOU:  °· Understand these instructions. °· Will watch your condition. °· Will get help right away if you are not doing well or get worse. °  °This information is not intended to replace advice given to you by your health care provider. Make sure you discuss any questions you have with your health care  provider. °  °Document Released: 03/06/2003 Document Revised: 03/24/2014 Document Reviewed: 08/23/2012 °Elsevier Interactive Patient Education ©2016 [employer] °Community-Acquired Pneumonia, Adult °Pneumonia is an infection of the lungs. There are different types of pneumonia. One type can develop while a person is in a hospital. A different type, called community-acquired pneumonia, develops in people who are not, or have not recently been, in the hospital or other health care facility.  °CAUSES °Pneumonia may be caused by bacteria, viruses, or funguses. Community-acquired pneumonia is often caused by Streptococcus pneumonia bacteria. These bacteria are often passed from one person to another by breathing in droplets from the cough or sneeze of an infected person. °RISK FACTORS °The condition is more likely to develop in: °· People who have chronic diseases, such as chronic obstructive pulmonary disease (COPD), asthma, congestive heart failure, cystic fibrosis, diabetes, or kidney disease. °· People who have early-stage or late-stage HIV. °· People who have sickle cell disease. °· People who have had their spleen removed (splenectomy). °· People who have poor dental hygiene. °· People who have medical conditions that increase the risk of breathing in (aspirating) secretions their own mouth and nose.   °· People who have a weakened immune system (immunocompromised). °· People who smoke. °· People who travel to areas where pneumonia-causing germs commonly exist. °· People who are around animal habitats or animals that have pneumonia-causing germs, including birds, bats, rabbits, cats, and farm animals. °SYMPTOMS °Symptoms of this condition include: °· A dry cough. °· A wet (productive) cough. °· Fever. °· Sweating. °· Chest pain, especially when breathing deeply or coughing. °· Rapid breathing or difficulty breathing. °· Shortness of breath. °· Shaking chills. °· Fatigue. °· Muscle aches. °DIAGNOSIS °Your health  care provider will take a medical history and perform a physical exam. You may also have other tests, including: °· Imaging studies of your chest, including X-rays. °· Tests to check your blood oxygen level and other blood gases. °· Other tests on blood, mucus (sputum), fluid around your lungs (pleural fluid), and urine. °If your pneumonia is severe, other tests may be done to identify the specific cause of your illness. °TREATMENT °The type of treatment that you receive depends on many factors, such as the cause of your pneumonia, the medicines you take, and other medical conditions that you have. For most adults, treatment and recovery from pneumonia may occur at home. In some cases, treatment must happen in a hospital. Treatment may include: °· Antibiotic medicines, if the pneumonia was caused by bacteria. °· Antiviral medicines, if the pneumonia was caused by a virus. °· Medicines that are given by mouth or through an IV tube. °· Oxygen. °· Respiratory therapy. °Although rare, treating severe pneumonia may include: °· Mechanical ventilation. This is done if you are not breathing well on your own and you cannot maintain a safe blood oxygen level. °· Thoracentesis. This procedure removes fluid around one lung or both lungs to help you breathe better. °HOME CARE INSTRUCTIONS °· Take over-the-counter and prescription medicines only as told by your health care provider. °¨ Only take cough medicine if you are losing sleep.   Understand that cough medicine can prevent your body's natural ability to remove mucus from your lungs. °¨ If you were prescribed an antibiotic medicine, take it as told by your health care provider. Do not stop taking the antibiotic even if you start to feel better. °· Sleep in a semi-upright position at night. Try sleeping in a reclining chair, or place a few pillows under your head. °· Do not use tobacco products, including cigarettes, chewing tobacco, and e-cigarettes. If you need help quitting,  ask your health care provider. °· Drink enough water to keep your urine clear or pale yellow. This will help to thin out mucus secretions in your lungs. °PREVENTION °There are ways that you can decrease your risk of developing community-acquired pneumonia. Consider getting a pneumococcal vaccine if: °· You are older than 56 years of age. °· You are older than 56 years of age and are undergoing cancer treatment, have chronic lung disease, or have other medical conditions that affect your immune system. Ask your health care provider if this applies to you. °There are different types and schedules of pneumococcal vaccines. Ask your health care provider which vaccination option is best for you. °You may also prevent community-acquired pneumonia if you take these actions: °· Get an influenza vaccine every year. Ask your health care provider which type of influenza vaccine is best for you. °· Go to the dentist on a regular basis. °· Wash your hands often. Use hand sanitizer if soap and water are not available. °SEEK MEDICAL CARE IF: °· You have a fever. °· You are losing sleep because you cannot control your cough with cough medicine. °SEEK IMMEDIATE MEDICAL CARE IF: °· You have worsening shortness of breath. °· You have increased chest pain. °· Your sickness becomes worse, especially if you are an older adult or have a weakened immune system. °· You cough up blood. °  °This information is not intended to replace advice given to you by your health care provider. Make sure you discuss any questions you have with your health care provider. °  °Document Released: 03/03/2005 Document Revised: 11/22/2014 Document Reviewed: 06/28/2014 °Elsevier Interactive Patient Education ©2016 [employer] ° °

## 2015-09-20 NOTE — ED Notes (Signed)
Pt has a generalized rash that happened after taking medication given on 6/25

## 2015-09-20 NOTE — ED Provider Notes (Signed)
CSN: [id number]     Arrival date & time 09/20/15  1448 History   First MD Initiated Contact with Patient 09/20/15 1554     Chief Complaint  Patient presents with  . Cough     (Consider location/radiation/quality/duration/timing/severity/associated sxs/prior Treatment) HPI Patient was seen on 6/25 and diagnosed with pneumonia. Given Z-Pak which the patient states she finished. She has persistent coughing which is nonproductive. She also complains of wheezing and ongoing shortness of breath. She's had no fever or chills. Patient did notice that she broke out in a rash after starting the antibiotic. The rashes diffuse covering extremities and trunk. No known insect or tick bites. Patient states the rash itches she's been scratching. She states that the rash has improved over the last few days. Past Medical History  Diagnosis Date  . Knee pain   . Chronic pain   . Wears glasses   . Wears dentures     upper  . GERD (gastroesophageal reflux disease)     occ  . Depression   . Arthritis   . Hyperlipidemia   . Anxiety    Past Surgical History  Procedure Laterality Date  . Total hip arthroplasty  2010    lt hip  . Cesarean section      x2  . Tubal ligation     Family History  Problem Relation Age of Onset  . Kidney disease Mother   . Heart disease Father    Social History  Substance Use Topics  . Smoking status: Current Some Day Smoker -- 0.50 packs/day  . Smokeless tobacco: Never Used  . Alcohol Use: No   OB History    No data available     Review of Systems  Constitutional: Negative for fever and chills.  Respiratory: Positive for cough, shortness of breath and wheezing.   Cardiovascular: Negative for chest pain.  Gastrointestinal: Negative for nausea, vomiting, abdominal pain and diarrhea.  Musculoskeletal: Positive for myalgias and back pain. Negative for neck pain and neck stiffness.  Skin: Positive for rash. Negative for wound.  Neurological: Negative for dizziness,  weakness, light-headedness, numbness and headaches.  All other systems reviewed and are negative.     Allergies  Codeine  Home Medications   Prior to Admission medications   Medication Sig Start Date End Date Taking? Authorizing Provider  atorvastatin (LIPITOR) 20 MG tablet Take 1 tablet (20 mg total) by mouth daily. 01/15/15  Yes Elenora GammaSamuel L Bradshaw, MD  Brexpiprazole (REXULTI) 1 MG TABS Take 1 mg by mouth daily.   Yes Historical Provider, MD  DULoxetine (CYMBALTA) 60 MG capsule Take 60 mg by mouth daily.   Yes Historical Provider, MD  gabapentin (NEURONTIN) 800 MG tablet Take 1,600 mg by mouth 3 (three) times daily.   Yes Historical Provider, MD  ipratropium-albuterol (DUONEB) 0.5-2.5 (3) MG/3ML SOLN Take 3 mLs by nebulization every 4 (four) hours as needed (shortness of breath).   Yes Historical Provider, MD  meloxicam (MOBIC) 15 MG tablet Take 15 mg by mouth daily.   Yes Historical Provider, MD  oxyCODONE-acetaminophen (PERCOCET/ROXICET) 5-325 MG tablet Take 1 tablet by mouth every 6 (six) hours as needed. 09/09/15  Yes Bethann BerkshireJoseph Zammit, MD  Potassium 99 MG TABS Take 1 tablet by mouth daily.   Yes Historical Provider, MD  propranolol (INDERAL) 10 MG tablet Take 10 mg by mouth daily.   Yes Historical Provider, MD  topiramate (TOPAMAX) 50 MG tablet Take 50 mg by mouth 2 (two) times daily.   Yes Historical  Provider, MD  azithromycin (ZITHROMAX) 250 MG tablet Take one tablet a day Patient not taking: Reported on 09/20/2015 09/09/15   Bethann BerkshireJoseph Zammit, MD  diphenhydrAMINE (BENADRYL) 25 MG tablet Take 1 tablet (25 mg total) by mouth every 4 (four) hours as needed for itching. 09/20/15   Loren Raceravid Rebecah Dangerfield, MD  levofloxacin (LEVAQUIN) 500 MG tablet Take 1 tablet (500 mg total) by mouth daily. 09/21/15 09/30/15  Loren Raceravid Donnamae Muilenburg, MD  predniSONE (DELTASONE) 20 MG tablet 3 tabs po day one, then 2 po daily x 4 days 09/20/15   Loren Raceravid Brexton Sofia, MD   BP 121/84 mmHg  Pulse 83  Temp(Src) 98.6 F (37 C) (Oral)  Resp 20   Ht 5\' 2"  (1.575 m)  Wt 152 lb (68.947 kg)  BMI 27.79 kg/m2  SpO2 100% Physical Exam  Constitutional: She is oriented to person, place, and time. She appears well-developed and well-nourished. No distress.  HENT:  Head: Normocephalic and atraumatic.  Mouth/Throat: Oropharynx is clear and moist. No oropharyngeal exudate.  Eyes: EOM are normal. Pupils are equal, round, and reactive to light.  Neck: Normal range of motion. Neck supple. No JVD present.  No meningismus  Cardiovascular: Normal rate and regular rhythm.  Exam reveals no gallop and no friction rub.   No murmur heard. Pulmonary/Chest: Effort normal. No respiratory distress. She has wheezes (patient with expiratory wheezing throughout. She has scattered rhonchi especially in the right upper lobe.). She has no rales.  Abdominal: Soft. Bowel sounds are normal. She exhibits no distension and no mass. There is no tenderness. There is no rebound and no guarding.  Musculoskeletal: Normal range of motion. She exhibits no edema or tenderness.  No lower extremity swelling or asymmetry. Diffuse thoracic muscular tenderness to palpation.  Neurological: She is alert and oriented to person, place, and time.  5/5 motor in all extremities. Sensation is fully intact.  Skin: Skin is warm and dry. Rash noted. No erythema.  Patient with erythematous papular rash to the bilateral upper and lower extremities as well as the abdomen and low back. The lesions have been excoriated and many are crusted over. No linear patterns.  Psychiatric: She has a normal mood and affect. Her behavior is normal.  Nursing note and vitals reviewed.   ED Course  Procedures (including critical care time) Labs Review Labs Reviewed  CBC WITH DIFFERENTIAL/PLATELET - Abnormal; Notable for the following:    WBC 16.6 (*)    RBC 3.82 (*)    Hemoglobin 9.6 (*)    HCT 29.5 (*)    MCV 77.2 (*)    MCH 25.1 (*)    RDW 15.9 (*)    Neutro Abs 13.2 (*)    All other components  within normal limits  BASIC METABOLIC PANEL - Abnormal; Notable for the following:    Potassium 2.8 (*)    Chloride 100 (*)    All other components within normal limits  I-STAT CG4 LACTIC ACID, ED    Imaging Review Dg Chest 2 View  09/20/2015  CLINICAL DATA:  History of pneumonia diagnosed 09/09/2015, cough EXAM: CHEST  2 VIEW COMPARISON:  09/09/2015 FINDINGS: There is persistent right upper lobe airspace disease which has improved compared with 09/09/2015. There is no other focal parenchymal opacity. There is no pleural effusion or pneumothorax. The heart and mediastinal contours are unremarkable. The osseous structures are unremarkable. IMPRESSION: Persistent right upper lobe pneumonia which has improved compared with 09/09/2015. Followup PA and lateral chest X-ray is recommended in 3-4 weeks to ensure resolution  and exclude underlying malignancy. Electronically Signed   By: Elige Ko   On: 09/20/2015 15:14   I have personally reviewed and evaluated these images and lab results as part of my medical decision-making.   EKG Interpretation None      MDM   Final diagnoses:  CAP (community acquired pneumonia)  Hypokalemia  Bronchospasm    Patient is afebrile and in no respiratory distress. She does have diffuse wheezing on exam. X-ray with right upper lobe infiltrate which is improved from when she was evaluated on 6/25. We'll treat for bronchospasm and likely switch to a broader spectrum antibiotic given her ongoing symptoms.  Patient states she is feeling much better. Bronchospasm has improved. Given albuterol inhaler in the emergency department. Potassium replaced in the emergency department. We'll switch to Levaquin and a short course of steroids. Return precautions have been given. Advised to follow with her primary physician on Monday.  Loren Racer, MD 09/21/15 [phone]

## 2015-09-20 NOTE — ED Notes (Signed)
Pt states she was here 6/25 and diagnosis with pneumonia, pt states she is not feeling any better, has cough with dark green sputum

## 2015-09-20 NOTE — ED Notes (Signed)
Pt alert & oriented x4, stable gait. Patient given discharge instructions, paperwork & prescription(s).  Patient verbalized understanding. Pt left department in wheelchair. Pt left  w/ no further questions. 

## 2015-09-20 NOTE — ED Notes (Signed)
Call Clinic Care coordinator when pt is discharged.  Berta MinorStephanie Williams LPN [phone], she will give her a ride home. Pt come in by EMS

## 2015-09-20 NOTE — ED Notes (Signed)
Pt given food and beverage at this time.

## 2015-11-02 ENCOUNTER — Telehealth: Payer: Self-pay

## 2015-11-02 NOTE — Telephone Encounter (Signed)
Patient called to make an appt for September and stated to operator that she has a home health nurse that comes out to see her and the nurse was concerned that her left leg was swollen and hard. The home health nurse advised patient that she should be seen as soon as she could. I discussed the possibility of a blood clot in the leg with the patient and how dangerous and potentially fatal they could be. Patient agreed to come in earlier but states she has to use [facility] and they wouldn't be able to bring her until this Wednesday the 23rd. Patient states that [facility] [place] [facility] and I suggested she should go through the ER and have leg evaluated but patient declined. Gave appt for 23rd per patients request but also let her know that if her condition worsens or anything changes she should get to the nearest ER for evaluation. Patient verbalized understanding.

## 2015-11-07 ENCOUNTER — Encounter: Payer: Self-pay | Admitting: Pediatrics

## 2015-11-07 ENCOUNTER — Ambulatory Visit (INDEPENDENT_AMBULATORY_CARE_PROVIDER_SITE_OTHER): Payer: Medicare Other | Admitting: Pediatrics

## 2015-11-07 VITALS — BP 99/70 | HR 61 | Temp 96.8°F | Ht 63.0 in | Wt 158.0 lb

## 2015-11-07 DIAGNOSIS — I739 Peripheral vascular disease, unspecified: Secondary | ICD-10-CM

## 2015-11-07 DIAGNOSIS — E785 Hyperlipidemia, unspecified: Secondary | ICD-10-CM

## 2015-11-07 DIAGNOSIS — F411 Generalized anxiety disorder: Secondary | ICD-10-CM

## 2015-11-07 DIAGNOSIS — E876 Hypokalemia: Secondary | ICD-10-CM | POA: Diagnosis not present

## 2015-11-07 DIAGNOSIS — F1411 Cocaine abuse, in remission: Secondary | ICD-10-CM

## 2015-11-07 DIAGNOSIS — M179 Osteoarthritis of knee, unspecified: Secondary | ICD-10-CM

## 2015-11-07 DIAGNOSIS — Z720 Tobacco use: Secondary | ICD-10-CM | POA: Diagnosis not present

## 2015-11-07 DIAGNOSIS — F331 Major depressive disorder, recurrent, moderate: Secondary | ICD-10-CM

## 2015-11-07 DIAGNOSIS — R238 Other skin changes: Secondary | ICD-10-CM | POA: Diagnosis not present

## 2015-11-07 DIAGNOSIS — F1410 Cocaine abuse, uncomplicated: Secondary | ICD-10-CM | POA: Diagnosis not present

## 2015-11-07 DIAGNOSIS — R51 Headache: Secondary | ICD-10-CM

## 2015-11-07 DIAGNOSIS — R519 Headache, unspecified: Secondary | ICD-10-CM

## 2015-11-07 DIAGNOSIS — R8290 Unspecified abnormal findings in urine: Secondary | ICD-10-CM | POA: Diagnosis not present

## 2015-11-07 DIAGNOSIS — F17210 Nicotine dependence, cigarettes, uncomplicated: Secondary | ICD-10-CM

## 2015-11-07 DIAGNOSIS — R296 Repeated falls: Secondary | ICD-10-CM

## 2015-11-07 DIAGNOSIS — R233 Spontaneous ecchymoses: Secondary | ICD-10-CM | POA: Insufficient documentation

## 2015-11-07 DIAGNOSIS — M1711 Unilateral primary osteoarthritis, right knee: Secondary | ICD-10-CM

## 2015-11-07 DIAGNOSIS — M1990 Unspecified osteoarthritis, unspecified site: Secondary | ICD-10-CM | POA: Insufficient documentation

## 2015-11-07 LAB — URINALYSIS, COMPLETE
BILIRUBIN UA: NEGATIVE
GLUCOSE, UA: NEGATIVE
KETONES UA: NEGATIVE
NITRITE UA: NEGATIVE
PROTEIN UA: NEGATIVE
RBC UA: NEGATIVE
SPEC GRAV UA: 1.01 (ref 1.005–1.030)
UUROB: 0.2 mg/dL (ref 0.2–1.0)
pH, UA: 7 (ref 5.0–7.5)

## 2015-11-07 LAB — MICROSCOPIC EXAMINATION
Epithelial Cells (non renal): >10 /hpf — AB (ref 0–10)
RBC MICROSCOPIC, UA: NONE SEEN /HPF (ref 0–?)

## 2015-11-07 MED ORDER — FLUTICASONE FUROATE-VILANTEROL 200-25 MCG/INH IN AEPB: 1.0000 | INHALATION_SPRAY | Freq: Every day | RESPIRATORY_TRACT | 1 refills | Status: DC

## 2015-11-07 MED ORDER — IPRATROPIUM-ALBUTEROL 0.5-2.5 (3) MG/3ML IN SOLN: 3.0000 mL | RESPIRATORY_TRACT | 1 refills | Status: AC | PRN

## 2015-11-07 MED ORDER — MELOXICAM 15 MG PO TABS: 15.0000 mg | ORAL_TABLET | Freq: Every day | ORAL | 1 refills | Status: DC

## 2015-11-07 MED ORDER — PROPRANOLOL HCL 10 MG PO TABS: 10.0000 mg | ORAL_TABLET | Freq: Every day | ORAL | 1 refills | Status: AC

## 2015-11-07 MED ORDER — TOPIRAMATE 50 MG PO TABS: 50.0000 mg | ORAL_TABLET | Freq: Two times a day (BID) | ORAL | 1 refills | Status: DC

## 2015-11-07 MED ORDER — HYDROXYZINE HCL 50 MG PO TABS: 50.0000 mg | ORAL_TABLET | Freq: Three times a day (TID) | ORAL | 1 refills | Status: AC | PRN

## 2015-11-07 MED ORDER — DULOXETINE HCL 60 MG PO CPEP: 60.0000 mg | ORAL_CAPSULE | Freq: Every day | ORAL | 1 refills | Status: DC

## 2015-11-07 MED ORDER — POTASSIUM 99 MG PO TABS: 1.0000 | ORAL_TABLET | Freq: Every day | ORAL | 1 refills | Status: AC

## 2015-11-07 MED ORDER — BREXPIPRAZOLE 1 MG PO TABS: 1.0000 mg | ORAL_TABLET | Freq: Every day | ORAL | 1 refills | Status: AC

## 2015-11-07 MED ORDER — ATORVASTATIN CALCIUM 20 MG PO TABS: 20.0000 mg | ORAL_TABLET | Freq: Every day | ORAL | 1 refills | Status: DC

## 2015-11-07 NOTE — Progress Notes (Signed)
Subjective:   Patient ID: Summer Estrada, female    DOB: 06-02-1960, 55 y.o.   MRN: [id number] CC: Left leg swollen and Foul smelling urine  HPI: Summer Estrada is a 55 y.o. female presenting for Left leg swollen and Foul smelling urine  Here to re-establish care Dr. Alphonzo Grieve at [facility] was following her in [place] for PCP care and prescribing all of her chronic medications  Now living in [place] again, says she needs PCP here Might be moving to [place] permanently, has one way ticket there leaving in one week Daughter lives out there and is getting married later this fall  Has burning and pain in L foot when she walks more than 2 blocks Feels like she is veering to R regularly Using a cane Had a nurse from [facility] come out and tell her to get PAD testing in L foot, had measurements written down, LEFT: 0.24, Right: 1.41 Falling regularly, feels "clumsy" On same medicines for a while No new ones No swelling L leg anymore, reason for today's appt originally  Has dysuria in the mornings only Smells at times Ongoing for several days  Denies recent cocaine use, says has been clean over ten months  Smokes cigarettes daily, not interested in quitting now  Relevant past medical, surgical, family and social history reviewed. Allergies and medications reviewed and updated. History  Smoking Status  . Current Some Day Smoker  . Packs/day: 0.50  Smokeless Tobacco  . Never Used   ROS: Per HPI   Objective:    BP 99/70   Pulse 61   Temp (!) 96.8 F (36 C) (Oral)   Ht '5\' 3"'$  (1.6 m)   Wt 158 lb (71.7 kg)   BMI 27.99 kg/m   Wt Readings from Last 3 Encounters:  11/07/15 158 lb (71.7 kg)  09/20/15 152 lb (68.9 kg)  01/15/15 153 lb (69.4 kg)    Gen: NAD, alert, cooperative with exam, NCAT EYES: EOMI, no conjunctival injection, or no icterus CV: NRRR, normal S1/S2, no murmur, distal pulses 2+ b/l Resp: CTABL, no wheezes, normal WOB Abd: +BS,  soft, NTND. no guarding or organomegaly Ext: No edema, warm Neuro: Alert and oriented, strength equal b/l UE and LE, coordination grossly normal MSK: normal muscle bulk Psych: feels safe at home, no thoughts of self harm, excited about visiting daughter next week  Assessment & Plan:  Summer Estrada was seen today for foul smelling urine, multiple med problems. Gave printed Rx for below medications. Not able to refill beyond the Rx given today without follow up appt. Pt needs to establish care with PCP in [place] if she is going to live there or RTC here or follow with Dr. Alphonzo Grieve in [place]. Discussed this with pt, pt is aware.  Diagnoses and all orders for this visit:  Foul smelling urine UA unremarkable, will send for culture -     Urinalysis, Complete -     Urine culture  Cigarette smoker Not interested in quitting Breathing has been fine Cont current meds -     fluticasone furoate-vilanterol (BREO ELLIPTA) 200-25 MCG/INH AEPB; Inhale 1 puff into the lungs daily. -     ipratropium-albuterol (DUONEB) 0.5-2.5 (3) MG/3ML SOLN; Take 3 mLs by nebulization every 4 (four) hours as needed (shortness of breath).  Hyperlipidemia Cont current med -     atorvastatin (LIPITOR) 20 MG tablet; Take 1 tablet (20 mg total) by mouth daily.  Depression, major, recurrent, moderate ([place]) Pt says  well controlled now  No thoughts of self harm Says she has not been in care of psychiatrist in a while, though several ntoes in Epic have at least encourgaed her to establish care with one She thinks move to [place] will be good for her, be closer to family -     Brexpiprazole (REXULTI) 1 MG TABS; Take 1 mg by mouth daily. -     DULoxetine (CYMBALTA) 60 MG capsule; Take 1 capsule (60 mg total) by mouth daily. -     hydrOXYzine (ATARAX/VISTARIL) 50 MG tablet; Take 1 tablet (50 mg total) by mouth 3 (three) times daily as needed.  GAD (generalized anxiety disorder) Well controlled now per pt -     propranolol (INDERAL) 10  MG tablet; Take 1 tablet (10 mg total) by mouth daily.  Osteoarthritis of right knee, unspecified osteoarthritis type Says ortho has wanted to do knee replacement, pt says she wants to wait Has been on tramadol prescribed by her PCP in [place] Had refill last week Discussed needs to establish care with PCP, either here or in [place], they should be the physician to prescribe that medication Unless she is following here regularly and goes through pain contract with [place] Will do Utox today Pt with h/o cocaine use as recently as last fall -     meloxicam (MOBIC) 15 MG tablet; Take 1 tablet (15 mg total) by mouth daily.  Hypokalemia -     Potassium 99 MG TABS; Take 1 tablet (99 mg total) by mouth daily. -     BMP8+EGFR  Nonintractable headache, unspecified chronicity pattern, unspecified headache type Still with headaches Pt needs to discuss with PCP she establishes with -     topiramate (TOPAMAX) 50 MG tablet; Take 1 tablet (50 mg total) by mouth 2 (two) times daily.  H/O cocaine abuse -     ToxASSURE Select 39 (MW), Urine  Easy bruisability -     CBC with Differential/Platelet  Frequent falls Needs to discuss with PCP she establishes with  Other orders -     Microscopic Examination  Claudication Needs ABI repeated, evaluation by vascular Pt leaving town in 1 week Will refer if pt is here, discussed pt needs to find provider in [place] if she stays there for further eval  Follow up plan: Return in about 3 months (around 01/24/2016). Assunta Found, MD [place]

## 2015-11-07 NOTE — Patient Instructions (Addendum)
You need to see vascular doctor for the leg pain and ankle-brachial indices that were decreased in Left leg. Find a doctor in [place] as soon as you get there.  Also need to talk with doctor about your frequent falls and easy bruising

## 2015-11-09 ENCOUNTER — Other Ambulatory Visit: Payer: Self-pay | Admitting: Pediatrics

## 2015-11-09 DIAGNOSIS — N3000 Acute cystitis without hematuria: Secondary | ICD-10-CM

## 2015-11-09 LAB — URINE CULTURE

## 2015-11-09 MED ORDER — SULFAMETHOXAZOLE-TRIMETHOPRIM 800-160 MG PO TABS: 1.0000 | ORAL_TABLET | Freq: Two times a day (BID) | ORAL | 0 refills | Status: AC

## 2015-11-15 LAB — TOXASSURE SELECT 13 (MW), URINE: PDF: 0

## 2015-12-28 ENCOUNTER — Other Ambulatory Visit: Payer: Self-pay | Admitting: Pediatrics

## 2015-12-28 DIAGNOSIS — R519 Headache, unspecified: Secondary | ICD-10-CM

## 2015-12-28 DIAGNOSIS — R51 Headache: Principal | ICD-10-CM

## 2015-12-28 NOTE — Telephone Encounter (Signed)
I am filling 90 day supply, will not be able to fill any more. Needs to find physician in [place].

## 2016-01-02 ENCOUNTER — Other Ambulatory Visit: Payer: Self-pay | Admitting: Family

## 2016-02-06 ENCOUNTER — Other Ambulatory Visit: Payer: Self-pay | Admitting: Pediatrics

## 2016-02-06 DIAGNOSIS — F331 Major depressive disorder, recurrent, moderate: Secondary | ICD-10-CM

## 2016-02-06 DIAGNOSIS — R51 Headache: Secondary | ICD-10-CM

## 2016-02-06 DIAGNOSIS — M1711 Unilateral primary osteoarthritis, right knee: Secondary | ICD-10-CM

## 2016-02-06 DIAGNOSIS — F17210 Nicotine dependence, cigarettes, uncomplicated: Secondary | ICD-10-CM

## 2016-02-06 DIAGNOSIS — R519 Headache, unspecified: Secondary | ICD-10-CM

## 2016-03-02 ENCOUNTER — Other Ambulatory Visit: Payer: Self-pay | Admitting: Family Medicine

## 2016-03-02 DIAGNOSIS — F17210 Nicotine dependence, cigarettes, uncomplicated: Secondary | ICD-10-CM

## 2016-03-16 ENCOUNTER — Other Ambulatory Visit: Payer: Self-pay | Admitting: Pediatrics

## 2016-03-16 DIAGNOSIS — E785 Hyperlipidemia, unspecified: Secondary | ICD-10-CM

## 2016-06-10 ENCOUNTER — Other Ambulatory Visit: Payer: Self-pay | Admitting: Pediatrics

## 2016-06-10 DIAGNOSIS — E785 Hyperlipidemia, unspecified: Secondary | ICD-10-CM

## 2016-10-07 IMAGING — CR DG CHEST 2V
2 series · 2 of 2 positions shown · non-contrast
Comparison: None.

CLINICAL DATA: Chest pain, bilateral flank pain

EXAM:
CHEST  2 VIEW

[w chest lat]
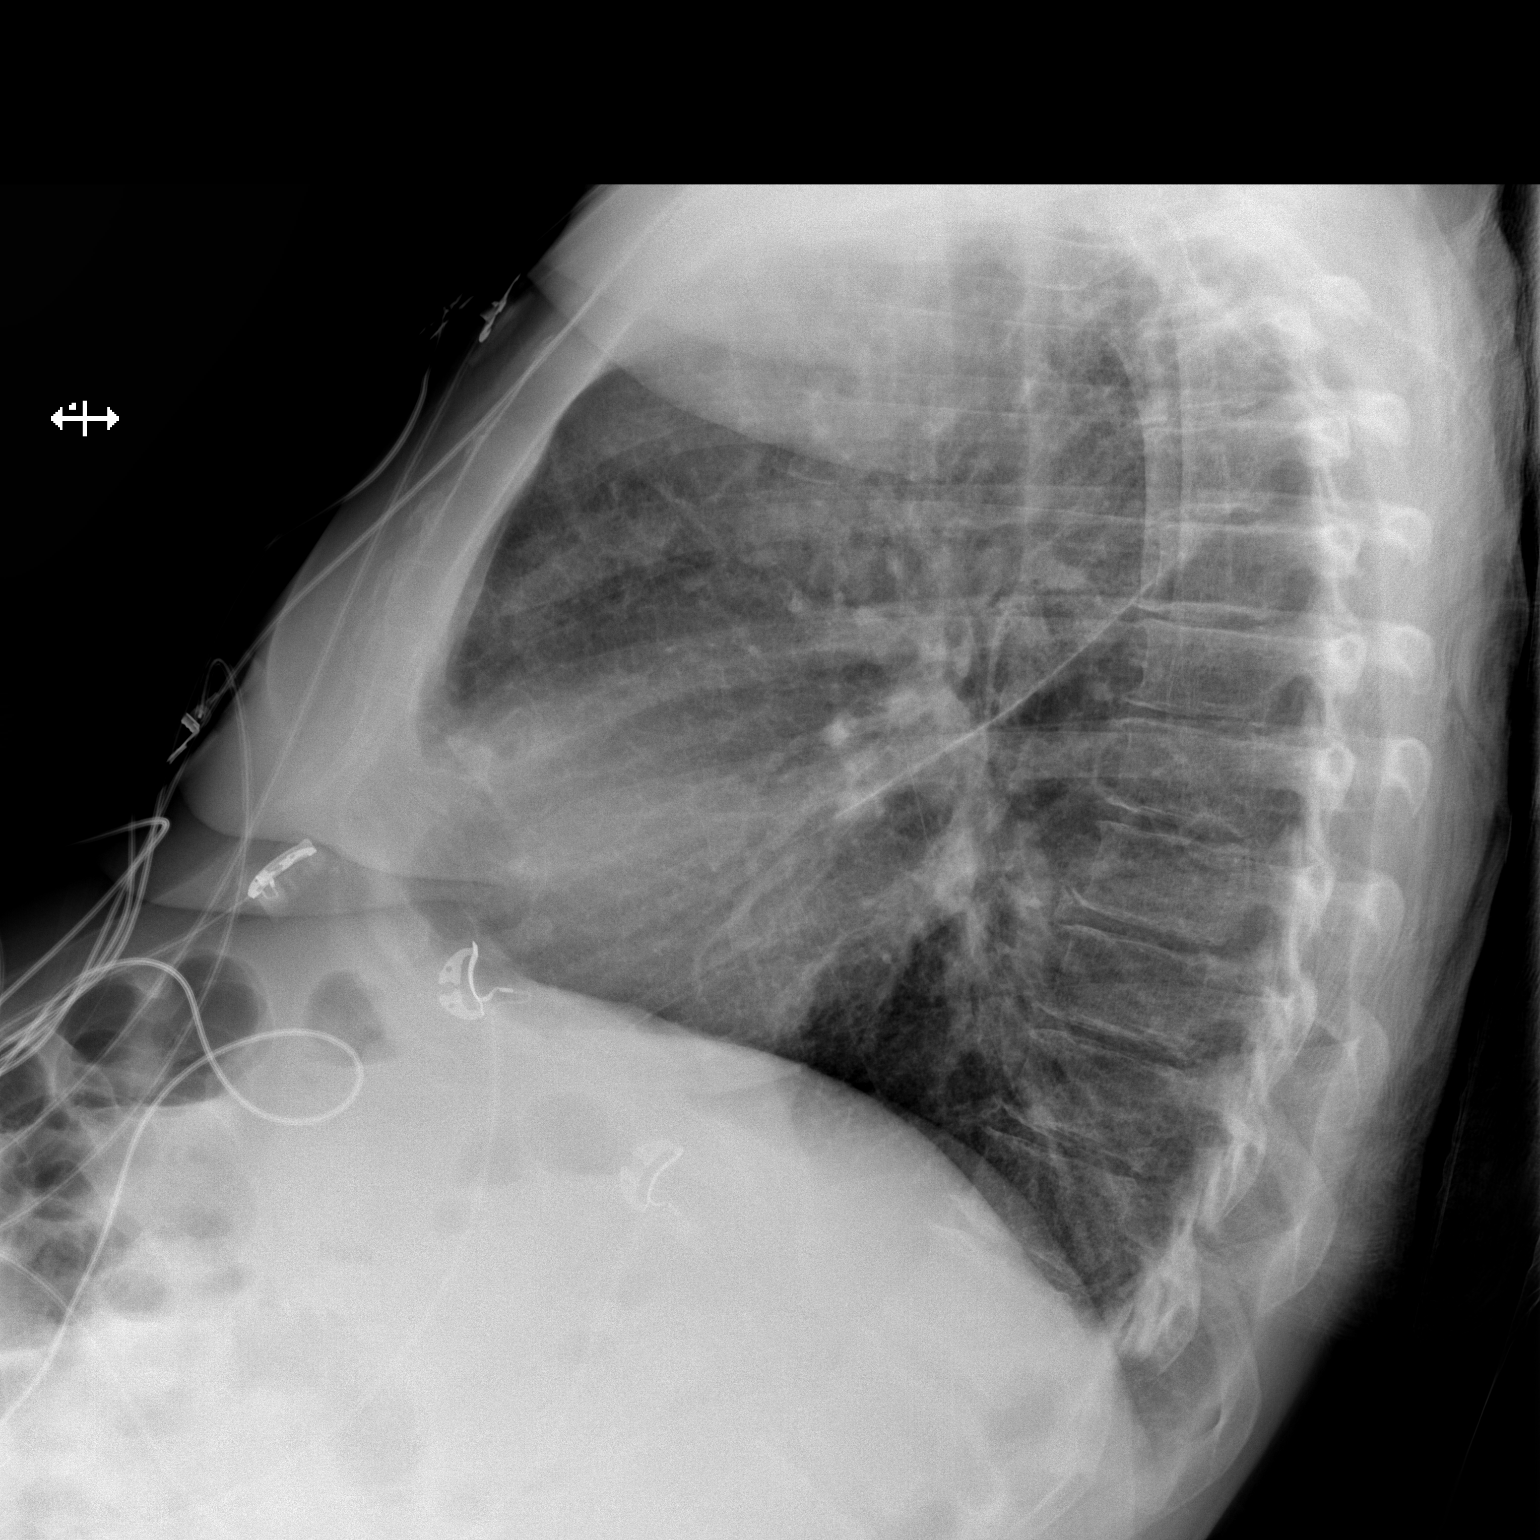

[x chest ap]
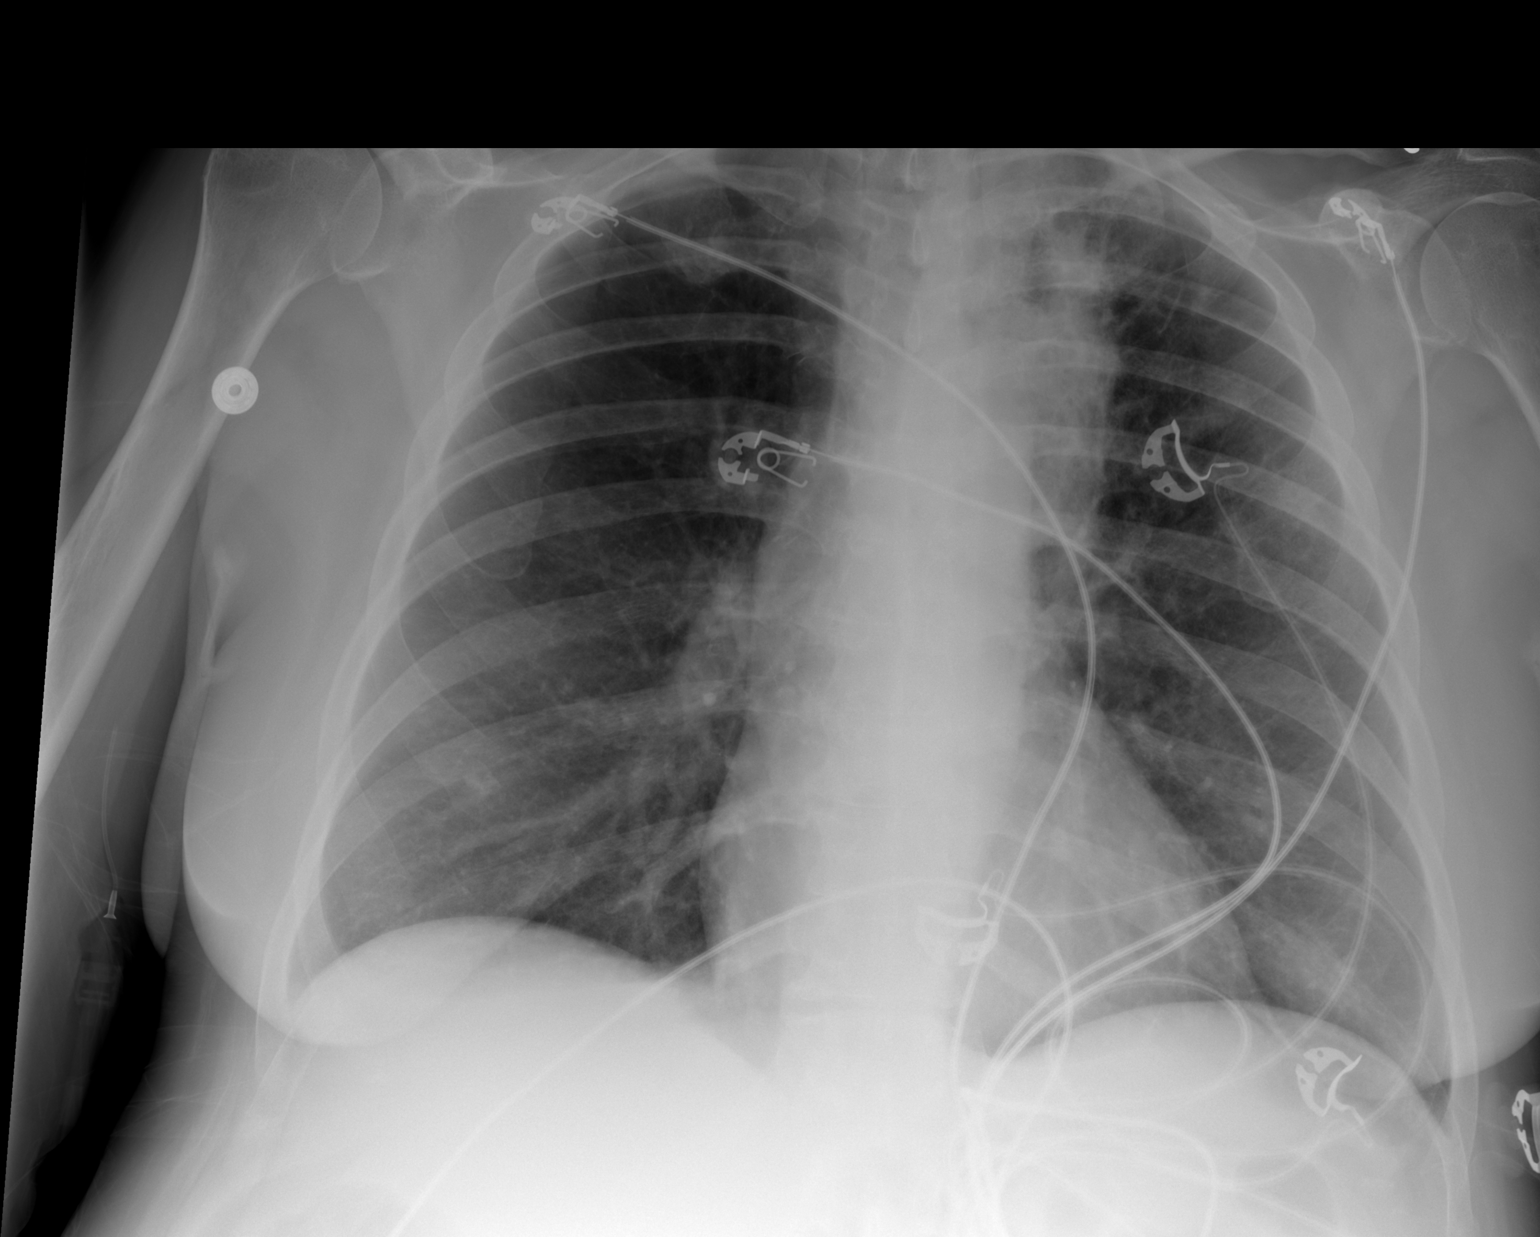

[2 of 2 positions shown; findings below may reference images not displayed]

FINDINGS: Cardiac size is unremarkable. No pulmonary edema. There is
infiltrate or infiltrative process in the left upper lobe suprahilar
region medially. Further correlation with enhanced CT scan of the
chest is recommended.
IMPRESSION: Infiltrate or infiltrative process in left upper lobe suprahilar
region. Further evaluation with enhanced CT scan of the chest is
recommended.

## 2016-10-07 IMAGING — CT CT ABD-PELV W/ CM
2 of 5 series · 13 of 36 positions shown, 16 images · IV contrast (OMNIPAQUE 300)
Comparison: Chest x-ray, same date.

CLINICAL DATA: Chest pain and bilateral flank pain. Abnormal chest
x-ray.

EXAM:
CT CHEST, ABDOMEN, AND PELVIS WITH CONTRAST
TECHNIQUE: Multidetector CT imaging of the chest, abdomen and pelvis was
performed following the standard protocol during bolus
administration of intravenous contrast.
CONTRAST:  25mL OMNIPAQUE IOHEXOL 300 MG/ML SOLN, 100mL OMNIPAQUE
IOHEXOL 300 MG/ML SOLN

[Series 2: c/a/p with · axial · 0.74mm/px · z∈[-444,+92]mm · 10 of 125 slices shown, 13 images]
[im 9/125  mediastinal]
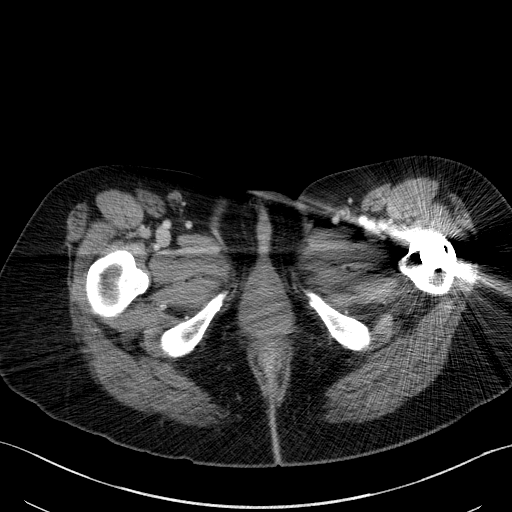
[im 9/125  lung]
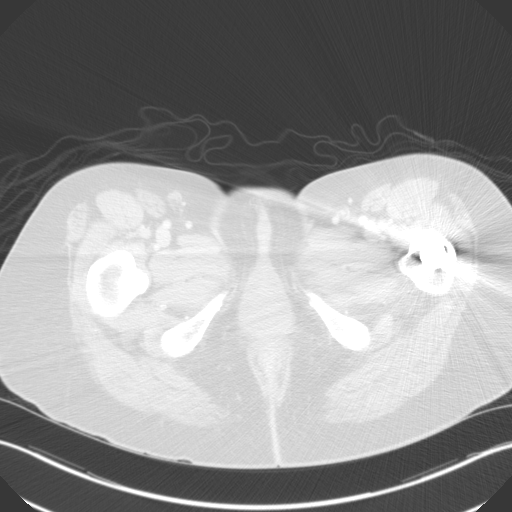
[im 25/125  lung]
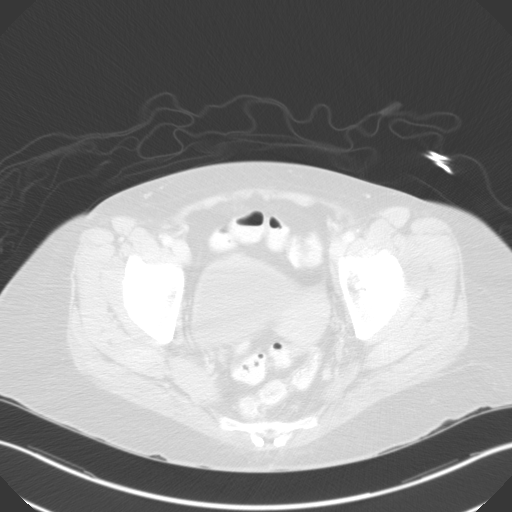
[im 34/125  lung]
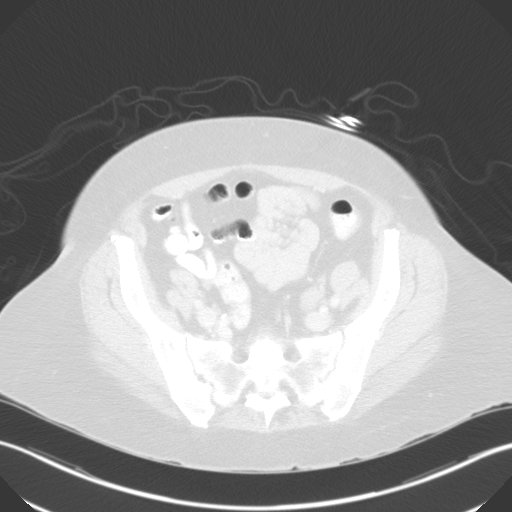
[im 42/125  lung]
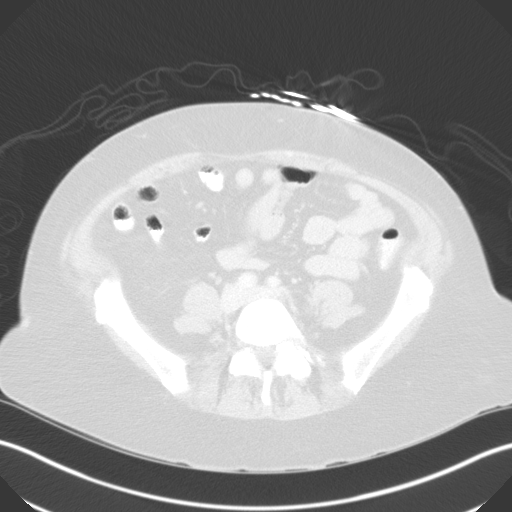
[im 58/125  mediastinal]
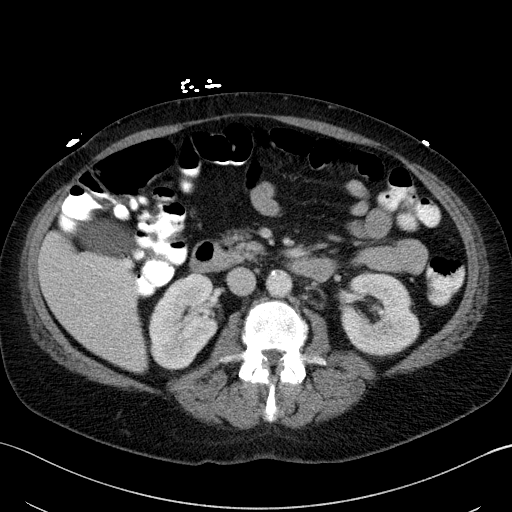
[im 58/125  lung]
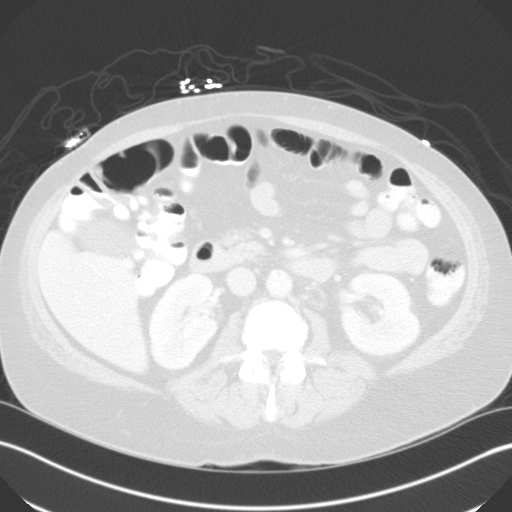
[im 67/125  lung]
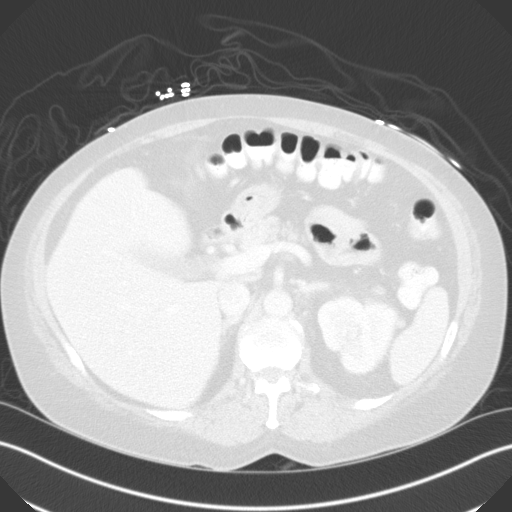
[im 83/125  lung]
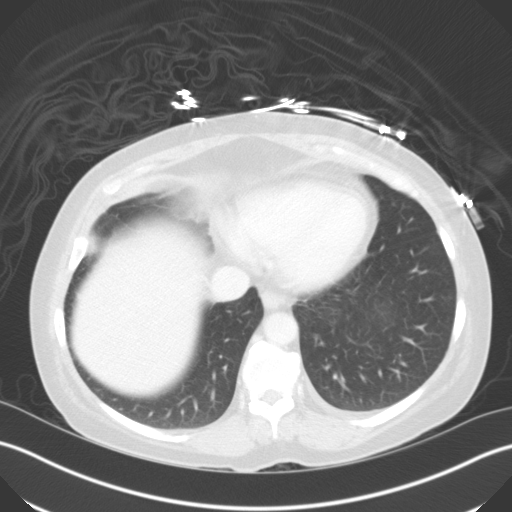
[im 91/125  lung]
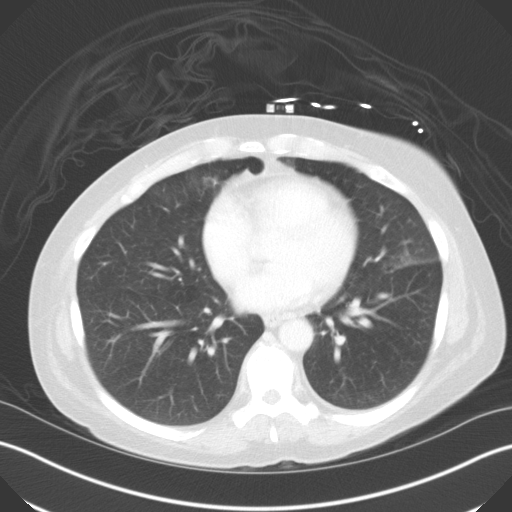
[im 100/125  mediastinal]
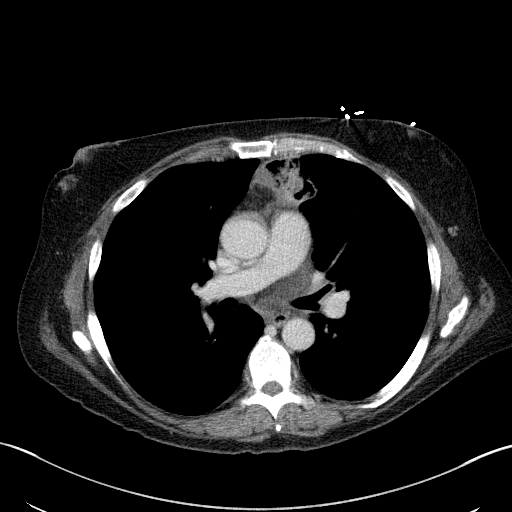
[im 100/125  lung]
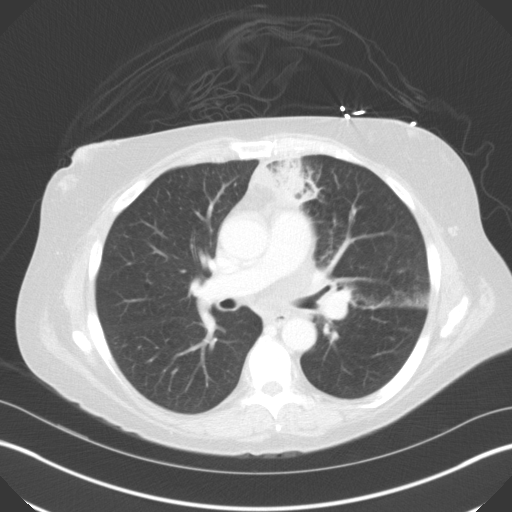
[im 116/125  lung]
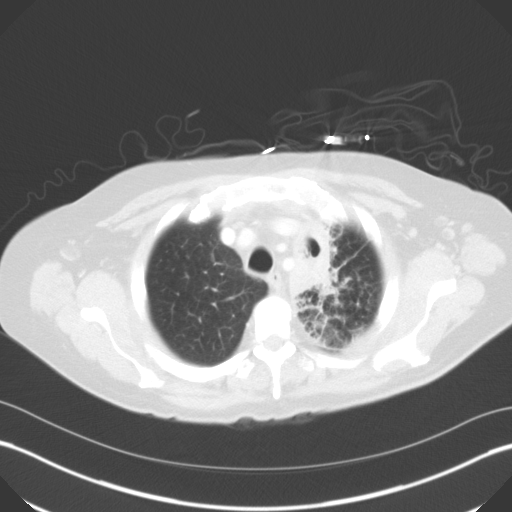

[Series 6: coronal · coronal · 0.80mm/px · 3 of 98 slices shown]
[im 20/98  lung]
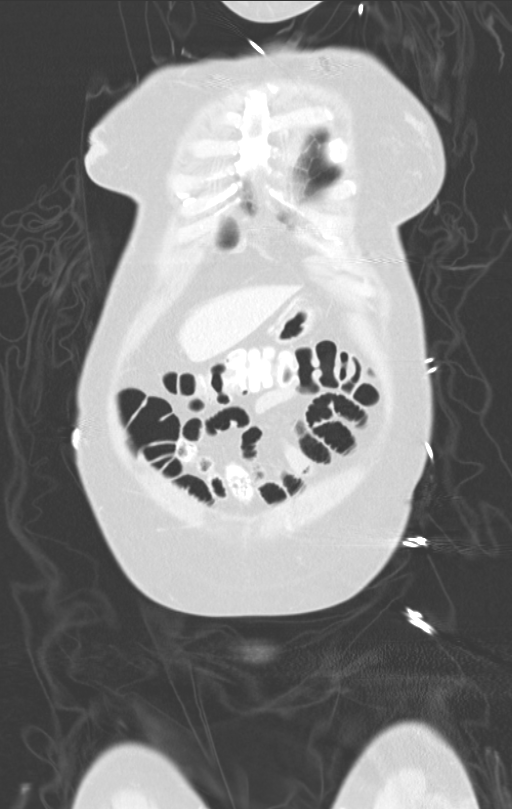
[im 39/98  lung]
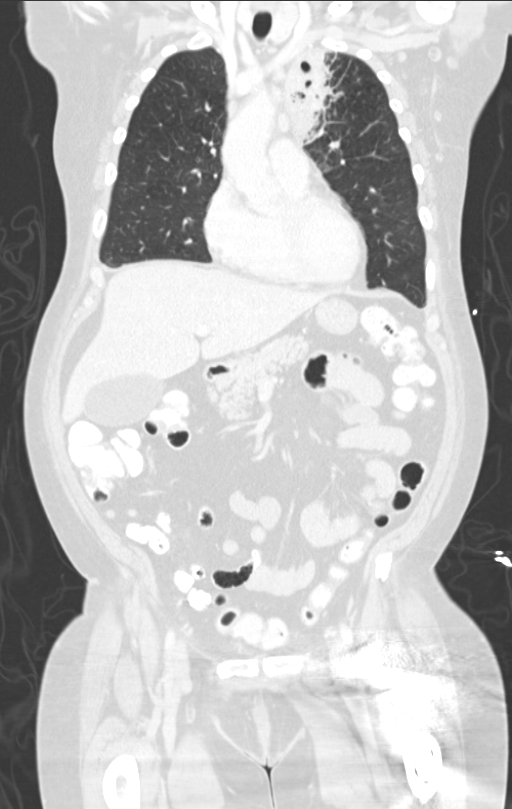
[im 59/98  lung]
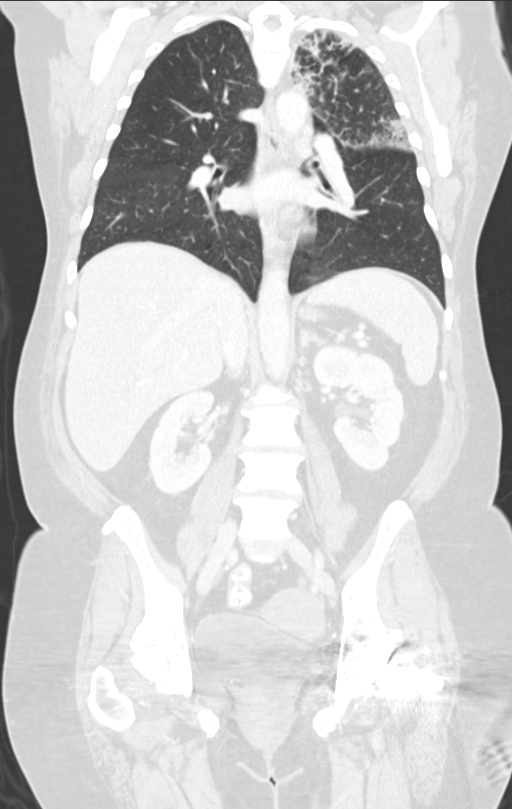

[13 of 36 positions shown; findings below may reference images not displayed]

FINDINGS: CT CHEST FINDINGS

Chest wall: No breast masses, supraclavicular or axillary
adenopathy. Asymmetric breast tissue noted on the left. Nodular
thyroid goiter noted. The bony thorax is intact. No destructive bone
lesions or spinal canal compromise.

Mediastinum: The heart is normal in size. No pericardial effusion.
Scattered mediastinal lymph nodes. 6 mm lymph node located between
the left brachiocephalic vein and the left carotid artery on image
12. 7 mm prevascular lymph node on image number 16. There are
smaller scattered pretracheal and aortic O pulmonary window nodes.

The aorta is normal in caliber. No dissection. The esophagus is
grossly normal.

Lungs/ pleura: Cavitary left upper lobe lung process is most likely
pneumonia with a lung abscess. There is significant surrounding
interstitial change in the left upper lobe. Somewhat patchy rounded
airspace opacity in the left upper lobe on image 21 is also likely
pneumonia and less likely an nodule. The remainder of the lungs are
clear. No pulmonary nodules or pleural effusion. There are
underlying emphysematous changes.

CT ABDOMEN AND PELVIS FINDINGS

Hepatobiliary: No focal hepatic lesions or intrahepatic biliary
dilatation. The gallbladder demonstrates a calcified gallstone. No
findings for acute cholecystitis. No common bile duct dilatation.

Pancreas: No mass, inflammation or ductal dilatation.

Spleen: Normal size.  No focal lesions.

Adrenals/Urinary Tract: The adrenal glands and kidneys are
unremarkable. There are scarring changes involving the upper pole
region of the left kidney.

Stomach/Bowel: The stomach, duodenum, small bowel and colon are
unremarkable. No inflammatory changes, mass lesions or obstructive
findings. The terminal ileum is normal. The appendix is normal.

Vascular/Lymphatic: No mesenteric or retroperitoneal mass or
adenopathy. The aorta is normal in caliber. Moderate scattered
atherosclerotic calcifications. The branch vessels are patent. The
major venous structures are patent.

Other: The uterus and ovaries are unremarkable. A small calcified
fibroid is noted. The bladder is normal. No pelvic mass, adenopathy
or free pelvic fluid collections. No inguinal mass or adenopathy.

Musculoskeletal: No significant bony findings. A left hip prosthesis
is noted.
IMPRESSION: Extensive necrotizing left upper lobe pneumonia with
cavitations/lung abscesses. Adjacent inflammatory mediastinal lymph
nodes.

Mild underlying emphysematous changes.

No acute abdominal/ pelvic findings, mass lesions or adenopathy.

Cholelithiasis.
